# Patient Record
Sex: Male | Born: 1980
Health system: Southern US, Community
[De-identification: ages and names within clinical notes are randomized; demographics above are authoritative.]

## PROBLEM LIST (undated history)

## (undated) DIAGNOSIS — E119 Type 2 diabetes mellitus without complications: Secondary | ICD-10-CM

## (undated) DIAGNOSIS — K219 Gastro-esophageal reflux disease without esophagitis: Secondary | ICD-10-CM

## (undated) DIAGNOSIS — E785 Hyperlipidemia, unspecified: Secondary | ICD-10-CM

## (undated) DIAGNOSIS — I10 Essential (primary) hypertension: Secondary | ICD-10-CM

## (undated) HISTORY — DX: Type 2 diabetes mellitus without complications: E11.9

## (undated) HISTORY — PX: OTHER SURGICAL HISTORY: SHX169

## (undated) HISTORY — DX: Hyperlipidemia, unspecified: E78.5

## (undated) HISTORY — PX: APPENDECTOMY: SHX54

---
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
## 1999-04-10 ENCOUNTER — Emergency Department (HOSPITAL_COMMUNITY): Admission: EM | Admit: 1999-04-10 | Discharge: 1999-04-10 | Payer: Self-pay | Admitting: Emergency Medicine

## 1999-04-10 ENCOUNTER — Encounter: Payer: Self-pay | Admitting: Emergency Medicine

## 2001-11-17 ENCOUNTER — Emergency Department (HOSPITAL_COMMUNITY): Admission: EM | Admit: 2001-11-17 | Discharge: 2001-11-17 | Payer: Self-pay | Admitting: Emergency Medicine

## 2001-11-17 ENCOUNTER — Encounter: Payer: Self-pay | Admitting: Emergency Medicine

## 2010-06-28 ENCOUNTER — Emergency Department (HOSPITAL_COMMUNITY): Payer: PRIVATE HEALTH INSURANCE

## 2010-06-28 ENCOUNTER — Emergency Department (HOSPITAL_COMMUNITY)
Admission: EM | Admit: 2010-06-28 | Discharge: 2010-06-28 | Disposition: A | Discharge status: Home / Self Care | Payer: PRIVATE HEALTH INSURANCE | Attending: Emergency Medicine | Admitting: Emergency Medicine

## 2010-06-28 DIAGNOSIS — X58XXXA Exposure to other specified factors, initial encounter: Secondary | ICD-10-CM | POA: Insufficient documentation

## 2010-06-28 DIAGNOSIS — S838X9A Sprain of other specified parts of unspecified knee, initial encounter: Secondary | ICD-10-CM | POA: Insufficient documentation

## 2010-06-28 DIAGNOSIS — S86819A Strain of other muscle(s) and tendon(s) at lower leg level, unspecified leg, initial encounter: Secondary | ICD-10-CM | POA: Insufficient documentation

## 2010-06-28 DIAGNOSIS — Y9302 Activity, running: Secondary | ICD-10-CM | POA: Insufficient documentation

## 2010-06-28 DIAGNOSIS — M25569 Pain in unspecified knee: Secondary | ICD-10-CM | POA: Insufficient documentation

## 2010-08-16 ENCOUNTER — Emergency Department (HOSPITAL_COMMUNITY)
Admission: EM | Admit: 2010-08-16 | Discharge: 2010-08-17 | Disposition: A | Discharge status: Home / Self Care | Payer: PRIVATE HEALTH INSURANCE | Attending: Emergency Medicine | Admitting: Emergency Medicine

## 2010-08-16 ENCOUNTER — Emergency Department (HOSPITAL_COMMUNITY): Payer: PRIVATE HEALTH INSURANCE

## 2010-08-16 DIAGNOSIS — W269XXA Contact with unspecified sharp object(s), initial encounter: Secondary | ICD-10-CM | POA: Insufficient documentation

## 2010-08-16 DIAGNOSIS — Y93G1 Activity, food preparation and clean up: Secondary | ICD-10-CM | POA: Insufficient documentation

## 2010-08-16 DIAGNOSIS — S61209A Unspecified open wound of unspecified finger without damage to nail, initial encounter: Secondary | ICD-10-CM | POA: Insufficient documentation

## 2012-08-26 LAB — HM DIABETES EYE EXAM

## 2013-05-16 ENCOUNTER — Ambulatory Visit: Payer: PRIVATE HEALTH INSURANCE | Admitting: Physician Assistant

## 2013-05-17 ENCOUNTER — Ambulatory Visit: Payer: PRIVATE HEALTH INSURANCE | Admitting: Physician Assistant

## 2013-05-27 ENCOUNTER — Encounter: Payer: Self-pay | Admitting: Internal Medicine

## 2013-05-27 ENCOUNTER — Ambulatory Visit (INDEPENDENT_AMBULATORY_CARE_PROVIDER_SITE_OTHER): Payer: PRIVATE HEALTH INSURANCE | Admitting: Internal Medicine

## 2013-05-27 ENCOUNTER — Other Ambulatory Visit (INDEPENDENT_AMBULATORY_CARE_PROVIDER_SITE_OTHER): Payer: PRIVATE HEALTH INSURANCE

## 2013-05-27 VITALS — BP 138/80 | HR 108 | Temp 97.0°F | Resp 16 | Ht 72.0 in | Wt 215.0 lb

## 2013-05-27 DIAGNOSIS — E119 Type 2 diabetes mellitus without complications: Secondary | ICD-10-CM

## 2013-05-27 DIAGNOSIS — E139 Other specified diabetes mellitus without complications: Secondary | ICD-10-CM | POA: Insufficient documentation

## 2013-05-27 LAB — CBC WITH DIFFERENTIAL/PLATELET
Basophils Absolute: 0 10*3/uL (ref 0.0–0.1)
Basophils Relative: 0.4 % (ref 0.0–3.0)
EOS ABS: 0 10*3/uL (ref 0.0–0.7)
Eosinophils Relative: 0.3 % (ref 0.0–5.0)
HCT: 45.3 % (ref 39.0–52.0)
Hemoglobin: 15.3 g/dL (ref 13.0–17.0)
Lymphocytes Relative: 31.6 % (ref 12.0–46.0)
Lymphs Abs: 2.7 10*3/uL (ref 0.7–4.0)
MCHC: 33.7 g/dL (ref 30.0–36.0)
MCV: 90.6 fl (ref 78.0–100.0)
Monocytes Absolute: 0.7 10*3/uL (ref 0.1–1.0)
Monocytes Relative: 8.2 % (ref 3.0–12.0)
NEUTROS PCT: 59.5 % (ref 43.0–77.0)
Neutro Abs: 5.1 10*3/uL (ref 1.4–7.7)
PLATELETS: 299 10*3/uL (ref 150.0–400.0)
RBC: 5 Mil/uL (ref 4.22–5.81)
RDW: 12.3 % (ref 11.5–14.6)
WBC: 8.6 10*3/uL (ref 4.5–10.5)

## 2013-05-27 LAB — URINALYSIS, ROUTINE W REFLEX MICROSCOPIC
Bilirubin Urine: NEGATIVE
HGB URINE DIPSTICK: NEGATIVE
Ketones, ur: 40 — AB
Leukocytes, UA: NEGATIVE
NITRITE: NEGATIVE
SPECIFIC GRAVITY, URINE: 1.01 (ref 1.000–1.030)
TOTAL PROTEIN, URINE-UPE24: NEGATIVE
Urine Glucose: >=1000 — AB
Urobilinogen, UA: 0.2 (ref 0.0–1.0)
pH: 6 (ref 5.0–8.0)

## 2013-05-27 LAB — COMPREHENSIVE METABOLIC PANEL
ALBUMIN: 4.1 g/dL (ref 3.5–5.2)
ALT: 33 U/L (ref 0–53)
AST: 18 U/L (ref 0–37)
Alkaline Phosphatase: 112 U/L (ref 39–117)
BUN: 13 mg/dL (ref 6–23)
CO2: 25 mEq/L (ref 19–32)
Calcium: 9.6 mg/dL (ref 8.4–10.5)
Chloride: 98 mEq/L (ref 96–112)
Creatinine, Ser: 0.9 mg/dL (ref 0.4–1.5)
GFR: 127.34 mL/min (ref >60.00)
GLUCOSE: 385 mg/dL — AB (ref 70–99)
POTASSIUM: 3.9 meq/L (ref 3.5–5.1)
SODIUM: 133 meq/L — AB (ref 135–145)
TOTAL PROTEIN: 8 g/dL (ref 6.0–8.3)
Total Bilirubin: 0.8 mg/dL (ref 0.3–1.2)

## 2013-05-27 LAB — LIPID PANEL
Cholesterol: 242 mg/dL — ABNORMAL HIGH (ref 0–200)
HDL: 38.4 mg/dL — ABNORMAL LOW (ref >39.00)
LDL Cholesterol: 170 mg/dL — ABNORMAL HIGH (ref 0–99)
Total CHOL/HDL Ratio: 6
Triglycerides: 166 mg/dL — ABNORMAL HIGH (ref 0.0–149.0)
VLDL: 33.2 mg/dL (ref 0.0–40.0)

## 2013-05-27 LAB — HEMOGLOBIN A1C: Hgb A1c MFr Bld: 17.9 % — ABNORMAL HIGH (ref 4.6–6.5)

## 2013-05-27 LAB — TSH: TSH: 1.14 u[IU]/mL (ref 0.35–5.50)

## 2013-05-27 MED ORDER — BAYER CONTOUR NEXT EZ W/DEVICE KIT: 1.0000 | PACK | Freq: Three times a day (TID) | Status: DC

## 2013-05-27 MED ORDER — GLUCOSE BLOOD VI STRP: ORAL_STRIP | Status: DC

## 2013-05-27 MED ORDER — INSULIN LISPRO PROT & LISPRO (75-25 MIX) 100 UNIT/ML KWIKPEN: 40.0000 [IU] | PEN_INJECTOR | Freq: Two times a day (BID) | SUBCUTANEOUS | Status: DC

## 2013-05-27 NOTE — Progress Notes (Signed)
   Subjective:    Patient ID: Joseph [name], male    DOB: 06-18-1980, 33 y.o.   MRN: [id number]  Diabetes He presents for his initial diabetic visit. He has type 1 diabetes mellitus. The initial diagnosis of diabetes was made 3 months ago. His disease course has been worsening. Pertinent negatives for hypoglycemia include no dizziness, headaches or tremors. Associated symptoms include polydipsia, polyphagia, polyuria and weight loss. Pertinent negatives for diabetes include no blurred vision, no chest pain, no fatigue, no foot paresthesias, no foot ulcerations, no visual change and no weakness. There are no hypoglycemic complications. There are no diabetic complications. When asked about current treatments, none were reported. His weight is decreasing steadily. He is following a generally healthy diet. Meal planning includes avoidance of concentrated sweets. An ACE inhibitor/angiotensin II receptor blocker is not being taken. He does not see a podiatrist.Eye exam is not current.      Review of Systems  Constitutional: Positive for weight loss and unexpected weight change. Negative for fever, chills, diaphoresis, activity change, appetite change and fatigue.  HENT: Negative.   Eyes: Negative.  Negative for blurred vision.  Respiratory: Negative.  Negative for cough, choking, chest tightness, shortness of breath, wheezing and stridor.   Cardiovascular: Negative.  Negative for chest pain, palpitations and leg swelling.  Gastrointestinal: Negative.  Negative for nausea, vomiting, abdominal pain, diarrhea and constipation.  Endocrine: Positive for polydipsia, polyphagia and polyuria.  Genitourinary: Positive for frequency. Negative for dysuria, urgency, hematuria, flank pain, decreased urine volume, enuresis and difficulty urinating.  Musculoskeletal: Negative.   Skin: Negative.   Allergic/Immunologic: Negative.   Neurological: Negative.  Negative for dizziness, tremors, facial asymmetry, weakness,  light-headedness, numbness and headaches.  Hematological: Negative.  Negative for adenopathy. Does not bruise/bleed easily.  Psychiatric/Behavioral: Negative.        Objective:   Physical Exam  Vitals reviewed. Constitutional: He is oriented to person, place, and time. He appears well-developed and well-nourished. No distress.  HENT:  Head: Normocephalic and atraumatic.  Mouth/Throat: Oropharynx is clear and moist. No oropharyngeal exudate.  Eyes: Conjunctivae are normal. Right eye exhibits no discharge. Left eye exhibits no discharge. No scleral icterus.  Neck: Normal range of motion. Neck supple. No JVD present. No tracheal deviation present. No thyromegaly present.  Cardiovascular: Normal rate, regular rhythm, normal heart sounds and intact distal pulses.  Exam reveals no gallop and no friction rub.   No murmur heard. Pulmonary/Chest: Effort normal and breath sounds normal. No stridor. No respiratory distress. He has no wheezes. He has no rales. He exhibits no tenderness.  Abdominal: Soft. Bowel sounds are normal. He exhibits no distension and no mass. There is no tenderness. There is no rebound and no guarding.  Musculoskeletal: Normal range of motion. He exhibits no edema and no tenderness.  Lymphadenopathy:    He has no cervical adenopathy.  Neurological: He is oriented to person, place, and time.  Skin: Skin is warm and dry. No rash noted. He is not diaphoretic. No erythema. No pallor.  Psychiatric: He has a normal mood and affect. His behavior is normal. Judgment and thought content normal.     No results found for this basename: WBC, HGB, HCT, PLT, GLUCOSE, CHOL, TRIG, HDL, LDLDIRECT, LDLCALC, ALT, AST, NA, K, CL, CREATININE, BUN, CO2, TSH, PSA, INR, GLUF, HGBA1C, MICROALBUR       Assessment & Plan:

## 2013-05-27 NOTE — Progress Notes (Signed)
Pre visit review using our clinic review tool, if applicable. No additional management support is needed unless otherwise documented below in the visit note. 

## 2013-05-27 NOTE — Assessment & Plan Note (Signed)
-   his blood sugar is a 406 so I asked him to be admitted for blood sugar control but he refused that - I gave him his first dose of insulin in the office today and I showed him how to give himself insulin - I also gave him 2 machines to check his blood sugar and we showed him how to use the device - I will check his labs to monitor his lytes and renal function and a c-peptide to see if he is more off a type 1 or a type 2 - he was referred for an eye exam and for diabetic education

## 2013-05-27 NOTE — Patient Instructions (Signed)
Type 1 Diabetes Mellitus, Adult Type 1 diabetes mellitus, often simply referred to as diabetes, is a long-term (chronic) disease. It occurs when the islet cells in the pancreas that make insulin (a hormone) are destroyed and can no longer make insulin. Insulin is needed to move sugars from food into the tissue cells. The tissue cells use the sugars for energy. In people with type 1 diabetes, the sugars build up in the blood instead of going into the tissue cells. As a result, high blood sugar (hyperglycemia) develops. Without insulin, the body breaks down fat cells for the needed energy. This breakdown of fat cells produces acid chemicals (ketones), which increases the acid levels in the body. The effect of either high ketone or sugar (glucose) levels can be life-threatening.  Type 1 diabetes was also previously called juvenile diabetes. It most often occurs before the age of 30, but it can occur at any age. RISK FACTORS A person is predisposed to developing type 1 diabetes if someone in his or her family has the disease and is exposed to certain additional environmental triggers.  SYMPTOMS  Symptoms of type 1 diabetes may develop gradually over days to weeks or suddenly. The symptoms occur due to hyperglycemia. The symptoms can include:   Increased thirst (polydipsia).  Increased urination (polyuria).  Increased urination during the night (nocturia).  Weight loss. This weight loss may be rapid.  Frequent, recurring infections.  Tiredness (fatigue).  Weakness.  Vision changes, such as blurred vision.  Fruity smell to your breath.  Abdominal pain.  Nausea or vomiting. DIAGNOSIS  Type 1 diabetes is diagnosed when symptoms of diabetes are present and when blood glucose levels are increased. Your blood glucose level may be checked by one or more of the following blood tests:  A fasting blood glucose test. You will not be allowed to eat for at least 8 hours before a blood sample is  taken.  A random blood glucose test. Your blood glucose is checked at any time of the day regardless of when you ate.  A hemoglobin A1c blood glucose test. A hemoglobin A1c test provides information about blood glucose control over the previous 3 months. TREATMENT  Although type 1 diabetes cannot be prevented, it can be managed with insulin, diet, and exercise.  You will need to take insulin daily to keep blood glucose in the desired range.  You will need to match insulin dosing with exercise and healthy food choices. The treatment goal is to maintain the before-meal blood sugar (preprandial glucose) level at 70 130 mg/dL.  HOME CARE INSTRUCTIONS   Have your hemoglobin A1c level checked twice a year.  Perform daily blood glucose monitoring as directed by your caregiver.  Monitor urine ketones when you are ill and as directed by your caregiver.  Take your insulin as directed by your caregiver to maintain your blood glucose level in the desired range.  Never run out of insulin. It is needed every day.  Adjust insulin based on your intake of carbohydrates. Carbohydrates can raise blood glucose levels but need to be included in your diet. Carbohydrates provide vitamins, minerals, and fiber, which are an essential part of a healthy diet. Carbohydrates are found in fruits, vegetables, whole grains, dairy products, legumes, and foods containing added sugars.    Eat healthy foods. Alternate 3 meals with 3 snacks.  Maintain a healthy weight.  Carry a medical alert card or wear your medical alert jewelry.  Carry a 15 gram carbohydrate snack with you   at all times to treat low blood glucose (hypoglycemia). Some examples of 15 gram carbohydrate snacks include:  Glucose tablets, 3 or 4.   Glucose gel, 15 gram tube.  Raisins, 2 tablespoons (24 grams).  Jelly beans, 6.  Animal crackers, 8.  Fruit juice, regular soda, or low-fat milk, 4 ounces (120 mL).  Gummy treats,  9.    Recognize hypoglycemia. Hypoglycemia occurs with blood glucose levels of 70 mg/dL and below. The risk for hypoglycemia increases when fasting or skipping meals, during or after intense exercise, and during sleep. Hypoglycemia symptoms can include:  Tremors or shakes.  Decreased ability to concentrate.  Sweating.  Increased heart rate.  Headache.  Dry mouth.  Hunger.  Irritability.  Anxiety.  Restless sleep.  Altered speech or coordination.  Confusion.  Treat hypoglycemia promptly. If you are alert and able to safely swallow, follow the 15:15 rule:  Take 15 20 grams of rapid-acting glucose or carbohydrate. Rapid-acting options include glucose gel, glucose tablets, or 4 ounces (120 mL) of fruit juice, regular soda, or low-fat milk.  Check your blood glucose level 15 minutes after taking the glucose.   Take 15 20 grams more of glucose if the repeat blood glucose level is still 70 mg/dL or below.  Eat a meal or snack within 1 hour once blood glucose levels return to normal.  Be alert to polyuria and polydipsia, which are early signs of hyperglycemia. An early awareness of hyperglycemia allows for prompt treatment. Treat hyperglycemia as directed by your caregiver.  Engage in at least 150 minutes of moderate-intensity physical activity a week, spread over at least 3 days of the week or as directed by your caregiver.  Adjust your insulin dosing and food intake as needed if you start a new exercise or sport.  Follow your sick day plan at any time you are unable to eat or drink as usual.   Avoid tobacco use.  Limit alcohol intake to no more than 1 drink per day for nonpregnant women and 2 drinks per day for men. You should drink alcohol only when you are also eating food. Talk with your caregiver about whether alcohol is safe for you. Tell your caregiver if you drink alcohol several times a week.  Follow up with your caregiver regularly.  Schedule an eye exam  within 5 years of diagnosis and then annually.  Perform daily skin and foot care. Examine your skin and feet daily for cuts, bruises, redness, nail problems, bleeding, blisters, or sores. A foot exam by a caregiver should be done annually.  Brush your teeth and gums at least twice a day and floss at least once a day. Follow up with your dentist regularly.  Share your diabetes management plan with your workplace or school.  Stay up-to-date with immunizations.  Learn to manage stress.  Obtain ongoing diabetes education and support as needed.  Participate or seek rehabilitation as needed to maintain or improve independence and quality of life. Request a physical or occupational therapy referral if you are having foot or hand numbness or difficulties with grooming, dressing, eating, or physical activity. SEEK MEDICAL CARE IF:   You are unable to eat food or drink fluids for more than 6 hours.  You have nausea and vomiting for more than 6 hours.  Your blood glucose level is over 240 mg/dL.  There is a change in mental status.  You develop an additional serious illness.  You have diarrhea for more than 6 hours.  You have been   sick or have had a fever for a couple of days and are not getting better.  You have pain during any physical activity. SEEK IMMEDIATE MEDICAL CARE IF:  You have difficulty breathing.  You have moderate to large ketone levels. MAKE SURE YOU:  Understand these instructions.  Will watch your condition.  Will get help right away if you are not doing well or get worse. Document Released: 02/01/2000 Document Revised: 10/29/2011 Document Reviewed: 09/02/2011 [employer] Patient Information 2014 [employer].  

## 2013-05-28 LAB — C-PEPTIDE: C PEPTIDE: 0.95 ng/mL (ref 0.80–3.90)

## 2013-05-30 ENCOUNTER — Telehealth: Payer: Self-pay | Admitting: Internal Medicine

## 2013-05-30 NOTE — Telephone Encounter (Signed)
Relevant patient education assigned to patient using Emmi. ° °

## 2013-06-01 ENCOUNTER — Encounter: Payer: Self-pay | Admitting: Internal Medicine

## 2013-06-01 ENCOUNTER — Ambulatory Visit (INDEPENDENT_AMBULATORY_CARE_PROVIDER_SITE_OTHER): Payer: PRIVATE HEALTH INSURANCE | Admitting: Internal Medicine

## 2013-06-01 VITALS — BP 120/82 | HR 88 | Temp 97.9°F | Resp 16 | Ht 72.0 in | Wt 223.0 lb

## 2013-06-01 DIAGNOSIS — E785 Hyperlipidemia, unspecified: Secondary | ICD-10-CM | POA: Insufficient documentation

## 2013-06-01 DIAGNOSIS — Z23 Encounter for immunization: Secondary | ICD-10-CM

## 2013-06-01 DIAGNOSIS — E139 Other specified diabetes mellitus without complications: Secondary | ICD-10-CM

## 2013-06-01 DIAGNOSIS — E119 Type 2 diabetes mellitus without complications: Secondary | ICD-10-CM

## 2013-06-01 MED ORDER — ROSUVASTATIN CALCIUM 10 MG PO TABS: 10.0000 mg | ORAL_TABLET | Freq: Every day | ORAL | Status: DC

## 2013-06-01 MED ORDER — CANAGLIFLOZIN 300 MG PO TABS: 1.0000 | ORAL_TABLET | Freq: Every day | ORAL | Status: DC

## 2013-06-01 MED ORDER — INSULIN PEN NEEDLE 31G X 5 MM MISC: Status: DC

## 2013-06-01 NOTE — Assessment & Plan Note (Signed)
Will start a statin for this

## 2013-06-01 NOTE — Progress Notes (Signed)
Pre visit review using our clinic review tool, if applicable. No additional management support is needed unless otherwise documented below in the visit note. 

## 2013-06-01 NOTE — Progress Notes (Signed)
   Subjective:    Patient ID: Joseph [name], male    DOB: 12-20-1980, 33 y.o.   MRN: [id number]  HPI Comments: He returns for f/up after a new diagnosis of DM1. He feels better and has been able to put a few pounds back on. His blood sugars have been down to the 176-243 range.     Review of Systems  Constitutional: Negative.  Negative for fever, chills, diaphoresis, appetite change and fatigue.  HENT: Negative.   Eyes: Negative.   Respiratory: Negative.  Negative for cough, choking, chest tightness, shortness of breath, wheezing and stridor.   Cardiovascular: Negative.  Negative for chest pain, palpitations and leg swelling.  Gastrointestinal: Negative.  Negative for nausea, vomiting, abdominal pain, diarrhea, constipation and blood in stool.  Endocrine: Negative.  Negative for polydipsia, polyphagia and polyuria.  Genitourinary: Negative.   Musculoskeletal: Negative.   Skin: Negative.   Allergic/Immunologic: Negative.   Neurological: Negative.  Negative for dizziness, tremors, speech difficulty, weakness, light-headedness, numbness and headaches.  Hematological: Negative.  Negative for adenopathy. Does not bruise/bleed easily.  Psychiatric/Behavioral: Negative.        Objective:   Physical Exam  Vitals reviewed. Constitutional: He is oriented to person, place, and time. He appears well-developed and well-nourished. No distress.  HENT:  Head: Normocephalic and atraumatic.  Mouth/Throat: Oropharynx is clear and moist. No oropharyngeal exudate.  Eyes: Conjunctivae are normal. Right eye exhibits no discharge. Left eye exhibits no discharge. No scleral icterus.  Neck: Normal range of motion. Neck supple. No JVD present. No tracheal deviation present. No thyromegaly present.  Cardiovascular: Normal rate, regular rhythm, normal heart sounds and intact distal pulses.  Exam reveals no gallop and no friction rub.   No murmur heard. Pulmonary/Chest: Effort normal and breath sounds normal.  No stridor. No respiratory distress. He has no wheezes. He has no rales. He exhibits no tenderness.  Abdominal: Soft. Bowel sounds are normal. He exhibits no distension and no mass. There is no tenderness. There is no rebound and no guarding.  Musculoskeletal: Normal range of motion. He exhibits no edema and no tenderness.  Lymphadenopathy:    He has no cervical adenopathy.  Neurological: He is oriented to person, place, and time.  Skin: Skin is warm and dry. No rash noted. He is not diaphoretic. No erythema. No pallor.    Lab Results  Component Value Date   WBC 8.6 05/27/2013   HGB 15.3 05/27/2013   HCT 45.3 05/27/2013   PLT 299.0 05/27/2013   GLUCOSE 385* 05/27/2013   CHOL 242* 05/27/2013   TRIG 166.0* 05/27/2013   HDL 38.40* 05/27/2013   LDLCALC 170* 05/27/2013   ALT 33 05/27/2013   AST 18 05/27/2013   NA 133* 05/27/2013   K 3.9 05/27/2013   CL 98 05/27/2013   CREATININE 0.9 05/27/2013   BUN 13 05/27/2013   CO2 25 05/27/2013   TSH 1.14 05/27/2013   HGBA1C 17.9* 05/27/2013        Assessment & Plan:

## 2013-06-01 NOTE — Patient Instructions (Signed)
Type 2 Diabetes Mellitus, Adult Type 2 diabetes mellitus, often simply referred to as type 2 diabetes, is a long-lasting (chronic) disease. In type 2 diabetes, the pancreas does not make enough insulin (a hormone), the cells are less responsive to the insulin that is made (insulin resistance), or both. Normally, insulin moves sugars from food into the tissue cells. The tissue cells use the sugars for energy. The lack of insulin or the lack of normal response to insulin causes excess sugars to build up in the blood instead of going into the tissue cells. As a result, high blood sugar (hyperglycemia) develops. The effect of high sugar (glucose) levels can cause many complications. Type 2 diabetes was also previously called adult-onset diabetes but it can occur at any age.  RISK FACTORS  A person is predisposed to developing type 2 diabetes if someone in the family has the disease and also has one or more of the following primary risk factors:  Overweight.  An inactive lifestyle.  A history of consistently eating high-calorie foods. Maintaining a normal weight and regular physical activity can reduce the chance of developing type 2 diabetes. SYMPTOMS  A person with type 2 diabetes may not show symptoms initially. The symptoms of type 2 diabetes appear slowly. The symptoms include:  Increased thirst (polydipsia).  Increased urination (polyuria).  Increased urination during the night (nocturia).  Weight loss. This weight loss may be rapid.  Frequent, recurring infections.  Tiredness (fatigue).  Weakness.  Vision changes, such as blurred vision.  Fruity smell to your breath.  Abdominal pain.  Nausea or vomiting.  Cuts or bruises which are slow to heal.  Tingling or numbness in the hands or feet. DIAGNOSIS Type 2 diabetes is frequently not diagnosed until complications of diabetes are present. Type 2 diabetes is diagnosed when symptoms or complications are present and when blood  glucose levels are increased. Your blood glucose level may be checked by one or more of the following blood tests:  A fasting blood glucose test. You will not be allowed to eat for at least 8 hours before a blood sample is taken.  A random blood glucose test. Your blood glucose is checked at any time of the day regardless of when you ate.  A hemoglobin A1c blood glucose test. A hemoglobin A1c test provides information about blood glucose control over the previous 3 months.  An oral glucose tolerance test (OGTT). Your blood glucose is measured after you have not eaten (fasted) for 2 hours and then after you drink a glucose-containing beverage. TREATMENT   You may need to take insulin or diabetes medicine daily to keep blood glucose levels in the desired range.  You will need to match insulin dosing with exercise and healthy food choices. The treatment goal is to maintain the before meal blood sugar (preprandial glucose) level at 70 130 mg/dL. HOME CARE INSTRUCTIONS   Have your hemoglobin A1c level checked twice a year.  Perform daily blood glucose monitoring as directed by your caregiver.  Monitor urine ketones when you are ill and as directed by your caregiver.  Take your diabetes medicine or insulin as directed by your caregiver to maintain your blood glucose levels in the desired range.  Never run out of diabetes medicine or insulin. It is needed every day.  Adjust insulin based on your intake of carbohydrates. Carbohydrates can raise blood glucose levels but need to be included in your diet. Carbohydrates provide vitamins, minerals, and fiber which are an essential part of   a healthy diet. Carbohydrates are found in fruits, vegetables, whole grains, dairy products, legumes, and foods containing added sugars.    Eat healthy foods. Alternate 3 meals with 3 snacks.  Lose weight if overweight.  Carry a medical alert card or wear your medical alert jewelry.  Carry a 15 gram  carbohydrate snack with you at all times to treat low blood glucose (hypoglycemia). Some examples of 15 gram carbohydrate snacks include:  Glucose tablets, 3 or 4   Glucose gel, 15 gram tube  Raisins, 2 tablespoons (24 grams)  Jelly beans, 6  Animal crackers, 8  Regular pop, 4 ounces (120 mL)  Gummy treats, 9  Recognize hypoglycemia. Hypoglycemia occurs with blood glucose levels of 70 mg/dL and below. The risk for hypoglycemia increases when fasting or skipping meals, during or after intense exercise, and during sleep. Hypoglycemia symptoms can include:  Tremors or shakes.  Decreased ability to concentrate.  Sweating.  Increased heart rate.  Headache.  Dry mouth.  Hunger.  Irritability.  Anxiety.  Restless sleep.  Altered speech or coordination.  Confusion.  Treat hypoglycemia promptly. If you are alert and able to safely swallow, follow the 15:15 rule:  Take 15 20 grams of rapid-acting glucose or carbohydrate. Rapid-acting options include glucose gel, glucose tablets, or 4 ounces (120 mL) of fruit juice, regular soda, or low fat milk.  Check your blood glucose level 15 minutes after taking the glucose.  Take 15 20 grams more of glucose if the repeat blood glucose level is still 70 mg/dL or below.  Eat a meal or snack within 1 hour once blood glucose levels return to normal.    Be alert to polyuria and polydipsia which are early signs of hyperglycemia. An early awareness of hyperglycemia allows for prompt treatment. Treat hyperglycemia as directed by your caregiver.  Engage in at least 150 minutes of moderate-intensity physical activity a week, spread over at least 3 days of the week or as directed by your caregiver. In addition, you should engage in resistance exercise at least 2 times a week or as directed by your caregiver.  Adjust your medicine and food intake as needed if you start a new exercise or sport.  Follow your sick day plan at any time you  are unable to eat or drink as usual.  Avoid tobacco use.  Limit alcohol intake to no more than 1 drink per day for nonpregnant women and 2 drinks per day for men. You should drink alcohol only when you are also eating food. Talk with your caregiver whether alcohol is safe for you. Tell your caregiver if you drink alcohol several times a week.  Follow up with your caregiver regularly.  Schedule an eye exam soon after the diagnosis of type 2 diabetes and then annually.  Perform daily skin and foot care. Examine your skin and feet daily for cuts, bruises, redness, nail problems, bleeding, blisters, or sores. A foot exam by a caregiver should be done annually.  Brush your teeth and gums at least twice a day and floss at least once a day. Follow up with your dentist regularly.  Share your diabetes management plan with your workplace or school.  Stay up-to-date with immunizations.  Learn to manage stress.  Obtain ongoing diabetes education and support as needed.  Participate in, or seek rehabilitation as needed to maintain or improve independence and quality of life. Request a physical or occupational therapy referral if you are having foot or hand numbness or difficulties with grooming,   dressing, eating, or physical activity. SEEK MEDICAL CARE IF:   You are unable to eat food or drink fluids for more than 6 hours.  You have nausea and vomiting for more than 6 hours.  Your blood glucose level is over 240 mg/dL.  There is a change in mental status.  You develop an additional serious illness.  You have diarrhea for more than 6 hours.  You have been sick or have had a fever for a couple of days and are not getting better.  You have pain during any physical activity.  SEEK IMMEDIATE MEDICAL CARE IF:  You have difficulty breathing.  You have moderate to large ketone levels. MAKE SURE YOU:  Understand these instructions.  Will watch your condition.  Will get help right away if  you are not doing well or get worse. Document Released: 02/03/2005 Document Revised: 10/29/2011 Document Reviewed: 09/02/2011 [employer] Patient Information 2014 [employer].  

## 2013-06-01 NOTE — Assessment & Plan Note (Signed)
Improvement noted I think he would benefit from taking invokana He will stay on the current dose of humalog mix

## 2013-06-03 ENCOUNTER — Telehealth: Payer: Self-pay | Admitting: Internal Medicine

## 2013-06-03 NOTE — Telephone Encounter (Signed)
Patient states that he went to pick up needles for his insulin pen and they told him there was something else he needed but not sure what it is.  Wanted to know if Dr. Yetta BarreJones might know.  Also one of his pens are jammed and he wanted to know if there was a way to unjam the pen .

## 2013-06-06 MED ORDER — INSULIN PEN NEEDLE 31G X 5 MM MISC: Status: DC

## 2013-06-06 NOTE — Telephone Encounter (Signed)
LMOVM

## 2013-06-14 ENCOUNTER — Telehealth: Payer: Self-pay

## 2013-06-14 NOTE — Telephone Encounter (Signed)
Relevant patient education assigned to patient using Emmi. ° °

## 2013-06-17 ENCOUNTER — Telehealth: Payer: Self-pay | Admitting: Internal Medicine

## 2013-06-17 NOTE — Telephone Encounter (Signed)
Patient Information:  Caller Name: [name]  Phone: [phone]  Patient: Joseph [name], Joseph [name]  Gender: Male  DOB: 08-02-1980  Age: 33 Years  PCP: Sanda LingerJones, Thomas (Adults only)  Office Follow Up:  Does the office need to follow up with this patient?: Yes  Instructions For The Office: If you have information on where he can get glucometer strips, or if they can be called into his pharmacy, he can continue doing blood sugar checks so he can monitor his blood sugars. Plans to be sleeping during today 5/1, can leave message on phone [phone].  RN Note:  Uses Bare Contour Next EX Glucometer and having problems finding glucometer strips for it.  U[facility] [address]. Triaged in Eye pain or vision change off-line Guideline - See Provider within 24 hours due to new medication added just before eye changes started. Just came off 3rd shift and plans call to office later today to schedule appointment for tomorrow.  Symptoms  Reason For Call & Symptoms: Seen in office for first time 4/10 with extreme thirst, weight loss, Dx diabetes amd started Humalog Insulin.  Seen again  4/.18 and had gained 7-8 Lbs.  Now up to 237.5 so has weight gain now of 17 Lbs.  Has vision change and even with contacts vision is blurred.  Notes lateral right foot soreness at little toe  described as dull constant ache. Has had diet change,  blood sugars have gone from 406 to 90-125 the last time he checked blood sugars 2 days ago.  Ran out of glucometer strips so not checking blood sugars now.  Reviewed Health History In EMR: Yes  Reviewed Medications In EMR: Yes  Reviewed Allergies In EMR: Yes  Reviewed Surgeries / Procedures: No  Date of Onset of Symptoms: 05/27/2013  Guideline(s) Used:  No Protocol Available - Sick Adult  Disposition Per Guideline:   See Today or Tomorrow in Office  Reason For Disposition Reached:   Nursing judgment  Advice Given:  N/A  Patient Will Follow Care Advice:  YES

## 2013-06-20 ENCOUNTER — Encounter: Payer: Self-pay | Admitting: Internal Medicine

## 2013-06-20 ENCOUNTER — Other Ambulatory Visit: Payer: Self-pay | Admitting: *Deleted

## 2013-06-20 ENCOUNTER — Ambulatory Visit (INDEPENDENT_AMBULATORY_CARE_PROVIDER_SITE_OTHER): Payer: PRIVATE HEALTH INSURANCE | Admitting: Internal Medicine

## 2013-06-20 ENCOUNTER — Ambulatory Visit (INDEPENDENT_AMBULATORY_CARE_PROVIDER_SITE_OTHER)
Admission: RE | Admit: 2013-06-20 | Discharge: 2013-06-20 | Disposition: A | Discharge status: Home / Self Care | Payer: PRIVATE HEALTH INSURANCE | Source: Ambulatory Visit | Attending: Internal Medicine | Admitting: Internal Medicine

## 2013-06-20 VITALS — BP 138/88 | HR 72 | Temp 98.1°F | Resp 18 | Wt 231.0 lb

## 2013-06-20 DIAGNOSIS — M79671 Pain in right foot: Secondary | ICD-10-CM

## 2013-06-20 DIAGNOSIS — M79609 Pain in unspecified limb: Secondary | ICD-10-CM

## 2013-06-20 DIAGNOSIS — E119 Type 2 diabetes mellitus without complications: Secondary | ICD-10-CM

## 2013-06-20 DIAGNOSIS — E139 Other specified diabetes mellitus without complications: Secondary | ICD-10-CM

## 2013-06-20 LAB — GLUCOSE, POCT (MANUAL RESULT ENTRY): POC GLUCOSE: 80 mg/dL (ref 70–99)

## 2013-06-20 NOTE — Assessment & Plan Note (Signed)
The exam is normal, I will check a plain film to see if there is an occult fracture, djd, spur. etc

## 2013-06-20 NOTE — Assessment & Plan Note (Signed)
He has non-specific s/s, perhaps his blood sugars have been corrected too quickly or he may be having side effects to invokana. Will stop invokana. I have encouraged him to see diabetic education and to have his annual eye exam done.

## 2013-06-20 NOTE — Patient Instructions (Signed)
Type 1 Diabetes Mellitus, Adult Type 1 diabetes mellitus, often simply referred to as diabetes, is a long-term (chronic) disease. It occurs when the islet cells in the pancreas that make insulin (a hormone) are destroyed and can no longer make insulin. Insulin is needed to move sugars from food into the tissue cells. The tissue cells use the sugars for energy. In people with type 1 diabetes, the sugars build up in the blood instead of going into the tissue cells. As a result, high blood sugar (hyperglycemia) develops. Without insulin, the body breaks down fat cells for the needed energy. This breakdown of fat cells produces acid chemicals (ketones), which increases the acid levels in the body. The effect of either high ketone or sugar (glucose) levels can be life-threatening.  Type 1 diabetes was also previously called juvenile diabetes. It most often occurs before the age of 30, but it can occur at any age. RISK FACTORS A person is predisposed to developing type 1 diabetes if someone in his or her family has the disease and is exposed to certain additional environmental triggers.  SYMPTOMS  Symptoms of type 1 diabetes may develop gradually over days to weeks or suddenly. The symptoms occur due to hyperglycemia. The symptoms can include:   Increased thirst (polydipsia).  Increased urination (polyuria).  Increased urination during the night (nocturia).  Weight loss. This weight loss may be rapid.  Frequent, recurring infections.  Tiredness (fatigue).  Weakness.  Vision changes, such as blurred vision.  Fruity smell to your breath.  Abdominal pain.  Nausea or vomiting. DIAGNOSIS  Type 1 diabetes is diagnosed when symptoms of diabetes are present and when blood glucose levels are increased. Your blood glucose level may be checked by one or more of the following blood tests:  A fasting blood glucose test. You will not be allowed to eat for at least 8 hours before a blood sample is  taken.  A random blood glucose test. Your blood glucose is checked at any time of the day regardless of when you ate.  A hemoglobin A1c blood glucose test. A hemoglobin A1c test provides information about blood glucose control over the previous 3 months. TREATMENT  Although type 1 diabetes cannot be prevented, it can be managed with insulin, diet, and exercise.  You will need to take insulin daily to keep blood glucose in the desired range.  You will need to match insulin dosing with exercise and healthy food choices. The treatment goal is to maintain the before-meal blood sugar (preprandial glucose) level at 70 130 mg/dL.  HOME CARE INSTRUCTIONS   Have your hemoglobin A1c level checked twice a year.  Perform daily blood glucose monitoring as directed by your caregiver.  Monitor urine ketones when you are ill and as directed by your caregiver.  Take your insulin as directed by your caregiver to maintain your blood glucose level in the desired range.  Never run out of insulin. It is needed every day.  Adjust insulin based on your intake of carbohydrates. Carbohydrates can raise blood glucose levels but need to be included in your diet. Carbohydrates provide vitamins, minerals, and fiber, which are an essential part of a healthy diet. Carbohydrates are found in fruits, vegetables, whole grains, dairy products, legumes, and foods containing added sugars.    Eat healthy foods. Alternate 3 meals with 3 snacks.  Maintain a healthy weight.  Carry a medical alert card or wear your medical alert jewelry.  Carry a 15 gram carbohydrate snack with you   at all times to treat low blood glucose (hypoglycemia). Some examples of 15 gram carbohydrate snacks include:  Glucose tablets, 3 or 4.   Glucose gel, 15 gram tube.  Raisins, 2 tablespoons (24 grams).  Jelly beans, 6.  Animal crackers, 8.  Fruit juice, regular soda, or low-fat milk, 4 ounces (120 mL).  Gummy treats,  9.    Recognize hypoglycemia. Hypoglycemia occurs with blood glucose levels of 70 mg/dL and below. The risk for hypoglycemia increases when fasting or skipping meals, during or after intense exercise, and during sleep. Hypoglycemia symptoms can include:  Tremors or shakes.  Decreased ability to concentrate.  Sweating.  Increased heart rate.  Headache.  Dry mouth.  Hunger.  Irritability.  Anxiety.  Restless sleep.  Altered speech or coordination.  Confusion.  Treat hypoglycemia promptly. If you are alert and able to safely swallow, follow the 15:15 rule:  Take 15 20 grams of rapid-acting glucose or carbohydrate. Rapid-acting options include glucose gel, glucose tablets, or 4 ounces (120 mL) of fruit juice, regular soda, or low-fat milk.  Check your blood glucose level 15 minutes after taking the glucose.   Take 15 20 grams more of glucose if the repeat blood glucose level is still 70 mg/dL or below.  Eat a meal or snack within 1 hour once blood glucose levels return to normal.  Be alert to polyuria and polydipsia, which are early signs of hyperglycemia. An early awareness of hyperglycemia allows for prompt treatment. Treat hyperglycemia as directed by your caregiver.  Engage in at least 150 minutes of moderate-intensity physical activity a week, spread over at least 3 days of the week or as directed by your caregiver.  Adjust your insulin dosing and food intake as needed if you start a new exercise or sport.  Follow your sick day plan at any time you are unable to eat or drink as usual.   Avoid tobacco use.  Limit alcohol intake to no more than 1 drink per day for nonpregnant women and 2 drinks per day for men. You should drink alcohol only when you are also eating food. Talk with your caregiver about whether alcohol is safe for you. Tell your caregiver if you drink alcohol several times a week.  Follow up with your caregiver regularly.  Schedule an eye exam  within 5 years of diagnosis and then annually.  Perform daily skin and foot care. Examine your skin and feet daily for cuts, bruises, redness, nail problems, bleeding, blisters, or sores. A foot exam by a caregiver should be done annually.  Brush your teeth and gums at least twice a day and floss at least once a day. Follow up with your dentist regularly.  Share your diabetes management plan with your workplace or school.  Stay up-to-date with immunizations.  Learn to manage stress.  Obtain ongoing diabetes education and support as needed.  Participate or seek rehabilitation as needed to maintain or improve independence and quality of life. Request a physical or occupational therapy referral if you are having foot or hand numbness or difficulties with grooming, dressing, eating, or physical activity. SEEK MEDICAL CARE IF:   You are unable to eat food or drink fluids for more than 6 hours.  You have nausea and vomiting for more than 6 hours.  Your blood glucose level is over 240 mg/dL.  There is a change in mental status.  You develop an additional serious illness.  You have diarrhea for more than 6 hours.  You have been   sick or have had a fever for a couple of days and are not getting better.  You have pain during any physical activity. SEEK IMMEDIATE MEDICAL CARE IF:  You have difficulty breathing.  You have moderate to large ketone levels. MAKE SURE YOU:  Understand these instructions.  Will watch your condition.  Will get help right away if you are not doing well or get worse. Document Released: 02/01/2000 Document Revised: 10/29/2011 Document Reviewed: 09/02/2011 [employer] Patient Information 2014 [employer].  

## 2013-06-20 NOTE — Progress Notes (Signed)
Subjective:    Patient ID: Joseph EveryMichael Bowsher, male    DOB: [birth year], 33 y.o.   MRN: [id number]  HPI Comments: He complains that invokana makes him feel dizzy and has affected his visual acuity.     Review of Systems  Constitutional: Negative.  Negative for fever, chills, diaphoresis, appetite change and fatigue.  HENT: Negative.   Eyes: Positive for visual disturbance. Negative for photophobia, pain, discharge, redness and itching.  Respiratory: Negative.  Negative for cough, choking, chest tightness, shortness of breath, wheezing and stridor.   Cardiovascular: Negative.  Negative for chest pain, palpitations and leg swelling.  Gastrointestinal: Negative.  Negative for nausea, vomiting, abdominal pain, diarrhea, constipation and blood in stool.  Endocrine: Negative.  Negative for polydipsia, polyphagia and polyuria.  Genitourinary: Negative.  Negative for dysuria, frequency, flank pain, decreased urine volume, enuresis and difficulty urinating.  Musculoskeletal: Positive for arthralgias. Negative for back pain, gait problem, joint swelling, myalgias, neck pain and neck stiffness.       Right foot pain for several months, the pain is located over the 5th metatarsal. He denies any recent injury or trauma..   Skin: Negative.   Allergic/Immunologic: Negative.   Neurological: Positive for dizziness. Negative for tremors, seizures, syncope, facial asymmetry, speech difficulty, weakness, light-headedness, numbness and headaches.  Hematological: Negative.  Negative for adenopathy. Does not bruise/bleed easily.  Psychiatric/Behavioral: Negative.        Objective:   Physical Exam  Vitals reviewed. Constitutional: He is oriented to person, place, and time. He appears well-developed and well-nourished. No distress.  HENT:  Head: Normocephalic and atraumatic.  Mouth/Throat: Oropharynx is clear and moist. No oropharyngeal exudate.  Eyes: Conjunctivae are normal. Right eye exhibits no discharge.  Left eye exhibits no discharge. No scleral icterus.  Neck: Normal range of motion. Neck supple. No JVD present. No tracheal deviation present. No thyromegaly present.  Cardiovascular: Normal rate, regular rhythm, normal heart sounds and intact distal pulses.  Exam reveals no gallop and no friction rub.   No murmur heard. Pulmonary/Chest: Effort normal and breath sounds normal. No stridor. No respiratory distress. He has no wheezes. He has no rales. He exhibits no tenderness.  Abdominal: Soft. Bowel sounds are normal. He exhibits no distension and no mass. There is no tenderness. There is no rebound and no guarding.  Musculoskeletal: Normal range of motion. He exhibits no edema and no tenderness.       Right foot: Normal. He exhibits normal range of motion, no tenderness, no bony tenderness, no swelling, normal capillary refill, no crepitus, no deformity and no laceration.  Lymphadenopathy:    He has no cervical adenopathy.  Neurological: He is oriented to person, place, and time.  Skin: Skin is warm and dry. No rash noted. He is not diaphoretic. No erythema. No pallor.  Psychiatric: He has a normal mood and affect. His behavior is normal. Judgment and thought content normal.     Lab Results  Component Value Date   WBC 8.6 05/27/2013   HGB 15.3 05/27/2013   HCT 45.3 05/27/2013   PLT 299.0 05/27/2013   GLUCOSE 385* 05/27/2013   CHOL 242* 05/27/2013   TRIG 166.0* 05/27/2013   HDL 38.40* 05/27/2013   LDLCALC 170* 05/27/2013   ALT 33 05/27/2013   AST 18 05/27/2013   NA 133* 05/27/2013   K 3.9 05/27/2013   CL 98 05/27/2013   CREATININE 0.9 05/27/2013   BUN 13 05/27/2013   CO2 25 05/27/2013   TSH 1.14 05/27/2013  HGBA1C 17.9* 05/27/2013       Assessment & Plan:

## 2013-06-20 NOTE — Progress Notes (Signed)
Pre-visit discussion using our clinic review tool, as applicable. No additional management support is needed unless otherwise documented below in the visit note.  

## 2013-06-20 NOTE — Telephone Encounter (Signed)
Message copied by Baldwin JamaicaJOHNSON, Jacelynn Hayton G on Mon Jun 20, 2013  4:28 PM ------      Message from: Etta GrandchildJONES, THOMAS L      Created: Mon Jun 20, 2013  4:15 PM       Please tell him that the xray of the foot showed arthritis and a bone spur over his heel ------

## 2013-07-01 ENCOUNTER — Telehealth: Payer: Self-pay | Admitting: Internal Medicine

## 2013-07-01 DIAGNOSIS — E139 Other specified diabetes mellitus without complications: Secondary | ICD-10-CM

## 2013-07-01 MED ORDER — INSULIN LISPRO PROT & LISPRO (75-25 MIX) 100 UNIT/ML KWIKPEN: 40.0000 [IU] | PEN_INJECTOR | Freq: Two times a day (BID) | SUBCUTANEOUS | Status: DC

## 2013-07-01 NOTE — Telephone Encounter (Signed)
lmovm advising ok to pick up samples.

## 2013-07-01 NOTE — Telephone Encounter (Signed)
Patient states that he is out of his insulin and his insurance does not begin until next week. Wants to know if samples are available for him. Please advise.

## 2013-07-13 ENCOUNTER — Encounter: Payer: Self-pay | Admitting: *Deleted

## 2013-07-13 ENCOUNTER — Encounter: Payer: PRIVATE HEALTH INSURANCE | Attending: Internal Medicine | Admitting: *Deleted

## 2013-07-13 VITALS — Ht 72.0 in | Wt 246.2 lb

## 2013-07-13 DIAGNOSIS — E109 Type 1 diabetes mellitus without complications: Secondary | ICD-10-CM | POA: Insufficient documentation

## 2013-07-13 DIAGNOSIS — E139 Other specified diabetes mellitus without complications: Secondary | ICD-10-CM

## 2013-07-13 DIAGNOSIS — Z833 Family history of diabetes mellitus: Secondary | ICD-10-CM | POA: Insufficient documentation

## 2013-07-13 DIAGNOSIS — Z713 Dietary counseling and surveillance: Secondary | ICD-10-CM | POA: Insufficient documentation

## 2013-07-13 NOTE — Patient Instructions (Signed)
Plan:  Aim for 4 Carb Choices per meal (60 grams) +/- 1 either way  Aim for 0-2 Carbs per snack if hungry  Include protein in moderation with your meals and snacks Consider reading food labels for Total Carbohydrate of foods Continue your activity level daily as tolerated Continue checking BG at alternate times per day as directed by MD  Continue taking insulin medication as directed by MD

## 2013-07-13 NOTE — Progress Notes (Signed)
Appt start time: 0900 end time:  1030.  Assessment:  Patient was seen on  07/13/13 for individual diabetes education. He was just diagnosed with diabetes in April, 2015. States strong family history, including some cousins and uncles with type 1. Weight loss of 40 pounds since end of last year. 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 He lives alone so he makes all food choices. He walks a lot at work and plays basket ball twice a week for 30-45 minutes.  Current HbA1c: 17.9%  Preferred Learning Style:   No preference indicated   Learning Readiness:   Ready  Change in progress  MEDICATIONS: see list, diabetes medication is Humalog 75/25  DIETARY INTAKE:  24-hr recall:  B ( 5 PM): hot meal, meat, occasional starch like rice, vegetables, salad often, water Snk ( AM): peanuts or sunflower seeds  L ( PM): varies, sandwich or more nuts Snk ( PM): fresh fruit occasionally D ( PM):  when gets off work 1-2 bowls of unsweet cereal with 2% milk, water Snk ( PM): no Beverages: water  Usual physical activity: basketball and walks at work  Estimated energy needs: 2100 calories 235 g carbohydrates 158 g protein 58 g fat  Progress Towards Goal(s):  In progress.   Nutritional Diagnosis:  NB-1.1 Food and nutrition-related knowledge deficit As related to new onset of diabetes.  As evidenced by A1c of 17.9%.    Intervention:  Nutrition counseling provided.  Discussed diabetes disease process and treatment options.  Discussed physiology of DM 1 and DM 2 diabetes  Encouraged moderate weight reduction to improve glucose levels.    Provided education on macronutrients on glucose levels.  Provided education on carb counting, importance of regularly scheduled meals/snacks, and meal planning  Discussed effects  of physical activity on glucose levels and long-term glucose control.  Recommended continued physical activity/week.  At next visit, plan to review patient medications including insulin action and to discuss role of medication on blood glucose and possible side effects  Discussed blood glucose monitoring and interpretation.  Discussed recommended target ranges and individual ranges.    Described short-term complications: hyper- and hypo-glycemia.  Discussed causes,symptoms, and treatment options.  Discussed prevention, detection, and treatment of long-term complications.  Discussed the role of prolonged elevated glucose levels on body systems.  Discussed role of stress on blood glucose levels and discussed strategies to manage psychosocial issues.  Discussed recommendations for long-term diabetes self-care.  Provided checklist for medical, dental, and emotional self-care.  Plan:  Aim for 4 Carb Choices per meal (60 grams) +/- 1 either way  Aim for 0-2 Carbs per snack if hungry  Include protein in moderation with your meals and snacks Consider reading food labels for Total Carbohydrate of foods Continue your activity level daily as tolerated Continue checking BG at alternate times per day as directed by MD  Continue taking insulin medication as directed by MD  Teaching Method Utilized: Visual, Auditory and Hands on  Handouts given during visit include: Living Well with Diabetes Carb Counting and Food Label handouts Meal Plan Card  Barriers to learning/adherence to lifestyle change: new onset of chronic disease  Diabetes self-care support plan:   [facility] DM 1 support group  Demonstrated degree of understanding via:  Teach Back   Monitoring/Evaluation:  Dietary intake, exercise, reading food labels, SMBG, and body weight in 1 month(s).

## 2013-08-15 ENCOUNTER — Ambulatory Visit: Payer: PRIVATE HEALTH INSURANCE | Admitting: *Deleted

## 2013-09-20 ENCOUNTER — Other Ambulatory Visit (INDEPENDENT_AMBULATORY_CARE_PROVIDER_SITE_OTHER): Payer: PRIVATE HEALTH INSURANCE

## 2013-09-20 ENCOUNTER — Encounter: Payer: Self-pay | Admitting: Internal Medicine

## 2013-09-20 ENCOUNTER — Ambulatory Visit (INDEPENDENT_AMBULATORY_CARE_PROVIDER_SITE_OTHER): Payer: PRIVATE HEALTH INSURANCE | Admitting: Internal Medicine

## 2013-09-20 VITALS — BP 122/76 | HR 86 | Temp 98.3°F | Resp 16 | Ht 72.0 in | Wt 258.0 lb

## 2013-09-20 DIAGNOSIS — E119 Type 2 diabetes mellitus without complications: Secondary | ICD-10-CM

## 2013-09-20 DIAGNOSIS — L853 Xerosis cutis: Secondary | ICD-10-CM

## 2013-09-20 DIAGNOSIS — E139 Other specified diabetes mellitus without complications: Secondary | ICD-10-CM

## 2013-09-20 DIAGNOSIS — E785 Hyperlipidemia, unspecified: Secondary | ICD-10-CM

## 2013-09-20 DIAGNOSIS — L738 Other specified follicular disorders: Secondary | ICD-10-CM

## 2013-09-20 LAB — COMPREHENSIVE METABOLIC PANEL WITH GFR
ALT: 35 U/L (ref 0–53)
AST: 24 U/L (ref 0–37)
Albumin: 4 g/dL (ref 3.5–5.2)
Alkaline Phosphatase: 70 U/L (ref 39–117)
BUN: 12 mg/dL (ref 6–23)
CO2: 29 meq/L (ref 19–32)
Calcium: 9.1 mg/dL (ref 8.4–10.5)
Chloride: 106 meq/L (ref 96–112)
Creatinine, Ser: 0.8 mg/dL (ref 0.4–1.5)
GFR: 134.11 mL/min
Glucose, Bld: 150 mg/dL — ABNORMAL HIGH (ref 70–99)
Potassium: 3.7 meq/L (ref 3.5–5.1)
Sodium: 139 meq/L (ref 135–145)
Total Bilirubin: 0.5 mg/dL (ref 0.2–1.2)
Total Protein: 7.6 g/dL (ref 6.0–8.3)

## 2013-09-20 LAB — URINALYSIS, ROUTINE W REFLEX MICROSCOPIC
Bilirubin Urine: NEGATIVE
Hgb urine dipstick: NEGATIVE
Ketones, ur: NEGATIVE
Leukocytes, UA: NEGATIVE
Nitrite: NEGATIVE
RBC / HPF: NONE SEEN
Specific Gravity, Urine: 1.025
Total Protein, Urine: NEGATIVE
Urine Glucose: 500 — AB
Urobilinogen, UA: 0.2
pH: 6 (ref 5.0–8.0)

## 2013-09-20 LAB — CBC WITH DIFFERENTIAL/PLATELET
Basophils Absolute: 0 K/uL (ref 0.0–0.1)
Basophils Relative: 0.3 % (ref 0.0–3.0)
Eosinophils Absolute: 0.3 K/uL (ref 0.0–0.7)
Eosinophils Relative: 4.1 % (ref 0.0–5.0)
HCT: 43.8 % (ref 39.0–52.0)
Hemoglobin: 15 g/dL (ref 13.0–17.0)
Lymphocytes Relative: 50.1 % — ABNORMAL HIGH (ref 12.0–46.0)
Lymphs Abs: 3.8 K/uL (ref 0.7–4.0)
MCHC: 34.2 g/dL (ref 30.0–36.0)
MCV: 90.3 fl (ref 78.0–100.0)
Monocytes Absolute: 0.7 K/uL (ref 0.1–1.0)
Monocytes Relative: 9.8 % (ref 3.0–12.0)
Neutro Abs: 2.7 K/uL (ref 1.4–7.7)
Neutrophils Relative %: 35.7 % — ABNORMAL LOW (ref 43.0–77.0)
Platelets: 278 K/uL (ref 150.0–400.0)
RBC: 4.85 Mil/uL (ref 4.22–5.81)
RDW: 12.5 % (ref 11.5–15.5)
WBC: 7.6 K/uL (ref 4.0–10.5)

## 2013-09-20 LAB — MICROALBUMIN / CREATININE URINE RATIO
Creatinine,U: 238.5 mg/dL
Microalb Creat Ratio: 0.4 mg/g (ref 0.0–30.0)
Microalb, Ur: 0.9 mg/dL (ref 0.0–1.9)

## 2013-09-20 LAB — LIPID PANEL
Cholesterol: 186 mg/dL (ref 0–200)
HDL: 44.4 mg/dL
LDL Cholesterol: 117 mg/dL — ABNORMAL HIGH (ref 0–99)
NonHDL: 141.6
Total CHOL/HDL Ratio: 4
Triglycerides: 121 mg/dL (ref 0.0–149.0)
VLDL: 24.2 mg/dL (ref 0.0–40.0)

## 2013-09-20 LAB — HEMOGLOBIN A1C: Hgb A1c MFr Bld: 8.8 % — ABNORMAL HIGH (ref 4.6–6.5)

## 2013-09-20 MED ORDER — INSULIN LISPRO PROT & LISPRO (75-25 MIX) 100 UNIT/ML KWIKPEN: 40.0000 [IU] | PEN_INJECTOR | Freq: Two times a day (BID) | SUBCUTANEOUS | Status: DC

## 2013-09-20 NOTE — Progress Notes (Signed)
   Subjective:    Patient ID: Joseph [name], male    DOB: 11/12/1980, 33 y.o.   MRN: [id number]  Diabetes He presents for his follow-up diabetic visit. He has type 1 diabetes mellitus. His disease course has been improving. There are no hypoglycemic associated symptoms. There are no diabetic associated symptoms. Pertinent negatives for diabetes include no blurred vision, no chest pain, no fatigue, no foot paresthesias, no foot ulcerations, no polydipsia, no polyphagia, no polyuria, no visual change, no weakness and no weight loss. There are no hypoglycemic complications. Symptoms are stable. There are no diabetic complications. His weight is increasing steadily. He is following a generally healthy diet. Meal planning includes avoidance of concentrated sweets. He has had a previous visit with a dietician. He participates in exercise intermittently. There is no change in his home blood glucose trend. An ACE inhibitor/angiotensin II receptor blocker is not being taken. He does not see a podiatrist.Eye exam is current.      Review of Systems  Constitutional: Negative for weight loss and fatigue.  Eyes: Negative for blurred vision.  Cardiovascular: Negative for chest pain.  Endocrine: Negative for polydipsia, polyphagia and polyuria.  Neurological: Negative for weakness.       Objective:   Physical Exam  Vitals reviewed. Constitutional: He is oriented to person, place, and time. He appears well-developed and well-nourished. No distress.  HENT:  Head: Normocephalic and atraumatic.  Mouth/Throat: Oropharynx is clear and moist. No oropharyngeal exudate.  Eyes: Conjunctivae are normal. Right eye exhibits no discharge. Left eye exhibits no discharge. No scleral icterus.  Neck: Normal range of motion. Neck supple. No JVD present. No tracheal deviation present. No thyromegaly present.  Cardiovascular: Normal rate, regular rhythm, normal heart sounds and intact distal pulses.  Exam reveals no gallop  and no friction rub.   No murmur heard. Pulmonary/Chest: Effort normal and breath sounds normal. No stridor. No respiratory distress. He has no wheezes. He has no rales. He exhibits no tenderness.  Abdominal: Soft. Bowel sounds are normal. He exhibits no distension and no mass. There is no tenderness. There is no rebound and no guarding.  Musculoskeletal: Normal range of motion. He exhibits no edema and no tenderness.  Lymphadenopathy:    He has no cervical adenopathy.  Neurological: He is oriented to person, place, and time.  Skin: Skin is warm and dry. No rash noted. He is not diaphoretic. No erythema. No pallor.     Lab Results  Component Value Date   WBC 8.6 05/27/2013   HGB 15.3 05/27/2013   HCT 45.3 05/27/2013   PLT 299.0 05/27/2013   GLUCOSE 385* 05/27/2013   CHOL 242* 05/27/2013   TRIG 166.0* 05/27/2013   HDL 38.40* 05/27/2013   LDLCALC 170* 05/27/2013   ALT 33 05/27/2013   AST 18 05/27/2013   NA 133* 05/27/2013   K 3.9 05/27/2013   CL 98 05/27/2013   CREATININE 0.9 05/27/2013   BUN 13 05/27/2013   CO2 25 05/27/2013   TSH 1.14 05/27/2013   HGBA1C 17.9* 05/27/2013       Assessment & Plan:

## 2013-09-20 NOTE — Assessment & Plan Note (Signed)
His LDL is much improved

## 2013-09-20 NOTE — Assessment & Plan Note (Signed)
His blood sugars are improved but could be better so I have asked him to increase the Humlog Mix to 60 units in the AM and 40 units in the PM

## 2013-09-20 NOTE — Progress Notes (Signed)
Pre visit review using our clinic review tool, if applicable. No additional management support is needed unless otherwise documented below in the visit note. 

## 2013-09-20 NOTE — Patient Instructions (Signed)

## 2013-09-21 DIAGNOSIS — L853 Xerosis cutis: Secondary | ICD-10-CM | POA: Insufficient documentation

## 2013-09-21 MED ORDER — AMMONIUM LACTATE 12 % EX CREA: TOPICAL_CREAM | CUTANEOUS | Status: DC | PRN

## 2013-09-21 NOTE — Addendum Note (Signed)
Addended by: Etta GrandchildJONES, Laureano Hetzer L on: 09/21/2013 12:02 PM   Modules accepted: Orders

## 2014-05-03 ENCOUNTER — Telehealth: Payer: Self-pay | Admitting: Internal Medicine

## 2014-05-03 NOTE — Telephone Encounter (Signed)
Patient will need to provide more information in order to decipher what is needed to ensure that we are able to advise or help him.

## 2014-05-03 NOTE — Telephone Encounter (Signed)
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 Please advise

## 2014-05-03 NOTE — Telephone Encounter (Signed)
Pt called back and stated that he need a PA start for Humalog so he can get his med fill. Pt will fax in his new insurance information to be update. Please call [phone] once we summit the PA

## 2014-05-04 NOTE — Telephone Encounter (Signed)
[employer] information has not been received as of yet in order to start PA.

## 2014-08-14 ENCOUNTER — Telehealth: Payer: Self-pay | Admitting: *Deleted

## 2014-08-14 NOTE — Telephone Encounter (Signed)
Patient will call the office to schedule f/u appointment and a1c recheck.

## 2014-09-30 ENCOUNTER — Emergency Department (HOSPITAL_COMMUNITY)
Admission: EM | Admit: 2014-09-30 | Discharge: 2014-09-30 | Disposition: A | Discharge status: Home / Self Care | Payer: BLUE CROSS/BLUE SHIELD | Attending: Emergency Medicine | Admitting: Emergency Medicine

## 2014-09-30 ENCOUNTER — Encounter (HOSPITAL_COMMUNITY): Payer: Self-pay | Admitting: Emergency Medicine

## 2014-09-30 DIAGNOSIS — J020 Streptococcal pharyngitis: Secondary | ICD-10-CM | POA: Insufficient documentation

## 2014-09-30 DIAGNOSIS — E119 Type 2 diabetes mellitus without complications: Secondary | ICD-10-CM | POA: Insufficient documentation

## 2014-09-30 DIAGNOSIS — E785 Hyperlipidemia, unspecified: Secondary | ICD-10-CM | POA: Insufficient documentation

## 2014-09-30 DIAGNOSIS — Z794 Long term (current) use of insulin: Secondary | ICD-10-CM | POA: Insufficient documentation

## 2014-09-30 DIAGNOSIS — Z87891 Personal history of nicotine dependence: Secondary | ICD-10-CM | POA: Insufficient documentation

## 2014-09-30 LAB — RAPID STREP SCREEN (MED CTR MEBANE ONLY): Streptococcus, Group A Screen (Direct): POSITIVE — AB

## 2014-09-30 LAB — CBG MONITORING, ED: GLUCOSE-CAPILLARY: 199 mg/dL — AB (ref 65–99)

## 2014-09-30 MED ORDER — SODIUM CHLORIDE 0.9 % IV BOLUS (SEPSIS)
1000.0000 mL | Freq: Once | INTRAVENOUS | Status: AC
  Administered 2014-09-30: 1000 mL via INTRAVENOUS

## 2014-09-30 MED ORDER — HYDROCODONE-ACETAMINOPHEN 7.5-325 MG/15ML PO SOLN: 10.0000 mL | Freq: Four times a day (QID) | ORAL | Status: DC | PRN

## 2014-09-30 MED ORDER — KETOROLAC TROMETHAMINE 30 MG/ML IJ SOLN
30.0000 mg | Freq: Once | INTRAMUSCULAR | Status: AC
  Administered 2014-09-30: 30 mg via INTRAVENOUS
  Filled 2014-09-30: qty 1

## 2014-09-30 MED ORDER — AMOXICILLIN 500 MG PO CAPS: 500.0000 mg | ORAL_CAPSULE | Freq: Three times a day (TID) | ORAL | Status: DC

## 2014-09-30 NOTE — Discharge Instructions (Signed)
Read the information below.  Use the prescribed medication as directed.  Please discuss all new medications with your pharmacist.  Do not take additional tylenol while taking the prescribed pain medication to avoid overdose.  You may return to the Emergency Department at any time for worsening condition or any new symptoms that concern you.   If you develop high fevers, difficulty swallowing or breathing, or you are unable to tolerate fluids by mouth, return to the ER immediately for a recheck.    ° ° °Strep Throat °Strep throat is an infection of the throat caused by a bacteria named Streptococcus pyogenes. Your health care provider may call the infection streptococcal "tonsillitis" or "pharyngitis" depending on whether there are signs of inflammation in the tonsils or back of the throat. Strep throat is most common in children aged 5-15 years during the cold months of the year, but it can occur in people of any age during any season. This infection is spread from person to person (contagious) through coughing, sneezing, or other close contact. °SIGNS AND SYMPTOMS  °· Fever or chills. °· Painful, swollen, red tonsils or throat. °· Pain or difficulty when swallowing. °· [place] or yellow spots on the tonsils or throat. °· Swollen, tender lymph nodes or "glands" of the neck or under the jaw. °· Red rash all over the body (rare). °DIAGNOSIS  °Many different infections can cause the same symptoms. A test must be done to confirm the diagnosis so the right treatment can be given. A "rapid strep test" can help your health care provider make the diagnosis in a few minutes. If this test is not available, a light swab of the infected area can be used for a throat culture test. If a throat culture test is done, results are usually available in a day or two. °TREATMENT  °Strep throat is treated with antibiotic medicine. °HOME CARE INSTRUCTIONS  °· Gargle with 1 tsp of salt in 1 cup of warm water, 3-4 times per day or as needed  for comfort. °· Family members who also have a sore throat or fever should be tested for strep throat and treated with antibiotics if they have the strep infection. °· Make sure everyone in your household washes their hands well. °· Do not share food, drinking cups, or personal items that could cause the infection to spread to others. °· You may need to eat a soft food diet until your sore throat gets better. °· Drink enough water and fluids to keep your urine clear or pale yellow. This will help prevent dehydration. °· Get plenty of rest. °· Stay home from school, day care, or work until you have been on antibiotics for 24 hours. °· Take medicines only as directed by your health care provider. °· Take your antibiotic medicine as directed by your health care provider. Finish it even if you start to feel better. °SEEK MEDICAL CARE IF:  °· The glands in your neck continue to enlarge. °· You develop a rash, cough, or earache. °· You cough up green, yellow-brown, or bloody sputum. °· You have pain or discomfort not controlled by medicines. °· Your problems seem to be getting worse rather than better. °· You have a fever. °SEEK IMMEDIATE MEDICAL CARE IF:  °· You develop any new symptoms such as vomiting, severe headache, stiff or painful neck, chest pain, shortness of breath, or trouble swallowing. °· You develop severe throat pain, drooling, or changes in your voice. °· You develop swelling of the neck,   or the skin on the neck becomes red and tender. °· You develop signs of dehydration, such as fatigue, dry mouth, and decreased urination. °· You become increasingly sleepy, or you cannot wake up completely. °MAKE SURE YOU: °· Understand these instructions. °· Will watch your condition. °· Will get help right away if you are not doing well or get worse. °Document Released: 02/01/2000 Document Revised: 06/20/2013 Document Reviewed: 04/04/2010 °[employer]® Patient Information ©2015 [employer]. This information is not  intended to replace advice given to you by your health care provider. Make sure you discuss any questions you have with your health care provider. ° °

## 2014-09-30 NOTE — ED Provider Notes (Signed)
CSN: [id number]     Arrival date & time 09/30/14  0944 History   First MD Initiated Contact with Patient 09/30/14 1004     Chief Complaint  Patient presents with  . Sore Throat     (Consider location/radiation/quality/duration/timing/severity/associated sxs/prior Treatment) The history is provided by the patient.     Pt with hx DM p/w sore throat, chills x 3 days.  Pain is constant, worse with swallowing.  Has been drinking fluids but not eating.  Has taken ibuprofen with minimal relief.  Denies known fevers, CP, cough, SOB, nasal congestion.  No hx strep throat.   Past Medical History  Diagnosis Date  . Diabetes mellitus without complication   . Hyperlipidemia    Past Surgical History  Procedure Laterality Date  . Wisdom teeth pulled     Family History  Problem Relation Age of Onset  . Hypertension Maternal Grandmother   . Diabetes Father   . Cancer Neg Hx   . Early death Neg Hx   . Heart disease Neg Hx   . Hyperlipidemia Neg Hx   . Kidney disease Neg Hx   . Learning disabilities Neg Hx   . Stroke Neg Hx    Social History  Substance Use Topics  . Smoking status: Former Smoker    Quit date: 05/28/2013  . Smokeless tobacco: Current User  . Alcohol Use: No    Review of Systems  All other systems reviewed and are negative.     Allergies  Review of patient's allergies indicates no known allergies.  Home Medications   Prior to Admission medications   Medication Sig Start Date End Date Taking? Authorizing Provider  ibuprofen (ADVIL,MOTRIN) 200 MG tablet Take 400 mg by mouth every 6 (six) hours as needed for moderate pain.   Yes Historical Provider, MD  Insulin Lispro Prot & Lispro (HUMALOG MIX 75/25 KWIKPEN) (75-25) 100 UNIT/ML Kwikpen Inject 40 Units into the skin 2 (two) times daily. Inject 60 units in the AM and 40 units in the PM Patient taking differently: Inject 40 Units into the skin 2 (two) times daily.  09/20/13  Yes Janith Lima, MD  ammonium lactate  (LAC-HYDRIN) 12 % cream Apply topically as needed for dry skin. Patient not taking: Reported on 09/30/2014 09/21/13   Janith Lima, MD  Blood Glucose Monitoring Suppl (CONTOUR NEXT EZ MONITOR) W/DEVICE KIT 1 Act by Does not apply route 3 (three) times daily. 05/27/13   Janith Lima, MD  glucose blood (BAYER CONTOUR NEXT TEST) test strip Use as instructed 05/27/13   Janith Lima, MD  Insulin Pen Needle 31G X 5 MM MISC Daily with Insulin 06/06/13   Janith Lima, MD  rosuvastatin (CRESTOR) 10 MG tablet Take 1 tablet (10 mg total) by mouth daily. Patient not taking: Reported on 09/30/2014 06/01/13   Janith Lima, MD   BP 142/86 mmHg  Pulse 110  Temp(Src) 99.2 F (37.3 C) (Oral)  Resp 20  Ht 6' (1.829 m)  Wt 234 lb (106.142 kg)  BMI 31.73 kg/m2  SpO2 96% Physical Exam  Constitutional: He appears well-developed and well-nourished. No distress.  HENT:  Head: Normocephalic and atraumatic.  Mouth/Throat: Uvula is midline. Mucous membranes are not dry. No trismus in the jaw. No uvula swelling. Posterior oropharyngeal edema and posterior oropharyngeal erythema present. No oropharyngeal exudate or tonsillar abscesses.  Eyes: Conjunctivae and EOM are normal. Right eye exhibits no discharge. Left eye exhibits no discharge.  Neck: Normal range of motion.  Neck supple.  Cardiovascular: Normal rate and regular rhythm.   Pulmonary/Chest: Effort normal and breath sounds normal. No stridor. No respiratory distress. He has no wheezes. He has no rales.  Neurological: He is alert.  Skin: He is not diaphoretic.  Psychiatric: He has a normal mood and affect. His behavior is normal.  Nursing note and vitals reviewed.   ED Course  Procedures (including critical care time) Labs Review Labs Reviewed  RAPID STREP SCREEN (NOT AT [facility]) - Abnormal; Notable for the following:    Streptococcus, Group A Screen (Direct) POSITIVE (*)    All other components within normal limits  CBG MONITORING, ED - Abnormal;  Notable for the following:    Glucose-Capillary 199 (*)    All other components within normal limits    Imaging Review No results found. I, Karanvir Balderston, personally reviewed and evaluated these images and lab results as part of my medical decision-making.   EKG Interpretation None      MDM   Final diagnoses:  Strep pharyngitis    Afebrile, nontoxic patient with sore throat.  Clinically no peritonsillar abscess.  No airway concerns.  IVF, toradol given.  CBG 199 Strep screen positive.    D/C home with amoxicillin, hycet, return precautions.  Discussed result, findings, treatment, and follow up  with patient.  Pt given return precautions.  Pt verbalizes understanding and agrees with plan.         Clayton Bibles, PA-C 09/30/14 [place], MD 10/01/14 [phone]

## 2014-09-30 NOTE — ED Notes (Signed)
Pt reports sore throat x3 days. Pt sts he has trouble swallowing. Pt sts that he started to get chills last pm. Denies N/V, SOB. Pt is A&O and in NAD

## 2014-11-06 ENCOUNTER — Telehealth: Payer: Self-pay | Admitting: Internal Medicine

## 2014-11-06 NOTE — Telephone Encounter (Signed)
Pt is needing Insulin Lispro Prot & Lispro (HUMALOG MIX 75/25 KWIKPEN) (75-25) 100 UNIT/ML Joseph [name] [[id number] He has no insurance till October. I scheduled an appt for him on 10/10 He is hoping you may have some samples to get him through till them. If not send enough in for 3 weeks and hopefully he will be able to afford them out of pocket.  Can you please give him a call and let him know what will need to be done.

## 2014-11-07 ENCOUNTER — Telehealth: Payer: Self-pay | Admitting: Internal Medicine

## 2014-11-07 NOTE — Telephone Encounter (Signed)
Pt called in and said that he can not make an appt until the 10/16 .  He said that he needs just a few to make it to his appt.  He can not afford to come in with not ins and he is scared to go off this med?  What options does he have?     Best number [phone]

## 2014-11-07 NOTE — Telephone Encounter (Signed)
Please advise, There is a prior phone note on this. Pt has not been seen in a while

## 2014-11-07 NOTE — Telephone Encounter (Signed)
I have not seen him recently enough to do this

## 2014-11-07 NOTE — Telephone Encounter (Signed)
I don't think we have any samples of the insulin he is looking for

## 2014-11-07 NOTE — Telephone Encounter (Signed)
I did not see samples in fridge? Please advise.

## 2014-11-09 ENCOUNTER — Other Ambulatory Visit: Payer: Self-pay

## 2014-11-09 DIAGNOSIS — E139 Other specified diabetes mellitus without complications: Secondary | ICD-10-CM

## 2014-11-09 MED ORDER — INSULIN LISPRO PROT & LISPRO (75-25 MIX) 100 UNIT/ML KWIKPEN: 40.0000 [IU] | PEN_INJECTOR | Freq: Two times a day (BID) | SUBCUTANEOUS | Status: DC

## 2014-11-09 NOTE — Telephone Encounter (Signed)
1mo supply sent in.  

## 2014-11-10 ENCOUNTER — Telehealth: Payer: Self-pay | Admitting: Internal Medicine

## 2014-11-10 NOTE — Telephone Encounter (Signed)
[facility] on [facility]. The instructions are conflicting with the sig.

## 2014-11-10 NOTE — Telephone Encounter (Signed)
Pharmacy called. [facility] [employer]

## 2014-11-13 NOTE — Telephone Encounter (Signed)
Pt states humalog is too expensive and if there is anything that may be cheaper. His insurance doesn't kick in till next month

## 2014-11-17 NOTE — Telephone Encounter (Signed)
Suggestion to PT when arrived in office for advise. Suggested he make an appt with provider. Pt refused per Steffanie. Pt states he was going to come back in 10 minutes to futher advise,, pt did not return

## 2014-11-27 ENCOUNTER — Ambulatory Visit: Payer: BLUE CROSS/BLUE SHIELD | Admitting: Internal Medicine

## 2014-11-27 DIAGNOSIS — Z0289 Encounter for other administrative examinations: Secondary | ICD-10-CM

## 2014-11-30 ENCOUNTER — Ambulatory Visit: Payer: BLUE CROSS/BLUE SHIELD | Admitting: Internal Medicine

## 2014-11-30 DIAGNOSIS — Z0289 Encounter for other administrative examinations: Secondary | ICD-10-CM

## 2015-03-06 ENCOUNTER — Encounter: Payer: Self-pay | Admitting: Internal Medicine

## 2015-03-06 ENCOUNTER — Telehealth: Payer: Self-pay | Admitting: Internal Medicine

## 2015-03-06 ENCOUNTER — Ambulatory Visit (INDEPENDENT_AMBULATORY_CARE_PROVIDER_SITE_OTHER): Payer: BLUE CROSS/BLUE SHIELD | Admitting: Internal Medicine

## 2015-03-06 ENCOUNTER — Other Ambulatory Visit (INDEPENDENT_AMBULATORY_CARE_PROVIDER_SITE_OTHER): Payer: BLUE CROSS/BLUE SHIELD

## 2015-03-06 VITALS — BP 120/80 | HR 89 | Temp 98.3°F | Resp 16 | Ht 72.0 in | Wt 245.0 lb

## 2015-03-06 DIAGNOSIS — E785 Hyperlipidemia, unspecified: Secondary | ICD-10-CM | POA: Diagnosis not present

## 2015-03-06 DIAGNOSIS — Z23 Encounter for immunization: Secondary | ICD-10-CM

## 2015-03-06 DIAGNOSIS — E139 Other specified diabetes mellitus without complications: Secondary | ICD-10-CM

## 2015-03-06 DIAGNOSIS — M25562 Pain in left knee: Secondary | ICD-10-CM | POA: Diagnosis not present

## 2015-03-06 LAB — HEMOGLOBIN A1C: HEMOGLOBIN A1C: 11.2 % — AB (ref 4.6–6.5)

## 2015-03-06 LAB — LIPID PANEL
Cholesterol: 177 mg/dL (ref 0–200)
HDL: 37.3 mg/dL — AB (ref >39.00)
LDL Cholesterol: 127 mg/dL — ABNORMAL HIGH (ref 0–99)
NONHDL: 139.39
Total CHOL/HDL Ratio: 5
Triglycerides: 64 mg/dL (ref 0.0–149.0)
VLDL: 12.8 mg/dL (ref 0.0–40.0)

## 2015-03-06 LAB — CBC WITH DIFFERENTIAL/PLATELET
BASOS PCT: 0.3 % (ref 0.0–3.0)
Basophils Absolute: 0 10*3/uL (ref 0.0–0.1)
EOS ABS: 0.1 10*3/uL (ref 0.0–0.7)
Eosinophils Relative: 0.7 % (ref 0.0–5.0)
HCT: 47.4 % (ref 39.0–52.0)
HEMOGLOBIN: 15.7 g/dL (ref 13.0–17.0)
LYMPHS ABS: 2.8 10*3/uL (ref 0.7–4.0)
Lymphocytes Relative: 38 % (ref 12.0–46.0)
MCHC: 33.1 g/dL (ref 30.0–36.0)
MCV: 89.7 fl (ref 78.0–100.0)
MONO ABS: 0.6 10*3/uL (ref 0.1–1.0)
Monocytes Relative: 8.2 % (ref 3.0–12.0)
NEUTROS PCT: 52.8 % (ref 43.0–77.0)
Neutro Abs: 3.8 10*3/uL (ref 1.4–7.7)
PLATELETS: 303 10*3/uL (ref 150.0–400.0)
RBC: 5.28 Mil/uL (ref 4.22–5.81)
RDW: 12.5 % (ref 11.5–15.5)
WBC: 7.3 10*3/uL (ref 4.0–10.5)

## 2015-03-06 LAB — URINALYSIS, ROUTINE W REFLEX MICROSCOPIC
BILIRUBIN URINE: NEGATIVE
HGB URINE DIPSTICK: NEGATIVE
KETONES UR: NEGATIVE
LEUKOCYTES UA: NEGATIVE
Nitrite: NEGATIVE
RBC / HPF: NONE SEEN (ref 0–?)
Specific Gravity, Urine: 1.025 (ref 1.000–1.030)
TOTAL PROTEIN, URINE-UPE24: NEGATIVE
URINE GLUCOSE: 100 — AB
UROBILINOGEN UA: 0.2 (ref 0.0–1.0)
WBC, UA: NONE SEEN (ref 0–?)
pH: 5.5 (ref 5.0–8.0)

## 2015-03-06 LAB — COMPREHENSIVE METABOLIC PANEL
ALBUMIN: 4.3 g/dL (ref 3.5–5.2)
ALK PHOS: 72 U/L (ref 39–117)
ALT: 26 U/L (ref 0–53)
AST: 16 U/L (ref 0–37)
BILIRUBIN TOTAL: 0.5 mg/dL (ref 0.2–1.2)
BUN: 14 mg/dL (ref 6–23)
CALCIUM: 9.5 mg/dL (ref 8.4–10.5)
CO2: 29 mEq/L (ref 19–32)
Chloride: 103 mEq/L (ref 96–112)
Creatinine, Ser: 0.85 mg/dL (ref 0.40–1.50)
GFR: 131.19 mL/min (ref >60.00)
Glucose, Bld: 165 mg/dL — ABNORMAL HIGH (ref 70–99)
POTASSIUM: 4.1 meq/L (ref 3.5–5.1)
Sodium: 139 mEq/L (ref 135–145)
TOTAL PROTEIN: 7.8 g/dL (ref 6.0–8.3)

## 2015-03-06 LAB — MICROALBUMIN / CREATININE URINE RATIO
Creatinine,U: 147.3 mg/dL
MICROALB/CREAT RATIO: 1.2 mg/g (ref 0.0–30.0)
Microalb, Ur: 1.7 mg/dL (ref 0.0–1.9)

## 2015-03-06 LAB — TSH: TSH: 1.37 u[IU]/mL (ref 0.35–4.50)

## 2015-03-06 LAB — GLUCOSE, POCT (MANUAL RESULT ENTRY): POC GLUCOSE: 190 mg/dL — AB (ref 70–99)

## 2015-03-06 MED ORDER — INSULIN PEN NEEDLE 31G X 5 MM MISC: Status: DC

## 2015-03-06 MED ORDER — METFORMIN HCL 1000 MG PO TABS: 1000.0000 mg | ORAL_TABLET | Freq: Two times a day (BID) | ORAL | Status: DC

## 2015-03-06 MED ORDER — ONETOUCH VERIO FLEX SYSTEM W/DEVICE KIT: 1.0000 | PACK | Freq: Three times a day (TID) | Status: DC

## 2015-03-06 MED ORDER — GLUCOSE BLOOD VI STRP: ORAL_STRIP | Status: DC

## 2015-03-06 MED ORDER — INSULIN LISPRO PROT & LISPRO (75-25 MIX) 100 UNIT/ML KWIKPEN: 40.0000 [IU] | PEN_INJECTOR | Freq: Two times a day (BID) | SUBCUTANEOUS | Status: DC

## 2015-03-06 NOTE — Telephone Encounter (Signed)
Would like a call back in regards to lab orders.

## 2015-03-06 NOTE — Progress Notes (Signed)
Subjective:  Patient ID: Joseph [name], male    DOB: [birth year]  Age: 35 y.o. MRN: [id number]  CC: Diabetes   HPI Joseph [name] presents for follow-up on diabetes. He tells me that he injured his left knee about 10 days ago when he was playing with his daughter. He complains of mild persistent discomfort. He can still bear weight on the left lower extremity and has good range of motion.  Outpatient Prescriptions Prior to Visit  Medication Sig Dispense Refill  . ammonium lactate (LAC-HYDRIN) 12 % cream Apply topically as needed for dry skin. 385 g 3  . ibuprofen (ADVIL,MOTRIN) 200 MG tablet Take 400 mg by mouth every 6 (six) hours as needed for moderate pain.    [name] Kitchen amoxicillin (AMOXIL) 500 MG capsule Take 1 capsule (500 mg total) by mouth 3 (three) times daily. 30 capsule 0  . Blood Glucose Monitoring Suppl (CONTOUR NEXT EZ MONITOR) W/DEVICE KIT 1 Act by Does not apply route 3 (three) times daily. 2 kit 0  . glucose blood (BAYER CONTOUR NEXT TEST) test strip Use as instructed 100 each 12  . HYDROcodone-acetaminophen (HYCET) 7.5-325 mg/15 ml solution Take 10 mLs by mouth 4 (four) times daily as needed for moderate pain or severe pain. 150 mL 0  . Insulin Lispro Prot & Lispro (HUMALOG MIX 75/25 KWIKPEN) (75-25) 100 UNIT/ML Kwikpen Inject 40 Units into the skin 2 (two) times daily. Inject 60 units in the AM and 40 units in the PM 4 pen 0  . Insulin Pen Needle 31G X 5 MM MISC Daily with Insulin 100 each 11  . rosuvastatin (CRESTOR) 10 MG tablet Take 1 tablet (10 mg total) by mouth daily. 90 tablet 3   No facility-administered medications prior to visit.    ROS Review of Systems  Constitutional: Negative.  Negative for fever, chills, diaphoresis, appetite change and fatigue.  HENT: Negative.   Eyes: Negative.   Respiratory: Negative.  Negative for cough, choking, chest tightness, shortness of breath and stridor.   Cardiovascular: Negative.  Negative for chest pain, palpitations and leg  swelling.  Gastrointestinal: Negative.  Negative for nausea, vomiting, abdominal pain, diarrhea and constipation.  Endocrine: Negative.  Negative for polydipsia, polyphagia and polyuria.  Musculoskeletal: Positive for arthralgias ( left knee). Negative for myalgias, back pain and joint swelling.  Skin: Negative.  Negative for color change and rash.  Hematological: Negative.  Negative for adenopathy. Does not bruise/bleed easily.  Psychiatric/Behavioral: Negative.     Objective:  BP 120/80 mmHg  Pulse 89  Temp(Src) 98.3 F (36.8 C) (Oral)  Resp 16  Ht 6' (1.829 m)  Wt 245 lb (111.131 kg)  BMI 33.22 kg/m2  SpO2 98%  BP Readings from Last 3 Encounters:  03/06/15 120/80  09/30/14 142/86  09/20/13 122/76    Wt Readings from Last 3 Encounters:  03/06/15 245 lb (111.131 kg)  09/30/14 234 lb (106.142 kg)  09/20/13 258 lb (117.028 kg)    Physical Exam  Constitutional: He is oriented to person, place, and time. No distress.  HENT:  Head: Normocephalic and atraumatic.  Mouth/Throat: Oropharynx is clear and moist. No oropharyngeal exudate.  Eyes: Conjunctivae are normal. Right eye exhibits no discharge. Left eye exhibits no discharge. No scleral icterus.  Neck: Normal range of motion. Neck supple. No JVD present. No tracheal deviation present. No thyromegaly present.  Cardiovascular: Normal rate, regular rhythm, normal heart sounds and intact distal pulses.  Exam reveals no gallop and no friction rub.   No murmur  heard. Pulmonary/Chest: Effort normal and breath sounds normal. No stridor. No respiratory distress. He has no wheezes. He has no rales. He exhibits no tenderness.  Abdominal: Soft. Bowel sounds are normal. He exhibits no distension and no mass. There is no tenderness. There is no rebound and no guarding.  Musculoskeletal: Normal range of motion. He exhibits no edema or tenderness.       Left knee: He exhibits normal range of motion, no swelling, no effusion, no ecchymosis,  no deformity, no laceration, no erythema, normal alignment, no LCL laxity, normal patellar mobility and no bony tenderness. No lateral joint line tenderness noted.  Lymphadenopathy:    He has no cervical adenopathy.  Neurological: He is oriented to person, place, and time.  Skin: Skin is warm and dry. No rash noted. He is not diaphoretic. No erythema. No pallor.  Psychiatric: He has a normal mood and affect. His behavior is normal. Judgment and thought content normal.    Lab Results  Component Value Date   WBC 7.3 03/06/2015   HGB 15.7 03/06/2015   HCT 47.4 03/06/2015   PLT 303.0 03/06/2015   GLUCOSE 165* 03/06/2015   CHOL 177 03/06/2015   TRIG 64.0 03/06/2015   HDL 37.30* 03/06/2015   LDLCALC 127* 03/06/2015   ALT 26 03/06/2015   AST 16 03/06/2015   NA 139 03/06/2015   K 4.1 03/06/2015   CL 103 03/06/2015   CREATININE 0.85 03/06/2015   BUN 14 03/06/2015   CO2 29 03/06/2015   TSH 1.37 03/06/2015   HGBA1C 11.2* 03/06/2015   MICROALBUR 1.7 03/06/2015    No results found.  Assessment & Plan:   Joseph [name] was seen today for diabetes.  Diagnoses and all orders for this visit:  Diabetes 1.5, managed as type 1 ([place])- his A1c is up to 11.2%, will restart meds for diabetes with insulin and Glucophage, I've also asked him to see endocrinology. He is due for his annual eye exam. He has been referred to diabetic education as well. -     Blood Glucose Monitoring Suppl (ONETOUCH VERIO FLEX SYSTEM) w/Device KIT; 1 Act by Does not apply route 3 (three) times daily. -     glucose blood test strip; Use TID -     Urinalysis, Routine w reflex microscopic (not at [facility]); Future -     Microalbumin / creatinine urine ratio; Future -     Comprehensive metabolic panel; Future -     CBC with Differential/Platelet; Future -     Hemoglobin A1c; Future -     C-peptide; Future -     POCT Glucose (CBG) -     Amb Referral to Nutrition and Diabetic E -     Ambulatory referral to Ophthalmology -      metFORMIN (GLUCOPHAGE) 1000 MG tablet; Take 1 tablet (1,000 mg total) by mouth 2 (two) times daily with a meal. -     Discontinue: Insulin Lispro Prot & Lispro (HUMALOG MIX 75/25 KWIKPEN) (75-25) 100 UNIT/ML Kwikpen; Inject 40 Units into the skin 2 (two) times daily. Inject 60 units in the AM and 40 units in the PM -     Ambulatory referral to Endocrinology -     rosuvastatin (CRESTOR) 10 MG tablet; Take 1 tablet (10 mg total) by mouth daily.  Hyperlipidemia with target LDL less than 100- he has not achieved his LDL goal, will restart Crestor -     Lipid panel; Future -     TSH; Future -  Comprehensive metabolic panel; Future -     CBC with Differential/Platelet; Future -     rosuvastatin (CRESTOR) 10 MG tablet; Take 1 tablet (10 mg total) by mouth daily.  Left knee pain- I've asked him to see sports medicine about this. -     Ambulatory referral to Sports Medicine  Need for Tdap vaccination -     Tdap vaccine greater than or equal to 7yo IM  Other orders -     Insulin Pen Needle 31G X 5 MM MISC; Daily with Insulin   I have discontinued Mr. Montanari's CONTOUR NEXT EZ MONITOR, glucose blood, amoxicillin, HYDROcodone-acetaminophen, and Insulin Lispro Prot & Lispro. I am also having him start on [place], glucose blood, and metFORMIN. Additionally, I am having him maintain his ammonium lactate, ibuprofen, Insulin Pen Needle, and rosuvastatin.  Meds ordered this encounter  Medications  . Blood Glucose Monitoring Suppl (ONETOUCH VERIO FLEX SYSTEM) w/Device KIT    Sig: 1 Act by Does not apply route 3 (three) times daily.    Dispense:  2 kit    Refill:  0  . glucose blood test strip    Sig: Use TID    Dispense:  100 each    Refill:  12  . metFORMIN (GLUCOPHAGE) 1000 MG tablet    Sig: Take 1 tablet (1,000 mg total) by mouth 2 (two) times daily with a meal.    Dispense:  180 tablet    Refill:  1  . DISCONTD: Insulin Lispro Prot & Lispro (HUMALOG MIX 75/25 KWIKPEN)  (75-25) 100 UNIT/ML Kwikpen    Sig: Inject 40 Units into the skin 2 (two) times daily. Inject 60 units in the AM and 40 units in the PM    Dispense:  4 pen    Refill:  3  . Insulin Pen Needle 31G X 5 MM MISC    Sig: Daily with Insulin    Dispense:  100 each    Refill:  3  . rosuvastatin (CRESTOR) 10 MG tablet    Sig: Take 1 tablet (10 mg total) by mouth daily.    Dispense:  90 tablet    Refill:  3     Follow-up: Return in about 4 months (around 07/04/2015).  Scarlette Calico, MD

## 2015-03-06 NOTE — Progress Notes (Signed)
Pre visit review using our clinic review tool, if applicable. No additional management support is needed unless otherwise documented below in the visit note. 

## 2015-03-06 NOTE — Patient Instructions (Signed)

## 2015-03-07 LAB — C-PEPTIDE: C PEPTIDE: 1.19 ng/mL (ref 0.80–3.90)

## 2015-03-07 NOTE — Telephone Encounter (Signed)
Pt informed. States they may be a problem with Humalog being covered by ins will be getting intouch with pharmacy. I have not received a paper stating a PA is needed for this as of today

## 2015-03-09 ENCOUNTER — Other Ambulatory Visit: Payer: Self-pay | Admitting: *Deleted

## 2015-03-09 MED ORDER — ROSUVASTATIN CALCIUM 10 MG PO TABS: 10.0000 mg | ORAL_TABLET | Freq: Every day | ORAL | Status: DC

## 2015-03-09 MED ORDER — INSULIN ASPART PROT & ASPART (70-30 MIX) 100 UNIT/ML PEN: PEN_INJECTOR | SUBCUTANEOUS | Status: DC

## 2015-03-09 NOTE — Telephone Encounter (Signed)
Its the Novolog 70/30...Raechel Chute

## 2015-03-09 NOTE — Telephone Encounter (Signed)
Received call pt states md rx Humalog, but his insurance will not cover the alternative would be Novolog. Needing rx sent to pharmacy. MD out pls advise...Raechel Chute

## 2015-03-09 NOTE — Telephone Encounter (Signed)
Please call and confirm patient sig as last humalog script has 2 different scripts on it: 40 BID and 60 units qam 40 units qpm. Rx for novolog 70-/30 flexpen, the dosing is equivalent to the humalog. Can send in the correct sig for it I have pended.

## 2015-03-09 NOTE — Telephone Encounter (Signed)
It would appear that the humalog is a 75/25 mix. Is the alternative novolog or some kind of 70/30 mixture? They are very different.

## 2015-03-09 NOTE — Telephone Encounter (Signed)
Called pt to verify units. After md receive labs he increase to 60 am & 40 pm. Inform will [facility] will be the same units...Raechel Chute

## 2015-03-12 ENCOUNTER — Other Ambulatory Visit: Payer: Self-pay

## 2015-03-12 DIAGNOSIS — E785 Hyperlipidemia, unspecified: Secondary | ICD-10-CM

## 2015-03-12 DIAGNOSIS — E139 Other specified diabetes mellitus without complications: Secondary | ICD-10-CM

## 2015-03-12 MED ORDER — ROSUVASTATIN CALCIUM 10 MG PO TABS: 10.0000 mg | ORAL_TABLET | Freq: Every day | ORAL | Status: DC

## 2015-03-13 ENCOUNTER — Encounter: Payer: Self-pay | Admitting: Family Medicine

## 2015-03-13 ENCOUNTER — Other Ambulatory Visit (INDEPENDENT_AMBULATORY_CARE_PROVIDER_SITE_OTHER): Payer: BLUE CROSS/BLUE SHIELD

## 2015-03-13 ENCOUNTER — Ambulatory Visit (INDEPENDENT_AMBULATORY_CARE_PROVIDER_SITE_OTHER): Payer: BLUE CROSS/BLUE SHIELD | Admitting: Family Medicine

## 2015-03-13 ENCOUNTER — Ambulatory Visit (INDEPENDENT_AMBULATORY_CARE_PROVIDER_SITE_OTHER)
Admission: RE | Admit: 2015-03-13 | Discharge: 2015-03-13 | Disposition: A | Discharge status: Home / Self Care | Payer: BLUE CROSS/BLUE SHIELD | Source: Ambulatory Visit | Attending: Family Medicine | Admitting: Family Medicine

## 2015-03-13 VITALS — BP 132/80 | HR 87 | Wt 250.0 lb

## 2015-03-13 DIAGNOSIS — M25562 Pain in left knee: Secondary | ICD-10-CM

## 2015-03-13 DIAGNOSIS — S83282A Other tear of lateral meniscus, current injury, left knee, initial encounter: Secondary | ICD-10-CM | POA: Diagnosis not present

## 2015-03-13 DIAGNOSIS — S83289A Other tear of lateral meniscus, current injury, unspecified knee, initial encounter: Secondary | ICD-10-CM | POA: Insufficient documentation

## 2015-03-13 MED ORDER — MELOXICAM 15 MG PO TABS: 15.0000 mg | ORAL_TABLET | Freq: Every day | ORAL | Status: DC

## 2015-03-13 NOTE — Assessment & Plan Note (Signed)
Patient is a lateral meniscal tear. I do believe the patient is having some mild limitation in range of motion. We discussed that if any locking occurs but then further imaging may be warranted. X-rays ordered today for further evaluation and make sure there is no other bony normality that be contributing. Discussed with patient that if any swelling occurs, any pain in the calf occurs that he would need to seek medical attention immediately with patient being a diabetic as well as hyperlipidemia he could be at increased risk of a clot but patient declined a Doppler ultrasound today. Patient does not feel that wasn't and feels better after the injection which likely is the cause. Patient will try home exercises and given a prescription for an anti-inflammatory. We discussed icing protocol. If any worsening symptoms he will come back sooner otherwise. I can follow-up again in 3 weeks.

## 2015-03-13 NOTE — Progress Notes (Signed)
[facility] [address] [place], [place] [address] Phone: [phone] Subjective:    I'm seeing this patient by the request  of:  Sanda Linger, MD   CC: Left knee pain  [id number] Joseph [name] is a 35 y.o. male coming in with complaint of left knee pain. Patient states that this is been going on several weeks. Does not remember any true injury. Patient states that the pain is on the medial as well as lateral aspect of the knee. Hurts with any type of walking and does hurt when he does some flexing of the knee itself. Denies any swelling. Some mild radiation into the calf. No swelling. No shortness of breath. No erythema. No recent fevers chills or any abnormal weight loss. Patient states though that getting in and out of the car can be very difficult. Patient states that the rotational component seems to be worse. Flexing the knee can give her significant pain but denies any locking or giving out on him. Has not tried any home modalities at this point except for ibuprofen     Past Medical History  Diagnosis Date  . Diabetes mellitus without complication (HCC)   . Hyperlipidemia    Past Surgical History  Procedure Laterality Date  . Wisdom teeth pulled     Social History   Social History  . Marital Status: Single    Spouse Name: N/A  . Number of Children: N/A  . Years of Education: N/A   Social History Main Topics  . Smoking status: Former Smoker    Quit date: 05/28/2013  . Smokeless tobacco: Current User  . Alcohol Use: No  . Drug Use: No  . Sexual Activity: Not Currently   Other Topics Concern  . Not on file   Social History Narrative   No Known Allergies Family History  Problem Relation Age of Onset  . Hypertension Maternal Grandmother   . Diabetes Father   . Cancer Neg Hx   . Early death Neg Hx   . Heart disease Neg Hx   . Hyperlipidemia Neg Hx   . Kidney disease Neg Hx   . Learning disabilities Neg Hx   . Stroke Neg Hx       Past medical history, social, surgical and family history all reviewed in electronic medical record.  No pertanent information unless stated regarding to the chief complaint.   Review of Systems: No headache, visual changes, nausea, vomiting, diarrhea, constipation, dizziness, abdominal pain, skin rash, fevers, chills, night sweats, weight loss, swollen lymph nodes, body aches, joint swelling, muscle aches, chest pain, shortness of breath, mood changes.   Objective Blood pressure 132/80, pulse 87, weight 250 lb (113.399 kg), SpO2 97 %.  General: No apparent distress alert and oriented x3 mood and affect normal, dressed appropriately.  HEENT: Pupils equal, extraocular movements intact  Respiratory: Patient's speak in full sentences and does not appear short of breath  Cardiovascular: No lower extremity edema, non tender, no erythema  Skin: Warm dry intact with no signs of infection or rash on extremities or on axial skeleton.  Abdomen: Soft nontender  Neuro: Cranial nerves II through XII are intact, neurovascularly intact in all extremities with 2+ DTRs and 2+ pulses.  Lymph: No lymphadenopathy of posterior or anterior cervical chain or axillae bilaterally.  Gait normal with good balance and coordination.  MSK:  Non tender with full range of motion and good stability and symmetric strength and tone of shoulders, elbows, wrist, hip, and  ankles bilaterally.  Knee: Left Normal to inspection with no erythema or effusion or obvious bony abnormalities. Tender over the medial and lateral joint line ROM full in flexion and extension and lower leg rotation. Ligaments with solid consistent endpoints including ACL, PCL, LCL, MCL. Positive Mcmurray's, Apley's, and Thessalonian tests. Non painful patellar compression. Patellar glide with mild crepitus. Patellar and quadriceps tendons unremarkable. Hamstring and quadriceps strength is normal.    MSK US performed of: Left knee This study was  ordered, performed, and interpreted by Terrilee Files D.O.  Knee: All structures visualized. Trace effusion of the suprapatellar pouch  Mild degenerative changes of the medial meniscus. Patient though does have a large acute tear of the anterior lateral meniscus. No significant displacement. Patellar Tendon unremarkable on long and transverse views without effusion. No abnormality of prepatellar bursa. LCL and MCL unremarkable on long and transverse views. No abnormality of origin of medial or lateral head of the gastrocnemius.  IMPRESSION: Lateral meniscal tear  After informed written and verbal consent, patient was seated on exam table. Left knee was prepped with alcohol swab and utilizing anterolateral approach, patient's left knee space was injected with 4:1  marcaine 0.5%: Kenalog /dL. Patient tolerated the procedure well without immediate complications.  Procedure note 97110; 15 minutes spent for Therapeutic exercises as stated in above notes.  This included exercises focusing on stretching, strengthening, with significant focus on eccentric aspects.  [place] extension exercises working on vastus medialis oblique strengthening as well as hip abductor strengthening and stabilization. Proper technique shown and discussed handout in great detail with ATC.  All questions were discussed and answered.      Impression and Recommendations:     This case required medical decision making of moderate complexity.      Note: This dictation was prepared with Dragon dictation along with smaller phrase technology. Any transcriptional errors that result from this process are unintentional.

## 2015-03-13 NOTE — Patient Instructions (Signed)
Good to see you Ice is your friend Ice 20 minutes 2 times daily. Usually after activity and before bed. Exercises 3 times a week.  Gave injection in your knee today  Meloxicam daily for 10 days then as needed See me agai nin 3 weeks and we will make sure you are doing better.

## 2015-03-23 ENCOUNTER — Ambulatory Visit: Payer: BLUE CROSS/BLUE SHIELD | Admitting: Endocrinology

## 2015-03-28 ENCOUNTER — Ambulatory Visit: Payer: BLUE CROSS/BLUE SHIELD | Admitting: *Deleted

## 2015-04-03 ENCOUNTER — Ambulatory Visit: Payer: BLUE CROSS/BLUE SHIELD | Admitting: Family Medicine

## 2015-04-05 ENCOUNTER — Ambulatory Visit (INDEPENDENT_AMBULATORY_CARE_PROVIDER_SITE_OTHER): Payer: BLUE CROSS/BLUE SHIELD | Admitting: Family Medicine

## 2015-04-05 ENCOUNTER — Encounter: Payer: Self-pay | Admitting: Family Medicine

## 2015-04-05 VITALS — BP 132/94 | HR 96 | Ht 72.0 in | Wt 260.0 lb

## 2015-04-05 DIAGNOSIS — S83282A Other tear of lateral meniscus, current injury, left knee, initial encounter: Secondary | ICD-10-CM

## 2015-04-05 NOTE — Assessment & Plan Note (Signed)
Patient does have a lateral meniscal tear but did respond well to the injection. At this point patient will continue to do conservative therapy. Patient will be sent to formal physical therapy and this was done today. We discussed icing regimen. We discussed him any worsening symptoms we may need to consider advanced imaging. Patient will try to do the icing and topical anti-inflammatories unremarkable regular basis. Patient will come back and see me again in 3-4 weeks.  Spent  25 minutes with patient face-to-face and had greater than 50% of counseling including as described above in assessment and plan.

## 2015-04-05 NOTE — Patient Instructions (Signed)
Good to see yo u You are doing great  Physical therapy will be calling you Ice still can help OK to be running on a treadmill now but not hard surfaces Avoid jumping and twisting motions See me again in 4 weeks.

## 2015-04-05 NOTE — Progress Notes (Signed)
Pre visit review using our clinic review tool, if applicable. No additional management support is needed unless otherwise documented below in the visit note. 

## 2015-04-05 NOTE — Progress Notes (Signed)
[facility] [address] [place], [place] [address] Phone: [phone] Subjective:    I'm seeing this patient by the request  of:  Sanda Linger, MD   CC: Left knee pain follow-up  [id number] Joseph [name] is a 35 y.o. male coming in with complaint of left knee pain. Patient was seen previously and was diagnosed with more of a lateral meniscal tear. Patient was given an injection has been doing the exercises occasionally. Patient states that he is approximate 65-70% better. Still has some mild discomfort. Patient has forgotten about the vitamins as well as not doing the icing regularly. No nighttime pain.     Past Medical History  Diagnosis Date  . Diabetes mellitus without complication (HCC)   . Hyperlipidemia    Past Surgical History  Procedure Laterality Date  . Wisdom teeth pulled     Social History   Social History  . Marital Status: Single    Spouse Name: N/A  . Number of Children: N/A  . Years of Education: N/A   Social History Main Topics  . Smoking status: Former Smoker    Quit date: 05/28/2013  . Smokeless tobacco: Current User  . Alcohol Use: No  . Drug Use: No  . Sexual Activity: Not Currently   Other Topics Concern  . None   Social History Narrative   No Known Allergies Family History  Problem Relation Age of Onset  . Hypertension Maternal Grandmother   . Diabetes Father   . Cancer Neg Hx   . Early death Neg Hx   . Heart disease Neg Hx   . Hyperlipidemia Neg Hx   . Kidney disease Neg Hx   . Learning disabilities Neg Hx   . Stroke Neg Hx     Past medical history, social, surgical and family history all reviewed in electronic medical record.  No pertanent information unless stated regarding to the chief complaint.   Review of Systems: No headache, visual changes, nausea, vomiting, diarrhea, constipation, dizziness, abdominal pain, skin rash, fevers, chills, night sweats, weight loss, swollen lymph nodes, body  aches, joint swelling, muscle aches, chest pain, shortness of breath, mood changes.   Objective Blood pressure 132/94, pulse 96, height 6' (1.829 m), weight 260 lb (117.935 kg), SpO2 97 %.  General: No apparent distress alert and oriented x3 mood and affect normal, dressed appropriately.  HEENT: Pupils equal, extraocular movements intact  Respiratory: Patient's speak in full sentences and does not appear short of breath  Cardiovascular: No lower extremity edema, non tender, no erythema  Skin: Warm dry intact with no signs of infection or rash on extremities or on axial skeleton.  Abdomen: Soft nontender  Neuro: Cranial nerves II through XII are intact, neurovascularly intact in all extremities with 2+ DTRs and 2+ pulses.  Lymph: No lymphadenopathy of posterior or anterior cervical chain or axillae bilaterally.  Gait normal with good balance and coordination.  MSK:  Non tender with full range of motion and good stability and symmetric strength and tone of shoulders, elbows, wrist, hip, and ankles bilaterally.  Knee: Left Normal to inspection with no erythema or effusion or obvious bony abnormalities. Tender over the medial and lateral joint line ROM full in flexion and extension and lower leg rotation. Ligaments with solid consistent endpoints including ACL, PCL, LCL, MCL. Positive Mcmurray's, Apley's, and Thessalonian tests. This is still present illness Dennie Bible as well as a previous exam Non painful patellar compression. Patellar glide with mild crepitus. Patellar and  quadriceps tendons unremarkable. Hamstring and quadriceps strength is normal.  Contralateral knee unremarkable  MSK US performed of: Left knee This study was ordered, performed, and interpreted by Terrilee Files D.O.  Knee: All structures visualized. No effusion noted Patient does have a very small lateral meniscal tear still remaining. But no significant hypoechoic changes. Patellar Tendon unremarkable on long and transverse  views without effusion. No abnormality of prepatellar bursa. LCL and MCL unremarkable on long and transverse views. No abnormality of origin of medial or lateral head of the gastrocnemius.  IMPRESSION: Lateral meniscal tear       Impression and Recommendations:     This case required medical decision making of moderate complexity.      Note: This dictation was prepared with Dragon dictation along with smaller phrase technology. Any transcriptional errors that result from this process are unintentional.

## 2015-04-11 ENCOUNTER — Ambulatory Visit (INDEPENDENT_AMBULATORY_CARE_PROVIDER_SITE_OTHER): Payer: BLUE CROSS/BLUE SHIELD | Admitting: Endocrinology

## 2015-04-11 VITALS — BP 130/96 | HR 104 | Temp 98.2°F | Ht 72.0 in | Wt 256.0 lb

## 2015-04-11 DIAGNOSIS — E139 Other specified diabetes mellitus without complications: Secondary | ICD-10-CM

## 2015-04-11 MED ORDER — INSULIN ASPART PROT & ASPART (70-30 MIX) 100 UNIT/ML PEN: 50.0000 [IU] | PEN_INJECTOR | Freq: Two times a day (BID) | SUBCUTANEOUS | Status: DC

## 2015-04-11 NOTE — Patient Instructions (Addendum)
good diet and exercise significantly improve the control of your diabetes.  please let me know if you wish to be referred to a dietician.  high blood sugar is very risky to your health.  you should see an eye doctor and dentist every year.  It is very important to get all recommended vaccinations.  controlling your blood pressure and cholesterol drastically reduces the damage diabetes does to your body.  Those who smoke should quit.  please discuss these with your doctor.  check your blood sugar twice a day.  vary the time of day when you check, between before the 3 meals, and at bedtime.  also check if you have symptoms of your blood sugar being too high or too low.  please keep a record of the readings and bring it to your next appointment here (or you can bring the meter itself).  You can write it on any piece of paper.  please call us sooner if your blood sugar goes below 70, or if you have a lot of readings over 200. For now, please change the insulin to 50 units twice a day (with meals). Please come back for a follow-up appointment in 6 weeks.

## 2015-04-11 NOTE — Progress Notes (Signed)
Subjective:    Patient ID: Joseph [name], male    DOB: 1981/02/17, 35 y.o.   MRN: [id number]  HPI pt states DM was dx'ed in 2014; he has mild if any neuropathy of the lower extremities; he is unaware of any associated chronic complications; he has been on insulin since soon after dx; pt says his diet and exercise are good; he has never had pancreatitis, severe hypoglycemia or DKA.  He works 3rd shift.  He says his recent a1c was affected by his being off insulin, due to insurance issues.  He now takes as rx'ed, and cbg's are now well-controlled.  He says cbg's are seldom low (60's).  This happens after a smaller than anticipated meal.   Past Medical History  Diagnosis Date  . Diabetes mellitus without complication ([place])   . Hyperlipidemia     Past Surgical History  Procedure Laterality Date  . Wisdom teeth pulled      Social History   Social History  . Marital Status: Single    Spouse Name: N/A  . Number of Children: N/A  . Years of Education: N/A   Occupational History  . Not on file.   Social History Main Topics  . Smoking status: Former Smoker    Quit date: 05/28/2013  . Smokeless tobacco: Current User  . Alcohol Use: No  . Drug Use: No  . Sexual Activity: Not Currently   Other Topics Concern  . Not on file   Social History Narrative    Current Outpatient Prescriptions on File Prior to Visit  Medication Sig Dispense Refill  . Blood Glucose Monitoring Suppl (ONETOUCH VERIO FLEX SYSTEM) w/Device KIT 1 Act by Does not apply route 3 (three) times daily. 2 kit 0  . glucose blood test strip Use TID 100 each 12  . ibuprofen (ADVIL,MOTRIN) 200 MG tablet Take 400 mg by mouth every 6 (six) hours as needed for moderate pain.    . Insulin Pen Needle 31G X 5 MM MISC Daily with Insulin 100 each 3  . meloxicam (MOBIC) 15 MG tablet Take 1 tablet (15 mg total) by mouth daily. 30 tablet 0  . metFORMIN (GLUCOPHAGE) 1000 MG tablet Take 1 tablet (1,000 mg total) by mouth 2 (two)  times daily with a meal. 180 tablet 1  . rosuvastatin (CRESTOR) 10 MG tablet Take 1 tablet (10 mg total) by mouth daily. 90 tablet 3  . ammonium lactate (LAC-HYDRIN) 12 % cream Apply topically as needed for dry skin. (Patient not taking: Reported on 04/11/2015) 385 g 3   No current facility-administered medications on file prior to visit.   No Known Allergies  Family History  Problem Relation Age of Onset  . Hypertension Maternal Grandmother   . Diabetes Father   . Cancer Neg Hx   . Early death Neg Hx   . Heart disease Neg Hx   . Hyperlipidemia Neg Hx   . Kidney disease Neg Hx   . Learning disabilities Neg Hx   . Stroke Neg Hx    BP 130/96 mmHg  Pulse 104  Temp(Src) 98.2 F (36.8 C) (Oral)  Ht 6' (1.829 m)  Wt 256 lb (116.121 kg)  BMI 34.71 kg/m2  SpO2 96%  Review of Systems denies blurry vision, headache, chest pain, sob, n/v, urinary frequency, muscle cramps, excessive diaphoresis, depression, cold intolerance, rhinorrhea, and easy bruising.  He has gained weight.      Objective:   Physical Exam VS: see vs page GEN: no distress HEAD:  head: no deformity eyes: no periorbital swelling, no proptosis external nose and ears are normal mouth: no lesion seen NECK: supple, thyroid is not enlarged CHEST WALL: no deformity LUNGS: clear to auscultation BREASTS:  No gynecomastia.   CV: reg rate and rhythm, no murmur.   ABD: abdomen is soft, nontender.  no hepatosplenomegaly.  not distended.  no hernia. MUSCULOSKELETAL: muscle bulk and strength are grossly normal.  no obvious joint swelling.  gait is normal and steady EXTEMITIES: no deformity.  no ulcer on the feet.  feet are of normal color and temp.  no edema. PULSES: dorsalis pedis intact bilat.  no carotid bruit.   NEURO:  cn 2-12 grossly intact.   readily moves all 4's.  sensation is intact to touch on the feet SKIN:  Normal texture and temperature.  No rash or suspicious lesion is visible.   NODES:  None palpable at the  neck PSYCH: alert, well-oriented.  Does not appear anxious nor depressed.    Lab Results  Component Value Date   HGBA1C 11.2* 03/06/2015   I have reviewed outside records, and summarized: Pt was noted to have severely elevated a1c, and referred here.  Lab Results  Component Value Date   CHOL 177 03/06/2015   HDL 37.30* 03/06/2015   LDLCALC 127* 03/06/2015   TRIG 64.0 03/06/2015   CHOLHDL 5 03/06/2015      Assessment & Plan:  DM: severe exacerbation: He declines fructosamine, as he says glycemic control is better.   HTN: on no rx.  We'll recheck next time.   Dyslipidemia: we'll recheck lipids with next labs.    Patient is advised the following: Patient Instructions  good diet and exercise significantly improve the control of your diabetes.  please let me know if you wish to be referred to a dietician.  high blood sugar is very risky to your health.  you should see an eye doctor and dentist every year.  It is very important to get all recommended vaccinations.  controlling your blood pressure and cholesterol drastically reduces the damage diabetes does to your body.  Those who smoke should quit.  please discuss these with your doctor.  check your blood sugar twice a day.  vary the time of day when you check, between before the 3 meals, and at bedtime.  also check if you have symptoms of your blood sugar being too high or too low.  please keep a record of the readings and bring it to your next appointment here (or you can bring the meter itself).  You can write it on any piece of paper.  please call us sooner if your blood sugar goes below 70, or if you have a lot of readings over 200. For now, please change the insulin to 50 units twice a day (with meals). Please come back for a follow-up appointment in 6 weeks.

## 2015-04-13 ENCOUNTER — Encounter: Payer: Self-pay | Admitting: Family Medicine

## 2015-04-13 ENCOUNTER — Ambulatory Visit (INDEPENDENT_AMBULATORY_CARE_PROVIDER_SITE_OTHER): Payer: BLUE CROSS/BLUE SHIELD | Admitting: Family Medicine

## 2015-04-13 VITALS — BP 124/80 | HR 90 | Temp 98.5°F | Ht 72.0 in | Wt 257.4 lb

## 2015-04-13 DIAGNOSIS — J069 Acute upper respiratory infection, unspecified: Secondary | ICD-10-CM

## 2015-04-13 MED ORDER — BENZONATATE 100 MG PO CAPS
100.0000 mg | ORAL_CAPSULE | Freq: Three times a day (TID) | ORAL | Status: DC | PRN
Start: 2015-04-13 — End: 2015-12-20

## 2015-04-13 NOTE — Patient Instructions (Signed)

## 2015-04-13 NOTE — Progress Notes (Signed)
HPI:  Joseph [name] is 35 yo here for an acute visit for:  "A Cold" -started: yesterday -symptoms:nasal congestion, sore throat, PND, cough -denies:fever, SOB, NVD, tooth pain -has tried: dayquil which was helpful -sick contacts/travel/risks: none  ROS: See pertinent positives and negatives per HPI.  Past Medical History  Diagnosis Date  . Diabetes mellitus without complication ([facility])   . Hyperlipidemia     Past Surgical History  Procedure Laterality Date  . Wisdom teeth pulled      Family History  Problem Relation Age of Onset  . Hypertension Maternal Grandmother   . Diabetes Father   . Cancer Neg Hx   . Early death Neg Hx   . Heart disease Neg Hx   . Hyperlipidemia Neg Hx   . Kidney disease Neg Hx   . Learning disabilities Neg Hx   . Stroke Neg Hx     Social History   Social History  . Marital Status: Single    Spouse Name: N/A  . Number of Children: N/A  . Years of Education: N/A   Social History Main Topics  . Smoking status: Former Smoker    Quit date: 05/28/2013  . Smokeless tobacco: Current User  . Alcohol Use: No  . Drug Use: No  . Sexual Activity: Not Currently   Other Topics Concern  . None   Social History Narrative     Current outpatient prescriptions:  .  ammonium lactate (LAC-HYDRIN) 12 % cream, Apply topically as needed for dry skin., Disp: 385 g, Rfl: 3 .  Blood Glucose Monitoring Suppl (ONETOUCH VERIO FLEX SYSTEM) w/Device KIT, 1 Act by Does not apply route 3 (three) times daily., Disp: 2 kit, Rfl: 0 .  glucose blood test strip, Use TID, Disp: 100 each, Rfl: 12 .  ibuprofen (ADVIL,MOTRIN) 200 MG tablet, Take 400 mg by mouth every 6 (six) hours as needed for moderate pain., Disp: , Rfl:  .  insulin aspart protamine - aspart (NOVOLOG 70/30 MIX) (70-30) 100 UNIT/ML FlexPen, Inject 0.5 mLs (50 Units total) into the skin 2 (two) times daily with a meal. And pen needles 2/day, Disp: 45 mL, Rfl: 11 .  Insulin Pen Needle 31G X 5 MM MISC,  Daily with Insulin, Disp: 100 each, Rfl: 3 .  meloxicam (MOBIC) 15 MG tablet, Take 1 tablet (15 mg total) by mouth daily., Disp: 30 tablet, Rfl: 0 .  metFORMIN (GLUCOPHAGE) 1000 MG tablet, Take 1 tablet (1,000 mg total) by mouth 2 (two) times daily with a meal., Disp: 180 tablet, Rfl: 1 .  rosuvastatin (CRESTOR) 10 MG tablet, Take 1 tablet (10 mg total) by mouth daily., Disp: 90 tablet, Rfl: 3 .  benzonatate (TESSALON PERLES) 100 MG capsule, Take 1 capsule (100 mg total) by mouth 3 (three) times daily as needed for cough., Disp: 20 capsule, Rfl: 0  EXAM:  Filed Vitals:   04/13/15 1128  BP: 124/80  Pulse: 90  Temp: 98.5 F (36.9 C)    Body mass index is 34.9 kg/(m^2).  GENERAL: vitals reviewed and listed above, alert, oriented, appears well hydrated and in no acute distress  HEENT: atraumatic, conjunttiva clear, no obvious abnormalities on inspection of external nose and ears, normal appearance of ear canals and TMs, clear nasal congestion, mild post oropharyngeal erythema with PND, no tonsillar edema or exudate, no sinus TTP  NECK: no obvious masses on inspection  LUNGS: clear to auscultation bilaterally, no wheezes, rales or rhonchi, good air movement  CV: HRRR, no peripheral edema  MS: moves all extremities without noticeable abnormality  PSYCH: pleasant and cooperative, no obvious depression or anxiety  ASSESSMENT AND PLAN:  Discussed the following assessment and plan:  Acute upper respiratory infection  -given HPI and exam findings today, a serious infection or illness is unlikely. We discussed potential etiologies, with VURI being most likely, and advised supportive care and monitoring. We discussed treatment side effects, likely course, antibiotic misuse, transmission, and signs of developing a serious illness. -of course, we advised to return or notify a doctor immediately if symptoms worsen or persist or new concerns arise.    Patient Instructions  INSTRUCTIONS FOR  UPPER RESPIRATORY INFECTION:  -plenty of rest and fluids  -nasal saline wash 2-3 times daily (use prepackaged nasal saline or bottled/distilled water if making your own)   -can use AFRIN nasal spray for drainage and nasal congestion - but do NOT use longer then 3-4 days  -can use tylenol (in no history of liver disease) or ibuprofen (if no history of kidney disease, bowel bleeding or significant heart disease) as directed for aches and sorethroat  -in the winter time, using a humidifier at night is helpful (please follow cleaning instructions)  -if you are taking a cough medication - use only as directed, may also try a teaspoon of honey to coat the throat and throat lozenges. If given a cough medication with codeine or hydrocodone or other narcotic please be advised that this contains a strong and  potentially addicting medication. Please follow instructions carefully, take as little as possible and only use AS NEEDED for severe cough. Discuss potential side effects with your pharmacy. Please do not drive or operate machinery while taking these types of medications. Please do not take other sedating medications, drugs or alcohol while taking this medication without discussing with your doctor.  -for sore throat, salt water gargles can help  -follow up if you have fevers, facial pain, tooth pain, difficulty breathing or are worsening or symptoms persist longer then expected  Upper Respiratory Infection, Adult An upper respiratory infection (URI) is also known as the common cold. It is often caused by a type of germ (virus). Colds are easily spread (contagious). You can pass it to others by kissing, coughing, sneezing, or drinking out of the same glass. Usually, you get better in 1 to 3  weeks.  However, the cough can last for even longer. HOME CARE   Only take medicine as told by your doctor. Follow instructions provided above.  Drink enough water and fluids to keep your pee (urine) clear or  pale yellow.  Get plenty of rest.  Return to work when your temperature is < 100 for 24 hours or as told by your doctor. You may use a face mask and wash your hands to stop your cold from spreading. GET HELP RIGHT AWAY IF:   After the first few days, you feel you are getting worse.  You have questions about your medicine.  You have chills, shortness of breath, or red spit (mucus).  You have pain in the face for more then 1-2 days, especially when you bend forward.  You have a fever, puffy (swollen) neck, pain when you swallow, or Ptacek spots in the back of your throat.  You have a bad headache, ear pain, sinus pain, or chest pain.  You have a high-pitched whistling sound when you breathe in and out (wheezing).  You cough up blood.  You have sore muscles or a stiff neck. MAKE SURE YOU:  Understand these instructions.  Will watch your condition.  Will get help right away if you are not doing well or get worse. Document Released: 07/23/2007 Document Revised: 04/28/2011 Document Reviewed: 05/11/2013 [facility] Patient Information 2015 [place], [place]. This information is not intended to replace advice given to you by your health care provider. Make sure you discuss any questions you have with your health care provider.      Colin Benton R.

## 2015-04-13 NOTE — Progress Notes (Signed)
Pre visit review using our clinic review tool, if applicable. No additional management support is needed unless otherwise documented below in the visit note. 

## 2015-04-20 ENCOUNTER — Ambulatory Visit: Payer: BLUE CROSS/BLUE SHIELD | Admitting: Rehabilitative and Restorative Service Providers"

## 2015-04-25 ENCOUNTER — Ambulatory Visit (INDEPENDENT_AMBULATORY_CARE_PROVIDER_SITE_OTHER): Payer: BLUE CROSS/BLUE SHIELD | Admitting: Rehabilitative and Restorative Service Providers"

## 2015-04-25 ENCOUNTER — Ambulatory Visit: Payer: BLUE CROSS/BLUE SHIELD | Admitting: *Deleted

## 2015-04-25 ENCOUNTER — Encounter: Payer: Self-pay | Admitting: Rehabilitative and Restorative Service Providers"

## 2015-04-25 DIAGNOSIS — Z7409 Other reduced mobility: Secondary | ICD-10-CM

## 2015-04-25 DIAGNOSIS — R29898 Other symptoms and signs involving the musculoskeletal system: Secondary | ICD-10-CM

## 2015-04-25 DIAGNOSIS — M25562 Pain in left knee: Secondary | ICD-10-CM

## 2015-04-25 NOTE — Patient Instructions (Signed)
Quads / HF, Prone    Lie face down, knees together. Grasp one ankle with same-side hand. Use towel if needed to reach. Gently pull foot toward buttock. Hold __30_ seconds. Repeat _3__ times per session. Do _2-3__ sessions per day.  HIP: Hamstrings - Supine    Place strap around foot. Raise leg up, keep knee straight. Hold _30__ seconds. __3_ reps per set, _2-3__ sets per day    Outer Hip Stretch: Reclined IT Band Stretch (Strap)    Strap around opposite foot, pull across only as far as possible with shoulders on mat. Hold for __30 sec . Repeat __3__ times each leg. 2-3  Times a day   Gastroc Stretch    Stand with right foot back, leg straight, forward leg bent. Keeping heel on floor, turned slightly out, lean into wall until stretch is felt in calf. Hold __30__ seconds. Repeat __3__ times per set. Do __2-3__ times per day.   Calf Stretch ([place]us)    Gently bend knees slightly and move one foot back. Lean into wall so that stretch is felt in back lower calf. Hold _30___ seconds. Repeat with other leg back. Repeat __3__ times. Do __2-3__ sessions per day.   Quad Sets    Slowly tighten thigh muscles of straight, left leg while counting out loud to _10 sec ___. Relax. Repeat __10__ times. Do __2__ sessions per day.   Straight Leg: with Bent Knee (Supine)    With left leg straight and toe turned out slightly, right leg bent, raise straight leg _8-10___ inches. Repeat __10__ times per set. Do __1-3__ sets per session. Do _2___ sessions per day.    Strengthening: Hip Adduction - Isometric    With ball or folded pillow between knees, squeeze knees together. Hold _10___ seconds. Repeat __10__ times per set. Do _1-3___ sets per session. Do __2__ sessions per day.

## 2015-04-25 NOTE — Therapy (Addendum)
[place], [place], [address] Phone: [phone]   Fax:  [phone]  Physical Therapy Evaluation  Patient Details  Name: Bernice Mullin MRN: [id number] Date of Birth: Dec 23, 1980 Referring Provider: Dr. Golden Pop  Encounter Date: 04/25/2015      PT End of Session - 04/25/15 1153    Visit Number 1   Number of Visits 12   Date for PT Re-Evaluation 06/06/15   PT Start Time 1059   PT Stop Time 1154   PT Time Calculation (min) 55 min   Activity Tolerance Patient tolerated treatment well      Past Medical History  Diagnosis Date  . Diabetes mellitus without complication ([place])   . Hyperlipidemia     Past Surgical History  Procedure Laterality Date  . Wisdom teeth pulled      There were no vitals filed for this visit.  Visit Diagnosis:  Left knee pain - Plan: PT plan of care cert/re-cert  Weakness of left lower extremity - Plan: PT plan of care cert/re-cert  Impaired functional mobility and endurance - Plan: PT plan of care cert/re-cert      Subjective Assessment - 04/25/15 1059    Subjective Kenly reports gradual onset of Lt knee pain which progressively worsened over the past 2 months. Received injection mid january with good improvement in pain. Recently notices the return of knee pain.    Pertinent History AODM; Torn MCL Rt knee 1997 healed with rehab no surgery    Limitations Walking   How long can you walk comfortably? 35-40 min - will have discomfort after the activity    Diagnostic tests xray   Patient Stated Goals test ROM and get knee stronger    Currently in Pain? Yes   Pain Score 3    Pain Location Knee   Pain Orientation Left   Pain Descriptors / Indicators Discomfort   Pain Type Acute pain   Pain Radiating Towards occasionally when pain is "bad" will have shooting pain into the Lt calf    Pain Onset More than a month ago   Pain Frequency Intermittent   Aggravating  Factors  impact through LE; prolonged standng/walking   Pain Relieving Factors getting off feet; ice            [facility] PT Assessment - 04/25/15 0001    Assessment   Medical Diagnosis Lt meniscus tear   Referring Provider Dr. Golden Pop   Onset Date/Surgical Date 02/11/15   Hand Dominance Right   Next MD Visit 05/24/15   Prior Therapy none   Precautions   Precaution Comments no running or jogging; no basketball   Balance Screen   Has the patient fallen in the past 6 months Yes   How many times? 2   Has the patient had a decrease in activity level because of a fear of falling?  No   Is the patient reluctant to leave their home because of a fear of falling?  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    Leisure basketball 1-2 x/ wk ~30 min; gym 2x/wk cardio and weights -machines and free weights    Observation/Other Assessments   Focus on Therapeutic Outcomes (FOTO)  49% limitation    Sensation   Additional Comments WFL's  per pt report    Posture/Postural Control   Posture Comments stands with Lt knee hyperextended; hip slightly ER    AROM   Overall AROM Comments hip; knee; ankle WFL's pain with knee flexion but 122 deg bilat    Strength   Overall Strength Comments 5/5 except Rt hip ext 5-/5; Lt hip ext 4+/5 knee flex/ext 5-/5    Flexibility   Hamstrings Rt 80 deg; Lt 70 deg    Quadriceps heel to buttock Rt 7 inches; Lt 8 inches   ITB tight Lt    Piriformis WFL's    Palpation   Palpation comment tender medial joint line Lt knee    Ambulation/Gait   Gait Comments no noticable limp                    [facility] Adult PT Treatment/Exercise - 04/25/15 0001    Therapeutic Activites    Therapeutic Activities --  instructed in ice massage for Lt knee    Knee/Hip Exercises:  Stretches   Passive Hamstring Stretch 3 reps;30 seconds   Quad Stretch 3 reps;30 seconds   ITB Stretch 3 reps;30 seconds   Gastroc Stretch 3 reps;30 seconds   Soleus Stretch 3 reps;30 seconds   Knee/Hip Exercises: Supine   Quad Sets Left;1 set;10 reps  10 sec hold    Bridges with [employer] Both;1 set;10 reps  5 sec hold    Straight Leg Raise with External Rotation Left;1 set;10 reps  5 sec    Cryotherapy   Number Minutes Cryotherapy 12 Minutes   Cryotherapy Location Knee  Lt   Type of Cryotherapy Ice pack  instructed in ice massage for home                PT Education - 04/25/15 1135    Education provided Yes   Education Details HEP    Person(s) Educated Patient   Methods Explanation;Demonstration;Tactile cues;Verbal cues;Handout   Comprehension Verbalized understanding;Returned demonstration;Verbal cues required;Tactile cues required             PT Long Term Goals - 04/25/15 1200    PT LONG TERM GOAL #1   Title Increased hamstring and quad extensibility Lt LE to equal or greater than Rt LE 06/06/15   Time 6   Period Weeks   Status New   PT LONG TERM GOAL #2   Title 5/5 LE strength 06/06/15   Time 6   Period Weeks   Status New   PT LONG TERM GOAL #3   Title Pt report decreased pain allowing him to return to normal functional activities 06/06/15   Time 6   Period Weeks   Status New   PT LONG TERM GOAL #4   Title I In HEP 06/06/15   Time 6   Period Weeks   Status New   PT LONG TERM GOAL #5   Title Improve FOTO to </= 28% limitation 06/06/15   Time 6   Period Weeks   Status New               Plan - 04/25/15 1154    Clinical Impression Statement Norris presents with Lt knee pain of ~ 2-3 months duration with no known injury. He has pain on an intermittent basis; muscular tightness through Lt LE; weakness Lt LE; limited functional activities due to pain. Pt will benefit from PT to address deficits identified.    Pt will benefit from skilled  therapeutic intervention in order to improve on  the following deficits Pain;Decreased strength;Decreased mobility;Increased fascial restricitons;Decreased activity tolerance   Rehab Potential Good   PT Frequency 2x / week   PT Duration 6 weeks   PT Treatment/Interventions Patient/family education;ADLs/Self Care Home Management;Therapeutic exercise;Therapeutic activities;Neuromuscular re-education;Manual techniques;Dry needling;Cryotherapy;Electrical Stimulation;Iontophoresis 71m/ml Dexamethasone;Moist Heat;Ultrasound   PT Next Visit Plan progress with Lt LE strengthening   PT Home Exercise Plan HEP; instructed in ice massage for medial knee    Consulted and Agree with Plan of Care Patient         Problem List Patient Active Problem List   Diagnosis Date Noted  . Lateral meniscal tear 03/13/2015  . Left knee pain 03/06/2015  . Xerosis of skin 09/21/2013  . Hyperlipidemia with target LDL less than 100 06/01/2013  . Diabetes 1.5, [place] 1 [place] 05/27/2013    Celyn PNilda SimmerPT, MPH  04/25/2015, 12:09 PM  [facility] [address]Phone: [phone]  Fax:  [phone] Name: MThadeus GandolfiMRN: [id number] of Birth: [birth year]  PHYSICAL THERAPY DISCHARGE SUMMARY  Visits from Start of Care: Evaluation only  Current functional level related to goals / functional outcomes: unchanged   Remaining deficits: unchanged   Education / Equipment: Initial HEP  Plan: Patient agrees to discharge.  Patient goals were not met. Patient is being discharged due to not returning since the last visit.  ?????     Celyn P. HHelene KelpPT, MPH 07/25/2015 12:31 PM

## 2015-04-26 ENCOUNTER — Encounter: Payer: Self-pay | Admitting: *Deleted

## 2015-04-26 ENCOUNTER — Encounter: Payer: BLUE CROSS/BLUE SHIELD | Attending: Internal Medicine | Admitting: *Deleted

## 2015-04-26 VITALS — Ht 72.0 in | Wt 262.1 lb

## 2015-04-26 DIAGNOSIS — E139 Other specified diabetes mellitus without complications: Secondary | ICD-10-CM | POA: Diagnosis present

## 2015-04-26 DIAGNOSIS — E109 Type 1 diabetes mellitus without complications: Secondary | ICD-10-CM

## 2015-04-26 NOTE — Patient Instructions (Signed)
Plan:  Aim for 3-4 Carb Choices per meal (45-60 grams) +/- 1 either way  Aim for 0-15 Carbs per snack if hungry  Include protein in moderation with your meals and snacks Consider reading food labels for Total Carbohydrate and Fat Grams of foods Consider  increasing your activity level by doing some activity for 30 minutes daily as tolerated - Even if it is only upper body and core Continue checking BG at alternate times per day as directed by MD  Continue taking medication as directed by MD  ALWAYS HAVE PROTEIN WITH EVERY MEAL AND SNACK Find something that you can grab on the way to work : [employer] Protein Bar, peanut butter sandwich, fruit & nuts

## 2015-04-26 NOTE — Progress Notes (Signed)
Diabetes Self-Management Education  Visit Type: First/Initial  Appt. Start Time: 1030 Appt. End Time: 1130  04/26/2015  Mr. Joseph [name], identified by name and date of birth, is a 35 y.o. male with a diagnosis of Diabetes: Type 2. Joseph [name] present for further Diabetes Self Management Education. His initial visit was 07/13/2013 with Bev paddock, RD, CDE. At that time his A1c was 17%, he his now down to 11%. He recently saw Dr. Everardo AllEllison and had some insulin adjustment. 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 He lives with her Girl Friend. He has a significant family history of both T1DM and T2DM.He knew that it was not a matter if IF he developed diabetes, it was a matter of when. Joseph [name] has incurred a recent knee injury compromising his ability to exercise.    ASSESSMENT      Diabetes Self-Management Education - 04/26/15 1135    Visit Information   Visit Type First/Initial   Initial Visit   Diabetes Type Type 2   Are you currently following a meal plan? No   Are you taking your medications as prescribed? Yes   Date Diagnosed 05/2013   Health Coping   How would you rate your overall health? Good   Psychosocial Assessment   Patient Belief/Attitude about Diabetes Motivated to manage diabetes   Self-care barriers None   Self-management support Doctor's office;Family;CDE visits   Other persons present Patient   Patient Concerns Nutrition/Meal planning;Medication;Monitoring;Healthy Lifestyle;Glycemic Control;Weight Control   Special Needs None   Preferred Learning Style No preference indicated   Learning Readiness Change in progress   How often do you need to have someone help you when you read instructions, pamphlets, or other written materials from your doctor or pharmacy? 2 - Rarely   Complications   Last HgB A1C per patient/outside source 11.2 %   How often do you check your blood sugar? 3-4 times/day   Fasting Blood glucose range (mg/dL) 19-14770-129    Postprandial Blood glucose range (mg/dL) [phone]   Number of hypoglycemic episodes per month 2   Can you tell when your blood sugar is low? Yes   What do you do if your blood sugar is low? shaky, jello legs   Have you had a dilated eye exam in the past 12 months? No   Have you had a dental exam in the past 12 months? No   Are you checking your feet? Yes   How many days per week are you checking your feet? 6   Dietary Intake   Breakfast chilli dogs X3, water or unsweet tea lemon, splenda   Snack (morning) pretzels, tostitos   Lunch chicken & brocolli   Dinner cantelope, honey dew melon, watermelon / omlet vegetable & cheese   Snack (evening) graze on chips while working nights,   Beverage(s) water, unsweet tea, splenda, Coke Zero   Exercise   Exercise Type ADL's  At present due to knee injury, going to PT   Patient Education   Previous Diabetes Education Yes (please comment)  [facility] few years ago   Disease state  --  Type 1.5 treated as type 1   Nutrition management  Role of diet in the treatment of diabetes and the relationship between the three main macronutrients and blood glucose level;Food label reading, portion sizes and measuring food.;Carbohydrate counting;Meal options for control of blood glucose level and chronic complications.   Physical activity and exercise  Role of exercise on  diabetes management, blood pressure control and cardiac health.   Medications Reviewed patients medication for diabetes, action, purpose, timing of dose and side effects.   Acute complications Taught treatment of hypoglycemia - the 15 rule.   Chronic complications Relationship between chronic complications and blood glucose control;Retinopathy and reason for yearly dilated eye exams   Individualized Goals (developed by patient)   Nutrition General guidelines for healthy choices and portions discussed   Medications take my medication as prescribed   Problem Solving consider alternative exercise  regimen due to knee injury   Reducing Risk increase portions of nuts and seeds   Outcomes   Expected Outcomes Demonstrated interest in learning. Expect positive outcomes   Future DMSE PRN   Program Status Completed      Individualized Plan for Diabetes Self-Management Training:   Learning Objective:  Patient will have a greater understanding of diabetes self-management. Patient education plan is to attend individual and/or group sessions per assessed needs and concerns.   Plan:   Patient Instructions  Plan:  Aim for 3-4 Carb Choices per meal (45-60 grams) +/- 1 either way  Aim for 0-15 Carbs per snack if hungry  Include protein in moderation with your meals and snacks Consider reading food labels for Total Carbohydrate and Fat Grams of foods Consider  increasing your activity level by doing some activity for 30 minutes daily as tolerated - Even if it is only upper body and core Continue checking BG at alternate times per day as directed by MD  Continue taking medication as directed by MD  ALWAYS HAVE PROTEIN WITH EVERY MEAL AND SNACK Find something that you can grab on the way to work : [employer], peanut butter sandwich, fruit & nuts  Expected Outcomes:  Demonstrated interest in learning. Expect positive outcomes  Education material provided: Meal plan card  If problems or questions, patient to contact team via:  Phone  Future DSME appointment: PRN

## 2015-05-23 ENCOUNTER — Ambulatory Visit: Payer: BLUE CROSS/BLUE SHIELD | Admitting: Endocrinology

## 2015-06-06 ENCOUNTER — Ambulatory Visit: Payer: BLUE CROSS/BLUE SHIELD | Admitting: Endocrinology

## 2015-06-08 ENCOUNTER — Encounter: Payer: Self-pay | Admitting: Endocrinology

## 2015-06-08 ENCOUNTER — Ambulatory Visit (INDEPENDENT_AMBULATORY_CARE_PROVIDER_SITE_OTHER): Payer: BLUE CROSS/BLUE SHIELD | Admitting: Endocrinology

## 2015-06-08 VITALS — BP 132/94 | HR 105 | Temp 97.7°F | Ht 72.0 in | Wt 263.0 lb

## 2015-06-08 DIAGNOSIS — E139 Other specified diabetes mellitus without complications: Secondary | ICD-10-CM | POA: Diagnosis not present

## 2015-06-08 LAB — POCT GLYCOSYLATED HEMOGLOBIN (HGB A1C): Hemoglobin A1C: 8.1

## 2015-06-08 MED ORDER — INSULIN ASPART PROT & ASPART (70-30 MIX) 100 UNIT/ML PEN: 50.0000 [IU] | PEN_INJECTOR | Freq: Two times a day (BID) | SUBCUTANEOUS | Status: DC

## 2015-06-08 NOTE — Progress Notes (Signed)
Subjective:    Patient ID: Joseph [name], male    DOB: 07-22-1980, 35 y.o.   MRN: [id number]  HPI Pt returns for f/u of diabetes mellitus: DM type: Insulin-requiring type 2 Dx'ed: 0814 Complications:  Therapy: insulin since soon after dx.   DKA: never Severe hypoglycemia: never Pancreatitis: never Other: he works 3rd shift Interval history: he has been out of insulin recently, due to a change in his insurance.  pt states he feels well in general.  He seldom checks cbg's.  Past Medical History  Diagnosis Date  . Diabetes mellitus without complication ([place])   . Hyperlipidemia     Past Surgical History  Procedure Laterality Date  . Wisdom teeth pulled      Social History   Social History  . Marital Status: Single    Spouse Name: N/A  . Number of Children: N/A  . Years of Education: N/A   Occupational History  . Not on file.   Social History Main Topics  . Smoking status: Former Smoker    Quit date: 05/28/2013  . Smokeless tobacco: Current User  . Alcohol Use: No  . Drug Use: No  . Sexual Activity: Not Currently   Other Topics Concern  . Not on file   Social History Narrative    Current Outpatient Prescriptions on File Prior to Visit  Medication Sig Dispense Refill  . ammonium lactate (LAC-HYDRIN) 12 % cream Apply topically as needed for dry skin. 385 g 3  . benzonatate (TESSALON PERLES) 100 MG capsule Take 1 capsule (100 mg total) by mouth 3 (three) times daily as needed for cough. 20 capsule 0  . Blood Glucose Monitoring Suppl (ONETOUCH VERIO FLEX SYSTEM) w/Device KIT 1 Act by Does not apply route 3 (three) times daily. 2 kit 0  . glucose blood test strip Use TID 100 each 12  . ibuprofen (ADVIL,MOTRIN) 200 MG tablet Take 400 mg by mouth every 6 (six) hours as needed for moderate pain.    . Insulin Pen Needle 31G X 5 MM MISC Daily with Insulin 100 each 3  . meloxicam (MOBIC) 15 MG tablet Take 1 tablet (15 mg total) by mouth daily. 30 tablet 0  . metFORMIN  (GLUCOPHAGE) 1000 MG tablet Take 1 tablet (1,000 mg total) by mouth 2 (two) times daily with a meal. 180 tablet 1  . rosuvastatin (CRESTOR) 10 MG tablet Take 1 tablet (10 mg total) by mouth daily. (Patient not taking: Reported on 06/08/2015) 90 tablet 3   No current facility-administered medications on file prior to visit.    No Known Allergies  Family History  Problem Relation Age of Onset  . Hypertension Maternal Grandmother   . Diabetes Father   . Cancer Neg Hx   . Early death Neg Hx   . Heart disease Neg Hx   . Hyperlipidemia Neg Hx   . Kidney disease Neg Hx   . Learning disabilities Neg Hx   . Stroke Neg Hx     BP 132/94 mmHg  Pulse 105  Temp(Src) 97.7 F (36.5 C) (Oral)  Ht 6' (1.829 m)  Wt 263 lb (119.296 kg)  BMI 35.66 kg/m2  SpO2 96%  Review of Systems He denies hypoglycemia    Objective:   Physical Exam VITAL SIGNS:  See vs page GENERAL: no distress Pulses: dorsalis pedis intact bilat.   MSK: no deformity of the feet CV: no leg edema Skin:  no ulcer on the feet, but the skin is dry.  normal temp  on the feet.  There is patchy hypopigmentation on the feet. Neuro: sensation is intact to touch on the feet  Lab Results  Component Value Date   HGBA1C 8.1 06/08/2015      Assessment & Plan:  DM: he needs increased rx Noncompliance with cbg recording and insulin, worse.    Patient is advised the following: Patient Instructions  If ever you have another gap in your insurance, you can buy several types of insulin to [facility] or [facility].  Call us if you need to , so we can talk about how much and what type.  i have sent a prescription to your pharmacy, to resume the insulin. check your blood sugar twice a day.  vary the time of day when you check, between before the 3 meals, and at bedtime.  also check if you have symptoms of your blood sugar being too high or too low.  please keep a record of the readings and bring it to your next appointment here (or you can bring  the meter itself).  You can write it on any piece of paper.  please call us sooner if your blood sugar goes below 70, or if you have a lot of readings over 200. Please come back for a follow-up appointment in 3 months.

## 2015-06-08 NOTE — Patient Instructions (Addendum)
If ever you have another gap in your insurance, you can buy several types of insulin to [facility] or [facility].  Call us if you need to , so we can talk about how much and what type.  i have sent a prescription to your pharmacy, to resume the insulin. check your blood sugar twice a day.  vary the time of day when you check, between before the 3 meals, and at bedtime.  also check if you have symptoms of your blood sugar being too high or too low.  please keep a record of the readings and bring it to your next appointment here (or you can bring the meter itself).  You can write it on any piece of paper.  please call us sooner if your blood sugar goes below 70, or if you have a lot of readings over 200. Please come back for a follow-up appointment in 3 months.

## 2015-08-13 ENCOUNTER — Telehealth: Payer: Self-pay | Admitting: Family Medicine

## 2015-08-13 NOTE — Telephone Encounter (Signed)
Pt called stating his left knee still in a lot of pain since he got the injection. He was hopping Dr. Katrinka BlazingSmith can maybe work him in sometime soon. Please call him back

## 2015-08-14 NOTE — Telephone Encounter (Signed)
Have not seen him since February.  HE will need to wait for next open slot. I think we opened up a couple more on the 7th of July.

## 2015-08-15 NOTE — Telephone Encounter (Signed)
Spoke with pt, scheduled him with 6.30.17 @ 1115am

## 2015-08-17 ENCOUNTER — Ambulatory Visit (INDEPENDENT_AMBULATORY_CARE_PROVIDER_SITE_OTHER): Payer: BLUE CROSS/BLUE SHIELD | Admitting: Family Medicine

## 2015-08-17 DIAGNOSIS — S83282D Other tear of lateral meniscus, current injury, left knee, subsequent encounter: Secondary | ICD-10-CM | POA: Diagnosis not present

## 2015-08-17 MED ORDER — MELOXICAM 15 MG PO TABS: 15.0000 mg | ORAL_TABLET | Freq: Every day | ORAL | Status: DC

## 2015-08-17 NOTE — Patient Instructions (Signed)
Good to see you  Ice 20 minutes 2 times daily. Usually after activity and before bed. Exercises 3 times a week.  Continue to wear good shoes.  If pain is not better or worse in 2 weeks call me and we will do MRI.

## 2015-08-17 NOTE — Assessment & Plan Note (Signed)
Patient given an injection. Last one was significant time ago. Over this will be beneficial. We discussed icing regimen. Patient inserted into formal physical therapy. X-ray showmild osteophytic changes. Patient continues to have pain in 2 weeks I do feel that advance imaging would be warranted. Patient is describing some internal derangement with some locking and crepitus. Refill of patient's anti-inflammatory given today.

## 2015-08-17 NOTE — Progress Notes (Signed)
Joseph [name] D.O. [facility] [address] [place], [place] [address] Phone: [phone] Subjective:    I'm seeing this patient by the request  of:  Sanda Lingerhomas Jones, MD   CC: Left knee pain follow-up  BJY:[id number]:Subjective Joseph [name] is a 35 y.o. male coming in with complaint of left knee pain. Patient was seen previously and was diagnosed with more of a lateral meniscal tear. Patient's last exam was 5 months ago. Patient did finish with formal physical therapy and was doing relatively well. Over the course last weeks it has increased in pain. So severe that is affecting his walking. Sometimes has a lacking full range of motion. Sometimes even feels unstable. Waking him up at night. Rates the severity pain is 8 out of 10. Still very similar to his presentation.     Past Medical History  Diagnosis Date  . Diabetes mellitus without complication ([facility])   . Hyperlipidemia    Past Surgical History  Procedure Laterality Date  . Wisdom teeth pulled     Social History   Social History  . Marital Status: Single    Spouse Name: N/A  . Number of Children: N/A  . Years of Education: N/A   Social History Main Topics  . Smoking status: Former Smoker    Quit date: 05/28/2013  . Smokeless tobacco: Current User  . Alcohol Use: No  . Drug Use: No  . Sexual Activity: Not Currently   Other Topics Concern  . Not on file   Social History Narrative   No Known Allergies Family History  Problem Relation Age of Onset  . Hypertension Maternal Grandmother   . Diabetes Father   . Cancer Neg Hx   . Early death Neg Hx   . Heart disease Neg Hx   . Hyperlipidemia Neg Hx   . Kidney disease Neg Hx   . Learning disabilities Neg Hx   . Stroke Neg Hx     Past medical history, social, surgical and family history all reviewed in electronic medical record.  No pertanent information unless stated regarding to the chief complaint.   Review of Systems: No headache, visual changes, nausea,  vomiting, diarrhea, constipation, dizziness, abdominal pain, skin rash, fevers, chills, night sweats, weight loss, swollen lymph nodes, body aches, joint swelling, muscle aches, chest pain, shortness of breath, mood changes.   Objective There were no vitals taken for this visit.  General: No apparent distress alert and oriented x3 mood and affect normal, dressed appropriately.  HEENT: Pupils equal, extraocular movements intact  Respiratory: Patient's speak in full sentences and does not appear short of breath  Cardiovascular: No lower extremity edema, non tender, no erythema  Skin: Warm dry intact with no signs of infection or rash on extremities or on axial skeleton.  Abdomen: Soft nontender  Neuro: Cranial nerves II through XII are intact, neurovascularly intact in all extremities with 2+ DTRs and 2+ pulses.  Lymph: No lymphadenopathy of posterior or anterior cervical chain or axillae bilaterally.  Gait normal with good balance and coordination.  MSK:  Non tender with full range of motion and good stability and symmetric strength and tone of shoulders, elbows, wrist, hip, and ankles bilaterally.  Knee: Left Normal to inspection with no erythema or effusion or obvious bony abnormalities. Tender over the medial and lateral joint line ROM full in flexion and extension and lower leg rotation. Ligaments with solid consistent endpoints including ACL, PCL, LCL, MCL. Positive Mcmurray's, Apley's, and Thessalonian tests. This is still  present illness Dennie Bibleat as well as a previous exam Non painful patellar compression. Patellar glide with mild crepitus. Patellar and quadriceps tendons unremarkable. Hamstring and quadriceps strength is normal.  Contralateral knee unremarkable         Impression and Recommendations:     This case required medical decision making of moderate complexity.      Note: This dictation was prepared with Dragon dictation along with smaller phrase technology. Any  transcriptional errors that result from this process are unintentional.

## 2015-09-05 ENCOUNTER — Other Ambulatory Visit: Payer: Self-pay | Admitting: *Deleted

## 2015-09-05 MED ORDER — INSULIN PEN NEEDLE 31G X 5 MM MISC: Status: DC

## 2015-09-07 ENCOUNTER — Ambulatory Visit (INDEPENDENT_AMBULATORY_CARE_PROVIDER_SITE_OTHER): Payer: BLUE CROSS/BLUE SHIELD | Admitting: Endocrinology

## 2015-09-07 ENCOUNTER — Encounter: Payer: Self-pay | Admitting: Endocrinology

## 2015-09-07 VITALS — BP 136/88 | HR 93 | Ht 72.0 in | Wt 271.0 lb

## 2015-09-07 DIAGNOSIS — E139 Other specified diabetes mellitus without complications: Secondary | ICD-10-CM

## 2015-09-07 LAB — POCT GLYCOSYLATED HEMOGLOBIN (HGB A1C): HEMOGLOBIN A1C: 8.3

## 2015-09-07 MED ORDER — INSULIN GLARGINE 100 UNIT/ML SOLOSTAR PEN: 100.0000 [IU] | PEN_INJECTOR | Freq: Every day | SUBCUTANEOUS | Status: DC

## 2015-09-07 NOTE — Patient Instructions (Addendum)
check your blood sugar twice a day.  vary the time of day when you check, between before the 3 meals, and at bedtime.  also check if you have symptoms of your blood sugar being too high or too low.  please keep a record of the readings and bring it to your next appointment here (or you can bring the meter itself).  You can write it on any piece of paper.  please call us sooner if your blood sugar goes below 70, or if you have a lot of readings over 200. I have sent a prescription to your pharmacy, to change your current insulin to lantus.  Please come back for a follow-up appointment in 3 months.

## 2015-09-07 NOTE — Progress Notes (Signed)
Subjective:    Patient ID: Joseph [name], male    DOB: [birth year], 35 y.o.   MRN: [id number]  HPI Pt returns for f/u of diabetes mellitus: DM type: Insulin-requiring type 2 Dx'ed: 3419 Complications:  Therapy: insulin since soon after dx.   DKA: never Severe hypoglycemia: never Pancreatitis: never Other: he works 3rd shift; he takes BID premixed insulin. Interval history: he has mild hypoglycemia approx twice per month.  no cbg record, but states cbg's vary from 50-200's.  This happens after a smaller than expected meal.  Pt says he misses the insulin 1-2 times per week.   Past Medical History  Diagnosis Date  . Diabetes mellitus without complication ([place])   . Hyperlipidemia     Past Surgical History  Procedure Laterality Date  . Wisdom teeth pulled      Social History   Social History  . Marital Status: Single    Spouse Name: N/A  . Number of Children: N/A  . Years of Education: N/A   Occupational History  . Not on file.   Social History Main Topics  . Smoking status: Former Smoker    Quit date: 05/28/2013  . Smokeless tobacco: Current User  . Alcohol Use: No  . Drug Use: No  . Sexual Activity: Not Currently   Other Topics Concern  . Not on file   Social History Narrative    Current Outpatient Prescriptions on File Prior to Visit  Medication Sig Dispense Refill  . ammonium lactate (LAC-HYDRIN) 12 % cream Apply topically as needed for dry skin. 385 g 3  . benzonatate (TESSALON PERLES) 100 MG capsule Take 1 capsule (100 mg total) by mouth 3 (three) times daily as needed for cough. 20 capsule 0  . Blood Glucose Monitoring Suppl (ONETOUCH VERIO FLEX SYSTEM) w/Device KIT 1 Act by Does not apply route 3 (three) times daily. 2 kit 0  . glucose blood test strip Use TID 100 each 12  . ibuprofen (ADVIL,MOTRIN) 200 MG tablet Take 400 mg by mouth every 6 (six) hours as needed for moderate pain.    . Insulin Pen Needle 31G X 5 MM MISC Use 1 needle daily with  insulin Dx E11.9 100 each 3  . meloxicam (MOBIC) 15 MG tablet Take 1 tablet (15 mg total) by mouth daily. 30 tablet 0  . metFORMIN (GLUCOPHAGE) 1000 MG tablet Take 1 tablet (1,000 mg total) by mouth 2 (two) times daily with a meal. 180 tablet 1  . rosuvastatin (CRESTOR) 10 MG tablet Take 1 tablet (10 mg total) by mouth daily. 90 tablet 3   No current facility-administered medications on file prior to visit.    No Known Allergies  Family History  Problem Relation Age of Onset  . Hypertension Maternal Grandmother   . Diabetes Father   . Cancer Neg Hx   . Early death Neg Hx   . Heart disease Neg Hx   . Hyperlipidemia Neg Hx   . Kidney disease Neg Hx   . Learning disabilities Neg Hx   . Stroke Neg Hx     BP 136/88 mmHg  Pulse 93  Ht 6' (1.829 m)  Wt 271 lb (122.925 kg)  BMI 36.75 kg/m2  SpO2 97%  Review of Systems Denies LOC    Objective:   Physical Exam VITAL SIGNS:  See vs page GENERAL: no distress Pulses: dorsalis pedis intact bilat.   MSK: no deformity of the feet CV: no leg edema Skin:  no ulcer on the feet,  but the skin is dry.  normal temp on the feet.  There is patchy hypopigmentation on the feet. Neuro: sensation is intact to touch on the feet Ext: There is bilateral onychomycosis of the toenails.     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   Patient is advised the following: Patient Instructions  check your blood sugar twice a day.  vary the time of day when you check, between before the 3 meals, and at bedtime.  also check if you have symptoms of your blood sugar being too high or too low.  please keep a record of the readings and bring it to your next appointment here (or you can bring the meter itself).   You can write it on any piece of paper.  please call us sooner if your blood sugar goes below 70, or if you have a lot of readings over 200. I have sent a prescription to your pharmacy, to change your current insulin to lantus.  Please come back for a follow-up appointment in 3 months.     Renato Shin, MD

## 2015-09-21 ENCOUNTER — Other Ambulatory Visit: Payer: Self-pay

## 2015-09-21 MED ORDER — BASAGLAR KWIKPEN 100 UNIT/ML ~~LOC~~ SOPN: 100.0000 [IU] | PEN_INJECTOR | Freq: Every day | SUBCUTANEOUS | 2 refills | Status: DC

## 2015-12-07 ENCOUNTER — Ambulatory Visit: Payer: BLUE CROSS/BLUE SHIELD | Admitting: Endocrinology

## 2015-12-12 ENCOUNTER — Ambulatory Visit: Payer: BLUE CROSS/BLUE SHIELD | Admitting: Endocrinology

## 2015-12-20 ENCOUNTER — Ambulatory Visit (INDEPENDENT_AMBULATORY_CARE_PROVIDER_SITE_OTHER): Payer: Self-pay | Admitting: Endocrinology

## 2015-12-20 VITALS — BP 140/84 | HR 100 | Wt 260.0 lb

## 2015-12-20 DIAGNOSIS — E139 Other specified diabetes mellitus without complications: Secondary | ICD-10-CM

## 2015-12-20 DIAGNOSIS — E109 Type 1 diabetes mellitus without complications: Secondary | ICD-10-CM

## 2015-12-20 LAB — POCT GLYCOSYLATED HEMOGLOBIN (HGB A1C): HEMOGLOBIN A1C: 8.4

## 2015-12-20 MED ORDER — BASAGLAR KWIKPEN 100 UNIT/ML ~~LOC~~ SOPN: 110.0000 [IU] | PEN_INJECTOR | Freq: Every day | SUBCUTANEOUS | 11 refills | Status: DC

## 2015-12-20 NOTE — Patient Instructions (Addendum)
check your blood sugar twice a day.  vary the time of day when you check, between before the 3 meals, and at bedtime.  also check if you have symptoms of your blood sugar being too high or too low.  please keep a record of the readings and bring it to your next appointment here (or you can bring the meter itself).  You can write it on any piece of paper.  please call us sooner if your blood sugar goes below 70, or if you have a lot of readings over 200. I have sent a prescription to your pharmacy, to increase the basaglar to 110 units each morning To help you remember it, pt it next to something you use each day, such as your toothbrush.   Please come back for a follow-up appointment in 3 months.

## 2015-12-20 NOTE — Progress Notes (Signed)
Subjective:    Patient ID: Joseph [name], male    DOB: Nov 19, 1980, 35 y.o.   MRN: [id number]  HPI Pt returns for f/u of diabetes mellitus: DM type: Insulin-requiring type 2 Dx'ed: 1856 Complications: none Therapy: insulin since soon after dx.   DKA: never Severe hypoglycemia: never Pancreatitis: never Other: he works 3rd shift; he takes QD insulin, after noncompliance with BID premixed. Interval history: no cbg record, but states cbg's are well-controlled.  pt states he feels well in general.  He misses the insulin approx once per week.  Past Medical History:  Diagnosis Date  . Diabetes mellitus without complication ([place])   . Hyperlipidemia     Past Surgical History:  Procedure Laterality Date  . wisdom teeth pulled      Social History   Social History  . Marital status: Single    Spouse name: N/A  . Number of children: N/A  . Years of education: N/A   Occupational History  . Not on file.   Social History Main Topics  . Smoking status: Former Smoker    Quit date: 05/28/2013  . Smokeless tobacco: Current User  . Alcohol use No  . Drug use: No  . Sexual activity: Not Currently   Other Topics Concern  . Not on file   Social History Narrative  . No narrative on file    Current Outpatient Prescriptions on File Prior to Visit  Medication Sig Dispense Refill  . ammonium lactate (LAC-HYDRIN) 12 % cream Apply topically as needed for dry skin. 385 g 3  . Blood Glucose Monitoring Suppl (ONETOUCH VERIO FLEX SYSTEM) w/Device KIT 1 Act by Does not apply route 3 (three) times daily. 2 kit 0  . glucose blood test strip Use TID 100 each 12  . ibuprofen (ADVIL,MOTRIN) 200 MG tablet Take 400 mg by mouth every 6 (six) hours as needed for moderate pain.    . Insulin Pen Needle 31G X 5 MM MISC Use 1 needle daily with insulin Dx E11.9 100 each 3  . meloxicam (MOBIC) 15 MG tablet Take 1 tablet (15 mg total) by mouth daily. 30 tablet 0  . metFORMIN (GLUCOPHAGE) 1000 MG tablet  Take 1 tablet (1,000 mg total) by mouth 2 (two) times daily with a meal. 180 tablet 1  . rosuvastatin (CRESTOR) 10 MG tablet Take 1 tablet (10 mg total) by mouth daily. 90 tablet 3   No current facility-administered medications on file prior to visit.     No Known Allergies  Family History  Problem Relation Age of Onset  . Hypertension Maternal Grandmother   . Diabetes Father   . Cancer Neg Hx   . Early death Neg Hx   . Heart disease Neg Hx   . Hyperlipidemia Neg Hx   . Kidney disease Neg Hx   . Learning disabilities Neg Hx   . Stroke Neg Hx    BP 140/84   Pulse 100   Wt 260 lb (117.9 kg)   SpO2 98%   BMI 35.26 kg/m   Review of Systems He denies hypoglycemia.      Objective:   Physical Exam VITAL SIGNS:  See vs page GENERAL: no distress Pulses: dorsalis pedis intact bilat.   MSK: no deformity of the feet.  CV: no leg edema.   Skin:  no ulcer on the feet, but the skin is dry.  normal temp on the feet.  There is patchy hypopigmentation on the feet.  Neuro: sensation is intact to touch  on the feet.    Ext: There is bilateral onychomycosis of the toenails.   Lab Results  Component Value Date   CREATININE 0.85 03/06/2015   BUN 14 03/06/2015   NA 139 03/06/2015   K 4.1 03/06/2015   CL 103 03/06/2015   CO2 29 03/06/2015   Lab Results  Component Value Date   MICROALBUR 1.7 03/06/2015  a1c=8.4%    Assessment & Plan:  Insulin-requiring type 2 DM: worse.  Noncompliance with cbg recording and insulin, persistent.   Patient is advised the following: Patient Instructions  check your blood sugar twice a day.  vary the time of day when you check, between before the 3 meals, and at bedtime.  also check if you have symptoms of your blood sugar being too high or too low.  please keep a record of the readings and bring it to your next appointment here (or you can bring the meter itself).  You can write it on any piece of paper.  please call us sooner if your blood sugar goes  below 70, or if you have a lot of readings over 200. I have sent a prescription to your pharmacy, to increase the basaglar to 110 units each morning To help you remember it, pt it next to something you use each day, such as your toothbrush.   Please come back for a follow-up appointment in 3 months.

## 2015-12-29 ENCOUNTER — Ambulatory Visit (INDEPENDENT_AMBULATORY_CARE_PROVIDER_SITE_OTHER): Payer: Self-pay

## 2015-12-29 ENCOUNTER — Ambulatory Visit (INDEPENDENT_AMBULATORY_CARE_PROVIDER_SITE_OTHER): Payer: Self-pay | Admitting: Physician Assistant

## 2015-12-29 ENCOUNTER — Encounter: Payer: Self-pay | Admitting: Physician Assistant

## 2015-12-29 VITALS — BP 132/86 | HR 88 | Temp 98.2°F | Resp 20 | Ht 72.0 in | Wt 256.0 lb

## 2015-12-29 DIAGNOSIS — R1084 Generalized abdominal pain: Secondary | ICD-10-CM

## 2015-12-29 DIAGNOSIS — R110 Nausea: Secondary | ICD-10-CM

## 2015-12-29 DIAGNOSIS — K5900 Constipation, unspecified: Secondary | ICD-10-CM

## 2015-12-29 LAB — POCT CBC
Granulocyte percent: 73.9 % (ref 37–80)
HCT, POC: 41.5 % — AB (ref 43.5–53.7)
Hemoglobin: 14.8 g/dL (ref 14.1–18.1)
Lymph, poc: 1.7 (ref 0.6–3.4)
MCH, POC: 31.3 pg — AB (ref 27–31.2)
MCHC: 35.7 g/dL — AB (ref 31.8–35.4)
MCV: 87.5 fL (ref 80–97)
MID (cbc): 0.4 (ref 0–0.9)
MPV: 7.2 fL (ref 0–99.8)
POC Granulocyte: 5.9 (ref 2–6.9)
POC LYMPH PERCENT: 21.7 % (ref 10–50)
POC MID %: 4.4 % (ref 0–12)
Platelet Count, POC: 272 K/uL (ref 142–424)
RBC: 4.74 M/uL (ref 4.69–6.13)
RDW, POC: 12.4 %
WBC: 8 K/uL (ref 4.6–10.2)

## 2015-12-29 LAB — POCT URINALYSIS DIP (MANUAL ENTRY)
Bilirubin, UA: NEGATIVE
Blood, UA: NEGATIVE
LEUKOCYTES UA: NEGATIVE
Nitrite, UA: NEGATIVE
Spec Grav, UA: 1.02
Urobilinogen, UA: 0.2
pH, UA: 7

## 2015-12-29 LAB — LIPASE: LIPASE: 59 U/L (ref 7–60)

## 2015-12-29 LAB — COMPLETE METABOLIC PANEL WITH GFR
ALT: 30 U/L (ref 9–46)
AST: 19 U/L (ref 10–40)
Albumin: 4.3 g/dL (ref 3.6–5.1)
Alkaline Phosphatase: 69 U/L (ref 40–115)
BUN: 10 mg/dL (ref 7–25)
CHLORIDE: 105 mmol/L (ref 98–110)
CO2: 20 mmol/L (ref 20–31)
CREATININE: 0.82 mg/dL (ref 0.60–1.35)
Calcium: 9 mg/dL (ref 8.6–10.3)
GFR, Est African American: >89 mL/min (ref >=60)
GFR, Est Non African American: >89 mL/min (ref >=60)
Glucose, Bld: 186 mg/dL — ABNORMAL HIGH (ref 65–99)
POTASSIUM: 4.1 mmol/L (ref 3.5–5.3)
SODIUM: 139 mmol/L (ref 135–146)
Total Bilirubin: 0.5 mg/dL (ref 0.2–1.2)
Total Protein: 7.7 g/dL (ref 6.1–8.1)

## 2015-12-29 LAB — GLUCOSE, POCT (MANUAL RESULT ENTRY): POC GLUCOSE: 190 mg/dL — AB (ref 70–99)

## 2015-12-29 MED ORDER — KETOROLAC TROMETHAMINE 60 MG/2ML IM SOLN
60.0000 mg | Freq: Once | INTRAMUSCULAR | Status: AC
  Administered 2015-12-29: 60 mg via INTRAMUSCULAR

## 2015-12-29 MED ORDER — ONDANSETRON 4 MG PO TBDP
8.0000 mg | ORAL_TABLET | Freq: Once | ORAL | Status: AC
  Administered 2015-12-29: 8 mg via ORAL

## 2015-12-29 MED ORDER — ONDANSETRON HCL 4 MG PO TABS: 4.0000 mg | ORAL_TABLET | Freq: Three times a day (TID) | ORAL | 0 refills | Status: DC | PRN

## 2015-12-29 NOTE — Patient Instructions (Signed)
For constipation   Make sure you are drinking enough water daily. Make sure you are getting enough fiber in your diet - this will make you regular - you can eat high fiber foods or use metamucil as a supplement - it is really important to drink enough water when using fiber supplements.  If your stools are hard or are formed balls or you have to strain a stool softener will help - use colace 2-3 capsule a day  For gentle treatment of constipation Use Miralax 1-2 capfuls a day until your stools are soft and regular and then decrease the usage - you can use this daily  For more aggressive treatment of constipation Use 4 capfuls of Colace and 6 doses of Miralax and drink it in 2 hours - this should result in several watery stools - if it does not repeat the next day and then go to daily miralax for a week to make sure your bowels are clean and retrained to work properly  For the most aggressive treatment of constipation Use 14 capfuls of Miralax in 1 gallon of fluid (gatoraid or water work well or a combination of the two) and drink over 12h - it is ok to eat during this time and then use Miralax 1 capful daily for about 2 weeks to prevent the constipation from returning 

## 2015-12-29 NOTE — Progress Notes (Signed)
12/31/2015 1:44 PM   DOB: 07-02-1980 / MRN: [id number]  SUBJECTIVE:  Joseph [name] is a 35 y.o. male presenting for "gas like" abdminal pain that started the morning.  Denies fever, chills.  Associates nausea.  He is getting worse.  He has tried Mag citrate and gas x without relief.    He pooped this morning and reoprts that his stool were normal  States it took him about 10 mintues to move his bowels.  Denies urinary symptoms.     He has No Known Allergies.   He  has a past medical history of Diabetes mellitus without complication ([facility]) and Hyperlipidemia.    He  reports that he has been smoking.  He uses smokeless tobacco. He reports that he does not drink alcohol or use drugs. He  reports that he does not currently engage in sexual activity. The patient  has a past surgical history that includes wisdom teeth pulled.  His family history includes Diabetes in his father; Hypertension in his maternal grandmother.  Review of Systems  Constitutional: Negative for chills and fever.  HENT: Negative for hearing loss.   Cardiovascular: Negative for chest pain and palpitations.  Musculoskeletal: Negative for myalgias.  Skin: Negative for itching and rash.  Neurological: Negative for dizziness.  Psychiatric/Behavioral: Negative for depression.    The problem list and medications were reviewed and updated by myself where necessary and exist elsewhere in the encounter.   OBJECTIVE:  BP 132/86 (BP Location: Right Arm, Patient Position: Sitting, Cuff Size: Normal)   Pulse 88   Temp 98.2 F (36.8 C) (Oral)   Resp 20   Ht 6' (1.829 m)   Wt 256 lb (116.1 kg)   SpO2 98%   BMI 34.72 kg/m   Physical Exam  Constitutional: He is oriented to person, place, and time.  Cardiovascular: Normal rate and regular rhythm.   Pulmonary/Chest: Effort normal and breath sounds normal.  Abdominal: Soft. Bowel sounds are normal. He exhibits no distension and no mass. There is no tenderness. There is no  rebound and no guarding.  Musculoskeletal: Normal range of motion.  Neurological: He is alert and oriented to person, place, and time. He displays normal reflexes. No cranial nerve deficit. He exhibits normal muscle tone. Coordination normal.  Skin: Skin is warm and dry.  Psychiatric: He has a normal mood and affect.    Lab Results  Component Value Date   CREATININE 0.82 12/29/2015   Lab Results  Component Value Date   ALT 30 12/29/2015   AST 19 12/29/2015   ALKPHOS 69 12/29/2015   BILITOT 0.5 12/29/2015   Lab Results  Component Value Date   NA 139 12/29/2015   K 4.1 12/29/2015   CL 105 12/29/2015   CO2 20 12/29/2015      Results for orders placed or performed in visit on 12/29/15 (from the past 72 hour(s))  POCT CBC     Status: Abnormal   Collection Time: 12/29/15  3:34 PM  Result Value Ref Range   WBC 8.0 4.6 - 10.2 K/uL   Lymph, poc 1.7 0.6 - 3.4   POC LYMPH PERCENT 21.7 10 - 50 %L   MID (cbc) 0.4 0 - 0.9   POC MID % 4.4 0 - 12 %M   POC Granulocyte 5.9 2 - 6.9   Granulocyte percent 73.9 37 - 80 %G   RBC 4.74 4.69 - 6.13 M/uL   Hemoglobin 14.8 14.1 - 18.1 g/dL   HCT, POC 40.941.5 (A)  43.5 - 53.7 %   MCV 87.5 80 - 97 fL   MCH, POC 31.3 (A) 27 - 31.2 pg   MCHC 35.7 (A) 31.8 - 35.4 g/dL   RDW, POC 16.1 %   Platelet Count, POC 272 142 - 424 K/uL   MPV 7.2 0 - 99.8 fL  POCT urinalysis dipstick     Status: Abnormal   Collection Time: 12/29/15  3:35 PM  Result Value Ref Range   Color, UA yellow yellow   Clarity, UA clear clear   Glucose, UA =500 (A) negative   Bilirubin, UA negative negative   Ketones, POC UA trace (5) (A) negative   Spec Grav, UA 1.020    Blood, UA negative negative   pH, UA 7.0    Protein Ur, POC =30 (A) negative   Urobilinogen, UA 0.2    Nitrite, UA Negative Negative   Leukocytes, UA Negative Negative  POCT glucose (manual entry)     Status: Abnormal   Collection Time: 12/29/15  3:46 PM  Result Value Ref Range   POC Glucose 190 (A) 70 - 99  mg/dl  COMPLETE METABOLIC PANEL WITH GFR     Status: Abnormal   Collection Time: 12/29/15  3:47 PM  Result Value Ref Range   Sodium 139 135 - 146 mmol/L   Potassium 4.1 3.5 - 5.3 mmol/L   Chloride 105 98 - 110 mmol/L   CO2 20 20 - 31 mmol/L   Glucose, Bld 186 (H) 65 - 99 mg/dL   BUN 10 7 - 25 mg/dL   Creat 0.96 0.45 - 4.09 mg/dL   Total Bilirubin 0.5 0.2 - 1.2 mg/dL   Alkaline Phosphatase 69 40 - 115 U/L   AST 19 10 - 40 U/L   ALT 30 9 - 46 U/L   Total Protein 7.7 6.1 - 8.1 g/dL   Albumin 4.3 3.6 - 5.1 g/dL   Calcium 9.0 8.6 - 81.1 mg/dL   GFR, Est African American >89 >=60 mL/min   GFR, Est Non African American >89 >=60 mL/min  Lipase     Status: None   Collection Time: 12/29/15  3:47 PM  Result Value Ref Range   Lipase 59 7 - 60 U/L   No results found.  ASSESSMENT AND PLAN  Joseph [name] was seen today for abdominal pain.  Diagnoses and all orders for this visit:  Generalized abdominal pain -     POCT CBC -     POCT urinalysis dipstick -     DG Abd 2 Views; Future -     POCT glucose (manual entry) -     COMPLETE METABOLIC PANEL WITH GFR -     Lipase  Nausea without vomiting -     ondansetron (ZOFRAN-ODT) disintegrating tablet 8 mg; Take 2 tablets (8 mg total) by mouth once.  Constipation, unspecified constipation type Comments: Advised bowel prep via AVS. Exam and [place] count reassuring.     Orders: -     ketorolac (TORADOL) injection 60 mg; Inject 2 mLs (60 mg total) into the muscle once.  Other orders -     ondansetron (ZOFRAN) 4 MG tablet; Take 1 tablet (4 mg total) by mouth every 8 (eight) hours as needed for nausea or vomiting.    The patient is advised to call or return to clinic if he does not see an improvement in symptoms, or to seek the care of the closest emergency department if he worsens with the above plan.   Joseph [name],  MHS, PA-C [facility] 12/31/2015 1:44 PM

## 2016-03-21 ENCOUNTER — Ambulatory Visit: Payer: BLUE CROSS/BLUE SHIELD | Admitting: Endocrinology

## 2016-04-26 ENCOUNTER — Emergency Department (HOSPITAL_COMMUNITY): Payer: BLUE CROSS/BLUE SHIELD

## 2016-04-26 ENCOUNTER — Encounter (HOSPITAL_COMMUNITY): Payer: Self-pay | Admitting: Emergency Medicine

## 2016-04-26 ENCOUNTER — Inpatient Hospital Stay (HOSPITAL_COMMUNITY)
Admission: EM | Admit: 2016-04-26 | Discharge: 2016-05-05 | DRG: 373 | Disposition: A | Discharge status: Home Health | Payer: BLUE CROSS/BLUE SHIELD | Attending: General Surgery | Admitting: General Surgery

## 2016-04-26 DIAGNOSIS — K3580 Unspecified acute appendicitis: Secondary | ICD-10-CM

## 2016-04-26 DIAGNOSIS — F17200 Nicotine dependence, unspecified, uncomplicated: Secondary | ICD-10-CM | POA: Diagnosis present

## 2016-04-26 DIAGNOSIS — Z8249 Family history of ischemic heart disease and other diseases of the circulatory system: Secondary | ICD-10-CM

## 2016-04-26 DIAGNOSIS — K353 Acute appendicitis with localized peritonitis: Secondary | ICD-10-CM | POA: Diagnosis not present

## 2016-04-26 DIAGNOSIS — K3533 Acute appendicitis with perforation and localized peritonitis, with abscess: Secondary | ICD-10-CM | POA: Diagnosis present

## 2016-04-26 DIAGNOSIS — Z9889 Other specified postprocedural states: Secondary | ICD-10-CM

## 2016-04-26 DIAGNOSIS — K651 Peritoneal abscess: Secondary | ICD-10-CM

## 2016-04-26 DIAGNOSIS — E785 Hyperlipidemia, unspecified: Secondary | ICD-10-CM | POA: Diagnosis present

## 2016-04-26 DIAGNOSIS — E139 Other specified diabetes mellitus without complications: Secondary | ICD-10-CM | POA: Diagnosis present

## 2016-04-26 DIAGNOSIS — Z91030 Bee allergy status: Secondary | ICD-10-CM

## 2016-04-26 DIAGNOSIS — Z794 Long term (current) use of insulin: Secondary | ICD-10-CM

## 2016-04-26 DIAGNOSIS — Z9119 Patient's noncompliance with other medical treatment and regimen: Secondary | ICD-10-CM

## 2016-04-26 DIAGNOSIS — Z833 Family history of diabetes mellitus: Secondary | ICD-10-CM

## 2016-04-26 DIAGNOSIS — K5900 Constipation, unspecified: Secondary | ICD-10-CM | POA: Diagnosis not present

## 2016-04-26 DIAGNOSIS — E109 Type 1 diabetes mellitus without complications: Secondary | ICD-10-CM | POA: Diagnosis present

## 2016-04-26 DIAGNOSIS — I10 Essential (primary) hypertension: Secondary | ICD-10-CM | POA: Diagnosis present

## 2016-04-26 DIAGNOSIS — R1030 Lower abdominal pain, unspecified: Secondary | ICD-10-CM | POA: Diagnosis not present

## 2016-04-26 LAB — URINALYSIS, ROUTINE W REFLEX MICROSCOPIC
Bacteria, UA: NONE SEEN
Bilirubin Urine: NEGATIVE
Glucose, UA: >=500 mg/dL — AB
Hgb urine dipstick: NEGATIVE
Ketones, ur: 5 mg/dL — AB
Leukocytes, UA: NEGATIVE
Nitrite: NEGATIVE
Protein, ur: NEGATIVE mg/dL
Specific Gravity, Urine: 1.035 — ABNORMAL HIGH (ref 1.005–1.030)
Squamous Epithelial / LPF: NONE SEEN
pH: 5 (ref 5.0–8.0)

## 2016-04-26 LAB — COMPREHENSIVE METABOLIC PANEL
ALT: 40 U/L (ref 17–63)
AST: 22 U/L (ref 15–41)
Albumin: 4 g/dL (ref 3.5–5.0)
Alkaline Phosphatase: 58 U/L (ref 38–126)
Anion gap: 5 (ref 5–15)
BUN: 16 mg/dL (ref 6–20)
CO2: 25 mmol/L (ref 22–32)
Calcium: 8.9 mg/dL (ref 8.9–10.3)
Chloride: 107 mmol/L (ref 101–111)
Creatinine, Ser: 0.72 mg/dL (ref 0.61–1.24)
GFR calc Af Amer: >60 mL/min (ref >60)
GFR calc non Af Amer: >60 mL/min (ref >60)
Glucose, Bld: 219 mg/dL — ABNORMAL HIGH (ref 65–99)
Potassium: 3.6 mmol/L (ref 3.5–5.1)
Sodium: 137 mmol/L (ref 135–145)
Total Bilirubin: 1 mg/dL (ref 0.3–1.2)
Total Protein: 7.4 g/dL (ref 6.5–8.1)

## 2016-04-26 LAB — CBG MONITORING, ED: GLUCOSE-CAPILLARY: 221 mg/dL — AB (ref 65–99)

## 2016-04-26 LAB — CBC
HCT: 42.3 % (ref 39.0–52.0)
Hemoglobin: 14.5 g/dL (ref 13.0–17.0)
MCH: 29.3 pg (ref 26.0–34.0)
MCHC: 34.3 g/dL (ref 30.0–36.0)
MCV: 85.5 fL (ref 78.0–100.0)
Platelets: 270 10*3/uL (ref 150–400)
RBC: 4.95 MIL/uL (ref 4.22–5.81)
RDW: 12.7 % (ref 11.5–15.5)
WBC: 8.5 10*3/uL (ref 4.0–10.5)

## 2016-04-26 LAB — LIPASE, BLOOD: Lipase: 27 U/L (ref 11–51)

## 2016-04-26 MED ORDER — SODIUM CHLORIDE 0.9 % IV BOLUS (SEPSIS)
1000.0000 mL | Freq: Once | INTRAVENOUS | Status: AC
  Administered 2016-04-26: 1000 mL via INTRAVENOUS

## 2016-04-26 MED ORDER — IOPAMIDOL (ISOVUE-300) INJECTION 61%
100.0000 mL | Freq: Once | INTRAVENOUS | Status: AC | PRN
  Administered 2016-04-26: 100 mL via INTRAVENOUS

## 2016-04-26 MED ORDER — IOPAMIDOL (ISOVUE-300) INJECTION 61%
INTRAVENOUS | Status: AC
  Filled 2016-04-26: qty 100

## 2016-04-26 MED ORDER — HYDROMORPHONE HCL 1 MG/ML IJ SOLN
1.0000 mg | Freq: Once | INTRAMUSCULAR | Status: AC
  Administered 2016-04-26: 1 mg via INTRAVENOUS
  Filled 2016-04-26: qty 1

## 2016-04-26 NOTE — ED Notes (Addendum)
Pt c/o lower abdominal pain no radiation. Pt states 12/17 had hx of appendicitis.

## 2016-04-26 NOTE — ED Notes (Signed)
Patient transported to CT 

## 2016-04-26 NOTE — ED Provider Notes (Signed)
[facility] DEPT Provider Note   CSN: [id number] Arrival date & time: 04/26/16  2034  By signing my name below, I, [name], attest that this documentation has been prepared under the direction and in the presence of physician practitioner, Virgel Manifold, MD. Electronically Signed: [name], Scribe. 04/26/2016. 9:35 PM.  History   Chief Complaint Chief Complaint  Patient presents with  . Abdominal Pain    The history is provided by the patient. No language interpreter was used.     HPI Comments: Joseph [name] is a 36 y.o. male with PMHx including DM and HLD who presents to the Emergency Department complaining of sudden onset, constant, severe, suprapubic pain beginning around 645 PM this evening. He states he had a bowel movement without straining and suddenly experienced significant cramping suprapubic pain that has been constant. He endorses pain radiation into his lower back. Pt states his pain is worse with position changes, standing upright, ambulating, and applied pressure to his suprapubic area. He reports improvement of his pain with laying flat. He had an appendectomy several months ago and never followed-up after surgery. Pt denies hematochezia, diarrhea, testicular pain, dysuria, difficulty urinating, nausea, or any other associated symptoms.  Past Medical History:  Diagnosis Date  . Diabetes mellitus without complication ([place])   . Hyperlipidemia     Patient Active Problem List   Diagnosis Date Noted  . Lateral meniscal tear 03/13/2015  . Left knee pain 03/06/2015  . Xerosis of skin 09/21/2013  . Hyperlipidemia with target LDL less than 100 06/01/2013  . Diabetes 1.5, managed as type 1 ([place]) 05/27/2013    Past Surgical History:  Procedure Laterality Date  . APPENDECTOMY    . wisdom teeth pulled         Home Medications    Prior to Admission medications   Medication Sig Start Date End Date Taking? Authorizing Provider  Blood Glucose Monitoring  Suppl ([place]) w/Device KIT 1 Act by Does not apply route 3 (three) times daily. 03/06/15  Yes Janith Lima, MD  glucose blood test strip Use TID 03/06/15  Yes Janith Lima, MD  ibuprofen (ADVIL,MOTRIN) 200 MG tablet Take 800 mg by mouth every 6 (six) hours as needed for moderate pain.    Yes Historical Provider, MD  Insulin Glargine (BASAGLAR KWIKPEN) 100 UNIT/ML SOPN Inject 1.1 mLs (110 Units total) into the skin at bedtime. And pen needles 3/day 12/20/15  Yes Renato Shin, MD  Insulin Pen Needle 31G X 5 MM MISC Use 1 needle daily with insulin Dx E11.9 09/05/15  Yes Janith Lima, MD  ondansetron (ZOFRAN) 4 MG tablet Take 1 tablet (4 mg total) by mouth every 8 (eight) hours as needed for nausea or vomiting. Patient not taking: Reported on 04/26/2016 12/29/15   Tereasa Coop, PA-C    Family History Family History  Problem Relation Age of Onset  . Diabetes Father   . Hypertension Maternal Grandmother   . Cancer Neg Hx   . Early death Neg Hx   . Heart disease Neg Hx   . Hyperlipidemia Neg Hx   . Kidney disease Neg Hx   . Learning disabilities Neg Hx   . Stroke Neg Hx     Social History Social History  Substance Use Topics  . Smoking status: Current Every Day Smoker    Last attempt to quit: 05/28/2013  . Smokeless tobacco: Current User  . Alcohol use No     Allergies   Bee venom  Review of Systems Review of Systems  Gastrointestinal: Positive for abdominal pain. Negative for blood in stool, diarrhea and nausea.  Genitourinary: Negative for difficulty urinating, dysuria and testicular pain.  Musculoskeletal: Positive for back pain.  All other systems reviewed and are negative.   Physical Exam Updated Vital Signs BP (!) 156/111 (BP Location: Left Arm)   Pulse 108   Temp 98.7 F (37.1 C) (Oral)   Resp 20   Ht 6' (1.829 m)   Wt 251 lb (113.9 kg)   SpO2 98%   BMI 34.04 kg/m   Physical Exam  Constitutional: He appears well-developed and  well-nourished.  HENT:  Head: Normocephalic.  Right Ear: External ear normal.  Left Ear: External ear normal.  Nose: Nose normal.  Eyes: Conjunctivae are normal. Right eye exhibits no discharge. Left eye exhibits no discharge.  Neck: Normal range of motion.  Cardiovascular: Normal rate, regular rhythm and normal heart sounds.   No murmur heard. Pulmonary/Chest: Effort normal and breath sounds normal. No respiratory distress. He has no wheezes. He has no rales.  Abdominal: Soft. There is tenderness. There is no rebound and no guarding. No hernia.  Diffuse abdominal tenderness, significantly worse in suprapubic area and RLQ. No hernia appreciated.   Musculoskeletal: Normal range of motion. He exhibits no edema or tenderness.  Neurological: He is alert. No cranial nerve deficit. Coordination normal.  Skin: Skin is warm and dry. No rash noted. No erythema. No pallor.  Psychiatric: He has a normal mood and affect. His behavior is normal.  Nursing note and vitals reviewed.   ED Treatments / Results  Labs (all labs ordered are listed, but only abnormal results are displayed) Labs Reviewed  COMPREHENSIVE METABOLIC PANEL - Abnormal; Notable for the following:       Result Value   Glucose, Bld 219 (*)    All other components within normal limits  URINALYSIS, ROUTINE W REFLEX MICROSCOPIC - Abnormal; Notable for the following:    Specific Gravity, Urine 1.035 (*)    Glucose, UA >=500 (*)    Ketones, ur 5 (*)    All other components within normal limits  CBG MONITORING, ED - Abnormal; Notable for the following:    Glucose-Capillary 221 (*)    All other components within normal limits  LIPASE, BLOOD  CBC    EKG  EKG Interpretation None       Radiology Ct Abdomen Pelvis W Contrast  Result Date: 04/27/2016 CLINICAL DATA:  Cramping in the lower abdomen. Patient reports history of appendectomy and upon 02/04/2016 with removal of infection. Patient states the pain started suddenly this  evening with feelings of constipation. EXAM: CT ABDOMEN AND PELVIS WITH CONTRAST TECHNIQUE: Multidetector CT imaging of the abdomen and pelvis was performed using the standard protocol following bolus administration of intravenous contrast. CONTRAST:  154m ISOVUE-300 IOPAMIDOL (ISOVUE-300) INJECTION 61% COMPARISON:  None. FINDINGS: Lower chest: Small hiatal hernia. There is debris in the distal esophagus possibly from reflux. Atelectasis noted at each lung base bilaterally. Top normal-sized cardiac chambers. No pericardial effusion. Hepatobiliary: The liver is homogeneous in appearance. There is no biliary dilatation. The gallbladder is unremarkable. There is a small amount of subhepatic fluid adjacent to the right hepatic lobe extending along the right pericolic gutter. Pancreas: Unremarkable. No pancreatic ductal dilatation or surrounding inflammatory changes. Spleen: Normal in size without focal abnormality. Adrenals/Urinary Tract: Adrenal glands are unremarkable. Kidneys are normal, without renal calculi, focal lesion, or hydronephrosis. Bladder is unremarkable. Stomach/Bowel: There is a tubular inflamed  structure in the right lower quadrant measuring up to 14 mm in diameter with adjacent mesenteric fatty inflammation. Additionally there are 2 surgical clips just medial to this tubular structure which may represent sequela of prior reported appendectomy. This current finding may represent inflammation of an appendiceal remnant. The visualized stomach, small and large intestine are unremarkable apart from a large volume of stool in the region of the cecum and scattered colonic diverticulosis along the sigmoid. Vascular/Lymphatic: No significant vascular findings are present. No enlarged abdominal or pelvic lymph nodes. A small reactive lymph node is seen in the right lower quadrant mesenteric measuring 6 mm short axis. Reproductive: Normal size prostate. Other: Small amount of free fluid in the pelvis and along  the right pericolic gutter. Musculoskeletal: Nonacute IMPRESSION: 1. Inflamed, tubular blind-ending appearing structure in the right lower quadrant measuring up to 14 mm in diameter suspicious for appendicitis possibly of an appendiceal remnant given patient's history of appendectomy. This may actually represent the appendix itself as two surgical clips are seen medial to this structure that are also separate from the base of the cecum. Correlation with operative report and pathology be helpful. 2. Small amount of free fluid in the right quadrant, pelvis and right paracolic gutter. Electronically Signed   By: Ashley Royalty M.D.   On: 04/27/2016 00:49    Procedures Procedures (including critical care time)  DIAGNOSTIC STUDIES: Oxygen Saturation is 98% on RA, normal by my interpretation.    COORDINATION OF CARE: 9:41 PM Discussed treatment plan with pt at bedside and pt agreed to plan.  Medications Ordered in ED Medications - No data to display   Initial Impression / Assessment and Plan / ED Course  I have reviewed the triage vital signs and the nursing notes.  Pertinent labs & imaging results that were available during my care of the patient were reviewed by me and considered in my medical decision making (see chart for details).     36yM with RLQ pain. Imaging as above. His prior surgery was in [place], [place]. He reports he had "an abscess around my appendix that they had to clean out." Abx. Surgery consultation.   Final Clinical Impressions(s) / ED Diagnoses   Final diagnoses:  Acute appendicitis, unspecified acute appendicitis type    New Prescriptions New Prescriptions   No medications on file   I personally preformed the services scribed in my presence. The recorded information has been reviewed is accurate. Virgel Manifold, MD.    Virgel Manifold, MD 04/27/16 [phone]

## 2016-04-26 NOTE — ED Triage Notes (Signed)
Pt reports having cramping pain to lower abd that started around 1845 tonight. Pt states that he had appendectomy on 02/04/16 and removal of infection from abd there after. Pt states that pain started suddenly tonight. And has feeling of constipation.

## 2016-04-27 DIAGNOSIS — Z8249 Family history of ischemic heart disease and other diseases of the circulatory system: Secondary | ICD-10-CM | POA: Diagnosis not present

## 2016-04-27 DIAGNOSIS — E785 Hyperlipidemia, unspecified: Secondary | ICD-10-CM | POA: Diagnosis present

## 2016-04-27 DIAGNOSIS — E109 Type 1 diabetes mellitus without complications: Secondary | ICD-10-CM | POA: Diagnosis present

## 2016-04-27 DIAGNOSIS — Z833 Family history of diabetes mellitus: Secondary | ICD-10-CM | POA: Diagnosis not present

## 2016-04-27 DIAGNOSIS — Z794 Long term (current) use of insulin: Secondary | ICD-10-CM | POA: Diagnosis not present

## 2016-04-27 DIAGNOSIS — R1030 Lower abdominal pain, unspecified: Secondary | ICD-10-CM | POA: Diagnosis present

## 2016-04-27 DIAGNOSIS — Z9119 Patient's noncompliance with other medical treatment and regimen: Secondary | ICD-10-CM | POA: Diagnosis not present

## 2016-04-27 DIAGNOSIS — K353 Acute appendicitis with localized peritonitis: Secondary | ICD-10-CM | POA: Diagnosis present

## 2016-04-27 DIAGNOSIS — I10 Essential (primary) hypertension: Secondary | ICD-10-CM | POA: Diagnosis present

## 2016-04-27 DIAGNOSIS — K3533 Acute appendicitis with perforation and localized peritonitis, with abscess: Secondary | ICD-10-CM | POA: Diagnosis present

## 2016-04-27 DIAGNOSIS — F17200 Nicotine dependence, unspecified, uncomplicated: Secondary | ICD-10-CM | POA: Diagnosis present

## 2016-04-27 DIAGNOSIS — K5900 Constipation, unspecified: Secondary | ICD-10-CM | POA: Diagnosis not present

## 2016-04-27 DIAGNOSIS — Z91030 Bee allergy status: Secondary | ICD-10-CM | POA: Diagnosis not present

## 2016-04-27 LAB — GLUCOSE, CAPILLARY
GLUCOSE-CAPILLARY: 164 mg/dL — AB (ref 65–99)
GLUCOSE-CAPILLARY: 154 mg/dL — AB (ref 65–99)
Glucose-Capillary: 195 mg/dL — ABNORMAL HIGH (ref 65–99)
Glucose-Capillary: 179 mg/dL — ABNORMAL HIGH (ref 65–99)

## 2016-04-27 MED ORDER — MORPHINE SULFATE (PF) 4 MG/ML IV SOLN
1.0000 mg | INTRAVENOUS | Status: DC | PRN
  Administered 2016-04-27 – 2016-04-28 (×4): 2 mg via INTRAVENOUS
  Filled 2016-04-27 (×4): qty 1

## 2016-04-27 MED ORDER — HEPARIN SODIUM (PORCINE) 5000 UNIT/ML IJ SOLN
5000.0000 [IU] | Freq: Three times a day (TID) | INTRAMUSCULAR | Status: DC
  Administered 2016-04-27 – 2016-05-01 (×13): 5000 [IU] via SUBCUTANEOUS
  Filled 2016-04-27 (×13): qty 1

## 2016-04-27 MED ORDER — PIPERACILLIN-TAZOBACTAM 3.375 G IVPB
3.3750 g | Freq: Three times a day (TID) | INTRAVENOUS | Status: DC
  Administered 2016-04-27 – 2016-05-05 (×24): 3.375 g via INTRAVENOUS
  Filled 2016-04-27 (×26): qty 50

## 2016-04-27 MED ORDER — ONDANSETRON 4 MG PO TBDP: 4.0000 mg | ORAL_TABLET | Freq: Four times a day (QID) | ORAL | Status: DC | PRN

## 2016-04-27 MED ORDER — INSULIN ASPART 100 UNIT/ML ~~LOC~~ SOLN
0.0000 [IU] | Freq: Three times a day (TID) | SUBCUTANEOUS | Status: DC
  Administered 2016-04-27 (×3): 2 [IU] via SUBCUTANEOUS
  Administered 2016-04-28: 1 [IU] via SUBCUTANEOUS
  Administered 2016-04-28 (×2): 2 [IU] via SUBCUTANEOUS
  Administered 2016-04-29: 1 [IU] via SUBCUTANEOUS
  Administered 2016-04-29 (×2): 2 [IU] via SUBCUTANEOUS
  Administered 2016-04-30 (×2): 1 [IU] via SUBCUTANEOUS
  Administered 2016-04-30: 2 [IU] via SUBCUTANEOUS
  Administered 2016-05-01: 1 [IU] via SUBCUTANEOUS
  Administered 2016-05-01: 100 [IU] via SUBCUTANEOUS
  Administered 2016-05-01 – 2016-05-02 (×2): 1 [IU] via SUBCUTANEOUS
  Administered 2016-05-02 – 2016-05-03 (×2): 2 [IU] via SUBCUTANEOUS
  Administered 2016-05-03: 1 [IU] via SUBCUTANEOUS
  Administered 2016-05-04 (×2): 2 [IU] via SUBCUTANEOUS
  Administered 2016-05-05 (×2): 1 [IU] via SUBCUTANEOUS

## 2016-04-27 MED ORDER — PANTOPRAZOLE SODIUM 40 MG IV SOLR
40.0000 mg | Freq: Every day | INTRAVENOUS | Status: DC
  Administered 2016-04-27 – 2016-05-03 (×7): 40 mg via INTRAVENOUS
  Filled 2016-04-27 (×7): qty 40

## 2016-04-27 MED ORDER — POTASSIUM CHLORIDE IN NACL 20-0.9 MEQ/L-% IV SOLN
INTRAVENOUS | Status: DC
  Administered 2016-04-27 – 2016-04-30 (×6): via INTRAVENOUS
  Filled 2016-04-27 (×9): qty 1000

## 2016-04-27 MED ORDER — DEXTROSE 5 % IV SOLN
2.0000 g | Freq: Once | INTRAVENOUS | Status: AC
  Administered 2016-04-27: 2 g via INTRAVENOUS
  Filled 2016-04-27: qty 2

## 2016-04-27 MED ORDER — METRONIDAZOLE IN NACL 5-0.79 MG/ML-% IV SOLN
500.0000 mg | Freq: Once | INTRAVENOUS | Status: AC
  Administered 2016-04-27: 500 mg via INTRAVENOUS
  Filled 2016-04-27: qty 100

## 2016-04-27 MED ORDER — ONDANSETRON HCL 4 MG/2ML IJ SOLN
4.0000 mg | Freq: Four times a day (QID) | INTRAMUSCULAR | Status: DC | PRN
  Administered 2016-04-28 – 2016-04-29 (×3): 4 mg via INTRAVENOUS
  Filled 2016-04-27 (×3): qty 2

## 2016-04-27 MED ORDER — HYDROCODONE-ACETAMINOPHEN 5-325 MG PO TABS
1.0000 | ORAL_TABLET | ORAL | Status: DC | PRN
  Administered 2016-04-27 – 2016-05-05 (×14): 2 via ORAL
  Filled 2016-04-27 (×14): qty 2

## 2016-04-27 MED ORDER — HYDROMORPHONE HCL 1 MG/ML IJ SOLN
1.0000 mg | Freq: Once | INTRAMUSCULAR | Status: AC
  Administered 2016-04-27: 1 mg via INTRAVENOUS
  Filled 2016-04-27: qty 1

## 2016-04-27 NOTE — H&P (Signed)
Joseph [name] is an 36 y.o. male.   Chief Complaint: abdominal pain HPI:  The patient is a 36 year old black male who presents with a 1 day history of lower abdominal pain. He states that the pain starts with the feeling of constipation. The pain worsened throughout the day so he came to the emergency department. A CT scan was performed that shows what appears to be some appendicitis but no evidence of abscess or perforation. He states that he had a drainage of an abscess and at least a partial appendectomy in [place] in December. He was placed on antibiotics and got better. He was supposed to follow-up for a definitive appendectomy but never did. He denies any fevers or chills. He denies any nausea or vomiting.  Past Medical History:  Diagnosis Date  . Diabetes mellitus without complication ([place])   . Hyperlipidemia     Past Surgical History:  Procedure Laterality Date  . APPENDECTOMY    . wisdom teeth pulled      Family History  Problem Relation Age of Onset  . Diabetes Father   . Hypertension Maternal Grandmother   . Cancer Neg Hx   . Early death Neg Hx   . Heart disease Neg Hx   . Hyperlipidemia Neg Hx   . Kidney disease Neg Hx   . Learning disabilities Neg Hx   . Stroke Neg Hx    Social History:  reports that he has been smoking.  He uses smokeless tobacco. He reports that he does not drink alcohol or use drugs.  Allergies:  Allergies  Allergen Reactions  . Bee Venom Anaphylaxis     (Not in a hospital admission)  Results for orders placed or performed during the hospital encounter of 04/26/16 (from the past 48 hour(s))  CBG monitoring, ED     Status: Abnormal   Collection Time: 04/26/16  8:47 PM  Result Value Ref Range   Glucose-Capillary 221 (H) 65 - 99 mg/dL  Urinalysis, Routine w reflex microscopic     Status: Abnormal   Collection Time: 04/26/16  9:56 PM  Result Value Ref Range   Color, Urine YELLOW YELLOW   APPearance CLEAR CLEAR   Specific Gravity, Urine  1.035 (H) 1.005 - 1.030   pH 5.0 5.0 - 8.0   Glucose, UA >=500 (A) NEGATIVE mg/dL   Hgb urine dipstick NEGATIVE NEGATIVE   Bilirubin Urine NEGATIVE NEGATIVE   Ketones, ur 5 (A) NEGATIVE mg/dL   Protein, ur NEGATIVE NEGATIVE mg/dL   Nitrite NEGATIVE NEGATIVE   Leukocytes, UA NEGATIVE NEGATIVE   RBC / HPF 0-5 0 - 5 RBC/hpf   WBC, UA 0-5 0 - 5 WBC/hpf   Bacteria, UA NONE SEEN NONE SEEN   Squamous Epithelial / LPF NONE SEEN NONE SEEN   Mucous PRESENT   Lipase, blood     Status: None   Collection Time: 04/26/16 10:14 PM  Result Value Ref Range   Lipase 27 11 - 51 U/L  Comprehensive metabolic panel     Status: Abnormal   Collection Time: 04/26/16 10:14 PM  Result Value Ref Range   Sodium 137 135 - 145 mmol/L   Potassium 3.6 3.5 - 5.1 mmol/L   Chloride 107 101 - 111 mmol/L   CO2 25 22 - 32 mmol/L   Glucose, Bld 219 (H) 65 - 99 mg/dL   BUN 16 6 - 20 mg/dL   Creatinine, Ser 0.72 0.61 - 1.24 mg/dL   Calcium 8.9 8.9 - 10.3 mg/dL  Total Protein 7.4 6.5 - 8.1 g/dL   Albumin 4.0 3.5 - 5.0 g/dL   AST 22 15 - 41 U/L   ALT 40 17 - 63 U/L   Alkaline Phosphatase 58 38 - 126 U/L   Total Bilirubin 1.0 0.3 - 1.2 mg/dL   GFR calc non Af Amer >60 >60 mL/min   GFR calc Af Amer >60 >60 mL/min    Comment: (NOTE) The eGFR has been calculated using the CKD EPI equation. This calculation has not been validated in all clinical situations. eGFR's persistently <60 mL/min signify possible Chronic Kidney Disease.    Anion gap 5 5 - 15  CBC     Status: None   Collection Time: 04/26/16 10:14 PM  Result Value Ref Range   WBC 8.5 4.0 - 10.5 K/uL   RBC 4.95 4.22 - 5.81 MIL/uL   Hemoglobin 14.5 13.0 - 17.0 g/dL   HCT 42.3 39.0 - 52.0 %   MCV 85.5 78.0 - 100.0 fL   MCH 29.3 26.0 - 34.0 pg   MCHC 34.3 30.0 - 36.0 g/dL   RDW 12.7 11.5 - 15.5 %   Platelets 270 150 - 400 K/uL   Ct Abdomen Pelvis W Contrast  Result Date: 04/27/2016 CLINICAL DATA:  Cramping in the lower abdomen. Patient reports history  of appendectomy and upon 02/04/2016 with removal of infection. Patient states the pain started suddenly this evening with feelings of constipation. EXAM: CT ABDOMEN AND PELVIS WITH CONTRAST TECHNIQUE: Multidetector CT imaging of the abdomen and pelvis was performed using the standard protocol following bolus administration of intravenous contrast. CONTRAST:  130m ISOVUE-300 IOPAMIDOL (ISOVUE-300) INJECTION 61% COMPARISON:  None. FINDINGS: Lower chest: Small hiatal hernia. There is debris in the distal esophagus possibly from reflux. Atelectasis noted at each lung base bilaterally. Top normal-sized cardiac chambers. No pericardial effusion. Hepatobiliary: The liver is homogeneous in appearance. There is no biliary dilatation. The gallbladder is unremarkable. There is a small amount of subhepatic fluid adjacent to the right hepatic lobe extending along the right pericolic gutter. Pancreas: Unremarkable. No pancreatic ductal dilatation or surrounding inflammatory changes. Spleen: Normal in size without focal abnormality. Adrenals/Urinary Tract: Adrenal glands are unremarkable. Kidneys are normal, without renal calculi, focal lesion, or hydronephrosis. Bladder is unremarkable. Stomach/Bowel: There is a tubular inflamed structure in the right lower quadrant measuring up to 14 mm in diameter with adjacent mesenteric fatty inflammation. Additionally there are 2 surgical clips just medial to this tubular structure which may represent sequela of prior reported appendectomy. This current finding may represent inflammation of an appendiceal remnant. The visualized stomach, small and large intestine are unremarkable apart from a large volume of stool in the region of the cecum and scattered colonic diverticulosis along the sigmoid. Vascular/Lymphatic: No significant vascular findings are present. No enlarged abdominal or pelvic lymph nodes. A small reactive lymph node is seen in the right lower quadrant mesenteric measuring 6  mm short axis. Reproductive: Normal size prostate. Other: Small amount of free fluid in the pelvis and along the right pericolic gutter. Musculoskeletal: Nonacute IMPRESSION: 1. Inflamed, tubular blind-ending appearing structure in the right lower quadrant measuring up to 14 mm in diameter suspicious for appendicitis possibly of an appendiceal remnant given patient's history of appendectomy. This may actually represent the appendix itself as two surgical clips are seen medial to this structure that are also separate from the base of the cecum. Correlation with operative report and pathology be helpful. 2. Small amount of free fluid in the  right quadrant, pelvis and right paracolic gutter. Electronically Signed   By: Ashley Royalty M.D.   On: 04/27/2016 00:49    Review of Systems  Constitutional: Negative.   HENT: Negative.   Eyes: Negative.   Respiratory: Negative.   Cardiovascular: Negative.   Gastrointestinal: Positive for abdominal pain and constipation.  Genitourinary: Negative.   Musculoskeletal: Negative.   Skin: Negative.   Neurological: Negative.   Endo/Heme/Allergies: Negative.   Psychiatric/Behavioral: Negative.     Blood pressure 155/93, pulse 110, temperature 98.7 F (37.1 C), temperature source Oral, resp. rate 18, height 6' (1.829 m), weight 113.9 kg (251 lb), SpO2 93 %. Physical Exam  Constitutional: He is oriented to person, place, and time. He appears well-developed and well-nourished.  HENT:  Head: Normocephalic and atraumatic.  Eyes: Conjunctivae and EOM are normal. Pupils are equal, round, and reactive to light.  Neck: Normal range of motion. Neck supple.  Cardiovascular: Normal rate, regular rhythm and normal heart sounds.   Respiratory: Effort normal and breath sounds normal.  GI: Soft.  Moderate tenderness mostly in RLQ  Musculoskeletal: Normal range of motion.  Neurological: He is alert and oriented to person, place, and time.  Skin: Skin is warm and dry.   Psychiatric: He has a normal mood and affect. His behavior is normal.     Assessment/Plan  The patient appears to have a recurrence of appendicitis of his appendiceal stump. Given his recent surgery and the fact that he is currently afebrile with a normal [place] count at that would be reasonable to treat him with antibiotics. If we can get him over the acute infection then he will need to follow up in 6-8 weeks for an interval appendectomy. I have discussed with him the different options for treatment and he agrees with this treatment plan. We will admit him to the hospital and start him on IV Zosyn. If he does not improve over the next 24-48 hours then he may require surgery.  Merrie Roof, MD 04/27/2016, 5:59 AM

## 2016-04-27 NOTE — Progress Notes (Signed)
Patient ID: Joseph [name], male   DOB: 04-09-1980, 36 y.o.   MRN: [id number] [facility] Surgery Progress Note:   * No surgery found *  Subjective: Mental status is clear.   Objective: Vital signs in last 24 hours: Temp:  [98.7 F (37.1 C)-99.2 F (37.3 C)] 99.1 F (37.3 C) (03/11 0953) Pulse Rate:  [107-133] 124 (03/11 0953) Resp:  [18-20] 20 (03/11 0953) BP: (104-161)/(85-111) 144/97 (03/11 0953) SpO2:  [93 %-98 %] 96 % (03/11 0953) Weight:  [109.9 kg (242 lb 4.8 oz)-114.5 kg (252 lb 6.4 oz)] 114.5 kg (252 lb 6.4 oz) (03/11 0658)  Intake/Output from previous day: 03/10 0701 - 03/11 0700 In: 1150 [IV Piggyback:1150] Out: 400 [Urine:400] Intake/Output this shift: No intake/output data recorded.  Physical Exam: Work of breathing is normal.  Pain is a little better  Lab Results:  Results for orders placed or performed during the hospital encounter of 04/26/16 (from the past 48 hour(s))  CBG monitoring, ED     Status: Abnormal   Collection Time: 04/26/16  8:47 PM  Result Value Ref Range   Glucose-Capillary 221 (H) 65 - 99 mg/dL  Urinalysis, Routine w reflex microscopic     Status: Abnormal   Collection Time: 04/26/16  9:56 PM  Result Value Ref Range   Color, Urine YELLOW YELLOW   APPearance CLEAR CLEAR   Specific Gravity, Urine 1.035 (H) 1.005 - 1.030   pH 5.0 5.0 - 8.0   Glucose, UA >=500 (A) NEGATIVE mg/dL   Hgb urine dipstick NEGATIVE NEGATIVE   Bilirubin Urine NEGATIVE NEGATIVE   Ketones, ur 5 (A) NEGATIVE mg/dL   Protein, ur NEGATIVE NEGATIVE mg/dL   Nitrite NEGATIVE NEGATIVE   Leukocytes, UA NEGATIVE NEGATIVE   RBC / HPF 0-5 0 - 5 RBC/hpf   WBC, UA 0-5 0 - 5 WBC/hpf   Bacteria, UA NONE SEEN NONE SEEN   Squamous Epithelial / LPF NONE SEEN NONE SEEN   Mucous PRESENT   Lipase, blood     Status: None   Collection Time: 04/26/16 10:14 PM  Result Value Ref Range   Lipase 27 11 - 51 U/L  Comprehensive metabolic panel     Status: Abnormal   Collection Time:  04/26/16 10:14 PM  Result Value Ref Range   Sodium 137 135 - 145 mmol/L   Potassium 3.6 3.5 - 5.1 mmol/L   Chloride 107 101 - 111 mmol/L   CO2 25 22 - 32 mmol/L   Glucose, Bld 219 (H) 65 - 99 mg/dL   BUN 16 6 - 20 mg/dL   Creatinine, Ser 0.72 0.61 - 1.24 mg/dL   Calcium 8.9 8.9 - 10.3 mg/dL   Total Protein 7.4 6.5 - 8.1 g/dL   Albumin 4.0 3.5 - 5.0 g/dL   AST 22 15 - 41 U/L   ALT 40 17 - 63 U/L   Alkaline Phosphatase 58 38 - 126 U/L   Total Bilirubin 1.0 0.3 - 1.2 mg/dL   GFR calc non Af Amer >60 >60 mL/min   GFR calc Af Amer >60 >60 mL/min    Comment: (NOTE) The eGFR has been calculated using the CKD EPI equation. This calculation has not been validated in all clinical situations. eGFR's persistently <60 mL/min signify possible Chronic Kidney Disease.    Anion gap 5 5 - 15  CBC     Status: None   Collection Time: 04/26/16 10:14 PM  Result Value Ref Range   WBC 8.5 4.0 - 10.5 K/uL  RBC 4.95 4.22 - 5.81 MIL/uL   Hemoglobin 14.5 13.0 - 17.0 g/dL   HCT 42.3 39.0 - 52.0 %   MCV 85.5 78.0 - 100.0 fL   MCH 29.3 26.0 - 34.0 pg   MCHC 34.3 30.0 - 36.0 g/dL   RDW 12.7 11.5 - 15.5 %   Platelets 270 150 - 400 K/uL  Glucose, capillary     Status: Abnormal   Collection Time: 04/27/16  7:36 AM  Result Value Ref Range   Glucose-Capillary 195 (H) 65 - 99 mg/dL    Radiology/Results: Ct Abdomen Pelvis W Contrast  Result Date: 04/27/2016 CLINICAL DATA:  Cramping in the lower abdomen. Patient reports history of appendectomy and upon 02/04/2016 with removal of infection. Patient states the pain started suddenly this evening with feelings of constipation. EXAM: CT ABDOMEN AND PELVIS WITH CONTRAST TECHNIQUE: Multidetector CT imaging of the abdomen and pelvis was performed using the standard protocol following bolus administration of intravenous contrast. CONTRAST:  161m ISOVUE-300 IOPAMIDOL (ISOVUE-300) INJECTION 61% COMPARISON:  None. FINDINGS: Lower chest: Small hiatal hernia. There is  debris in the distal esophagus possibly from reflux. Atelectasis noted at each lung base bilaterally. Top normal-sized cardiac chambers. No pericardial effusion. Hepatobiliary: The liver is homogeneous in appearance. There is no biliary dilatation. The gallbladder is unremarkable. There is a small amount of subhepatic fluid adjacent to the right hepatic lobe extending along the right pericolic gutter. Pancreas: Unremarkable. No pancreatic ductal dilatation or surrounding inflammatory changes. Spleen: Normal in size without focal abnormality. Adrenals/Urinary Tract: Adrenal glands are unremarkable. Kidneys are normal, without renal calculi, focal lesion, or hydronephrosis. Bladder is unremarkable. Stomach/Bowel: There is a tubular inflamed structure in the right lower quadrant measuring up to 14 mm in diameter with adjacent mesenteric fatty inflammation. Additionally there are 2 surgical clips just medial to this tubular structure which may represent sequela of prior reported appendectomy. This current finding may represent inflammation of an appendiceal remnant. The visualized stomach, small and large intestine are unremarkable apart from a large volume of stool in the region of the cecum and scattered colonic diverticulosis along the sigmoid. Vascular/Lymphatic: No significant vascular findings are present. No enlarged abdominal or pelvic lymph nodes. A small reactive lymph node is seen in the right lower quadrant mesenteric measuring 6 mm short axis. Reproductive: Normal size prostate. Other: Small amount of free fluid in the pelvis and along the right pericolic gutter. Musculoskeletal: Nonacute IMPRESSION: 1. Inflamed, tubular blind-ending appearing structure in the right lower quadrant measuring up to 14 mm in diameter suspicious for appendicitis possibly of an appendiceal remnant given patient's history of appendectomy. This may actually represent the appendix itself as two surgical clips are seen medial to  this structure that are also separate from the base of the cecum. Correlation with operative report and pathology be helpful. 2. Small amount of free fluid in the right quadrant, pelvis and right paracolic gutter. Electronically Signed   By: DAshley RoyaltyM.D.   On: 04/27/2016 00:49    Anti-infectives: Anti-infectives    Start     Dose/Rate Route Frequency Ordered Stop   04/27/16 0745  piperacillin-tazobactam (ZOSYN) IVPB 3.375 g     3.375 g 12.5 mL/hr over 240 Minutes Intravenous Every 8 hours 04/27/16 0731     04/27/16 0115  metroNIDAZOLE (FLAGYL) IVPB 500 mg     500 mg 100 mL/hr over 60 Minutes Intravenous  Once 04/27/16 0103 04/27/16 0424   04/27/16 0115  cefTRIAXone (ROCEPHIN) 2 g in dextrose 5 %  50 mL IVPB     2 g 100 mL/hr over 30 Minutes Intravenous  Once 04/27/16 0103 04/27/16 0305      Assessment/Plan: Problem List: Patient Active Problem List   Diagnosis Date Noted  . Appendicitis 04/27/2016  . Lateral meniscal tear 03/13/2015  . Left knee pain 03/06/2015  . Xerosis of skin 09/21/2013  . Hyperlipidemia with target LDL less than 100 06/01/2013  . Diabetes 1.5, managed as type 1 ([place]) 05/27/2013    Case discussed with Dr. Marlou Starks and I agree with antibiotic treatment and cool down period before interval appendectomy.   * No surgery found *    LOS: 0 days   Matt B. Hassell Done, MD, [facility] [phone] beeper [phone]  04/27/2016 11:58 AM

## 2016-04-28 LAB — BASIC METABOLIC PANEL
Anion gap: 9 (ref 5–15)
BUN: 9 mg/dL (ref 6–20)
CHLORIDE: 107 mmol/L (ref 101–111)
CO2: 21 mmol/L — AB (ref 22–32)
CREATININE: 0.78 mg/dL (ref 0.61–1.24)
Calcium: 8.6 mg/dL — ABNORMAL LOW (ref 8.9–10.3)
GFR calc Af Amer: >60 mL/min (ref >60)
GFR calc non Af Amer: >60 mL/min (ref >60)
Glucose, Bld: 180 mg/dL — ABNORMAL HIGH (ref 65–99)
Potassium: 3.6 mmol/L (ref 3.5–5.1)
Sodium: 137 mmol/L (ref 135–145)

## 2016-04-28 LAB — HIV ANTIBODY (ROUTINE TESTING W REFLEX): HIV SCREEN 4TH GENERATION: NONREACTIVE

## 2016-04-28 LAB — CBC
HEMATOCRIT: 42.7 % (ref 39.0–52.0)
Hemoglobin: 14.4 g/dL (ref 13.0–17.0)
MCH: 29.1 pg (ref 26.0–34.0)
MCHC: 33.7 g/dL (ref 30.0–36.0)
MCV: 86.3 fL (ref 78.0–100.0)
PLATELETS: 261 10*3/uL (ref 150–400)
RBC: 4.95 MIL/uL (ref 4.22–5.81)
RDW: 13 % (ref 11.5–15.5)
WBC: 13.3 10*3/uL — ABNORMAL HIGH (ref 4.0–10.5)

## 2016-04-28 LAB — GLUCOSE, CAPILLARY
GLUCOSE-CAPILLARY: 188 mg/dL — AB (ref 65–99)
Glucose-Capillary: 157 mg/dL — ABNORMAL HIGH (ref 65–99)
Glucose-Capillary: 167 mg/dL — ABNORMAL HIGH (ref 65–99)
Glucose-Capillary: 142 mg/dL — ABNORMAL HIGH (ref 65–99)

## 2016-04-28 LAB — HEMOGLOBIN A1C
Hgb A1c MFr Bld: 10.3 % — ABNORMAL HIGH (ref 4.8–5.6)
Mean Plasma Glucose: 249 mg/dL

## 2016-04-28 MED ORDER — MORPHINE SULFATE (PF) 4 MG/ML IV SOLN
1.0000 mg | INTRAVENOUS | Status: DC | PRN
  Administered 2016-04-28 (×3): 3 mg via INTRAVENOUS
  Administered 2016-04-28: 2 mg via INTRAVENOUS
  Administered 2016-04-29: 3 mg via INTRAVENOUS
  Administered 2016-04-29: 2 mg via INTRAVENOUS
  Administered 2016-04-29: 4 mg via INTRAVENOUS
  Filled 2016-04-28 (×7): qty 1

## 2016-04-28 MED ORDER — METHOCARBAMOL 1000 MG/10ML IJ SOLN
500.0000 mg | Freq: Three times a day (TID) | INTRAVENOUS | Status: DC | PRN
  Administered 2016-04-28 – 2016-05-01 (×4): 500 mg via INTRAVENOUS
  Filled 2016-04-28 (×4): qty 5
  Filled 2016-04-28 (×4): qty 550

## 2016-04-28 MED ORDER — ZOLPIDEM TARTRATE 5 MG PO TABS
5.0000 mg | ORAL_TABLET | Freq: Every evening | ORAL | Status: DC | PRN
  Administered 2016-04-28 – 2016-05-04 (×7): 5 mg via ORAL
  Filled 2016-04-28 (×7): qty 1

## 2016-04-28 MED ORDER — BISACODYL 10 MG RE SUPP
10.0000 mg | Freq: Once | RECTAL | Status: AC
  Administered 2016-04-28: 10 mg via RECTAL
  Filled 2016-04-28: qty 1

## 2016-04-28 NOTE — Progress Notes (Signed)
Dr. Sheliah HatchKinsinger, L notified of findings.  Pt refused oxygen. Dr. Sheliah HatchKinsinger states that, " It is anxiety related, he is not ordering anything."

## 2016-04-28 NOTE — Progress Notes (Signed)
Patient ID: Joseph [name], male   DOB: 03-Jun-1980, 36 y.o.   MRN: [id number]  [facility] Surgery Progress Note     Subjective: Patient admitted 2 days ago. Reports abdominal pain less than when he came to the ED, but feels about the same as yesterday. Tolerating clears. Denies emesis.  Objective: Vital signs in last 24 hours: Temp:  [98.1 F (36.7 C)-99.3 F (37.4 C)] 98.8 F (37.1 C) (03/12 0553) Pulse Rate:  [112-124] 117 (03/12 0553) Resp:  [20] 20 (03/12 0553) BP: (144-178)/(93-103) 178/93 (03/12 0553) SpO2:  [95 %-98 %] 95 % (03/12 0553) Last BM Date: 04/27/16  Intake/Output from previous day: 03/11 0701 - 03/12 0700 In: 2131.7 [P.O.:180; I.V.:1851.7; IV Piggyback:100] Out: -  Intake/Output this shift: No intake/output data recorded.  PE: Gen:  Alert, NAD, pleasant Card:  Regular rhythm, no M/G/R heard, mild tachycardia at 102 Pulm:  CTAB, no W/R/R, effort normal Abd: Soft, ND, +BS, no HSM, moderate global tenderness. No rebound or guarding. No peritonitis. Ext:  No erythema, edema, or tenderness   Lab Results:   Recent Labs  04/26/16 2214 04/28/16 0427  WBC 8.5 13.3*  HGB 14.5 14.4  HCT 42.3 42.7  PLT 270 261   BMET  Recent Labs  04/26/16 2214 04/28/16 0427  NA 137 137  K 3.6 3.6  CL 107 107  CO2 25 21*  GLUCOSE 219* 180*  BUN 16 9  CREATININE 0.72 0.78  CALCIUM 8.9 8.6*   PT/INR No results for input(s): LABPROT, INR in the last 72 hours. CMP     Component Value Date/Time   NA 137 04/28/2016 0427   K 3.6 04/28/2016 0427   CL 107 04/28/2016 0427   CO2 21 (L) 04/28/2016 0427   GLUCOSE 180 (H) 04/28/2016 0427   BUN 9 04/28/2016 0427   CREATININE 0.78 04/28/2016 0427   CREATININE 0.82 12/29/2015 1547   CALCIUM 8.6 (L) 04/28/2016 0427   PROT 7.4 04/26/2016 2214   ALBUMIN 4.0 04/26/2016 2214   AST 22 04/26/2016 2214   ALT 40 04/26/2016 2214   ALKPHOS 58 04/26/2016 2214   BILITOT 1.0 04/26/2016 2214   GFRNONAA >60 04/28/2016 0427   GFRNONAA >89 12/29/2015 1547   GFRAA >60 04/28/2016 0427   GFRAA >89 12/29/2015 1547   Lipase     Component Value Date/Time   LIPASE 27 04/26/2016 2214       Studies/Results: Ct Abdomen Pelvis W Contrast  Result Date: 04/27/2016 CLINICAL DATA:  Cramping in the lower abdomen. Patient reports history of appendectomy and upon 02/04/2016 with removal of infection. Patient states the pain started suddenly this evening with feelings of constipation. EXAM: CT ABDOMEN AND PELVIS WITH CONTRAST TECHNIQUE: Multidetector CT imaging of the abdomen and pelvis was performed using the standard protocol following bolus administration of intravenous contrast. CONTRAST:  ISOVUE-300 IOPAMIDOL (ISOVUE-300) INJECTION 61% COMPARISON:  None. FINDINGS: Lower chest: Small hiatal hernia. There is debris in the distal esophagus possibly from reflux. Atelectasis noted at each lung base bilaterally. Top normal-sized cardiac chambers. No pericardial effusion. Hepatobiliary: The liver is homogeneous in appearance. There is no biliary dilatation. The gallbladder is unremarkable. There is a small amount of subhepatic fluid adjacent to the right hepatic lobe extending along the right pericolic gutter. Pancreas: Unremarkable. No pancreatic ductal dilatation or surrounding inflammatory changes. Spleen: Normal in size without focal abnormality. Adrenals/Urinary Tract: Adrenal glands are unremarkable. Kidneys are normal, without renal calculi, focal lesion, or hydronephrosis. Bladder is unremarkable. Stomach/Bowel: There is a  tubular inflamed structure in the right lower quadrant measuring up to 14 mm in diameter with adjacent mesenteric fatty inflammation. Additionally there are 2 surgical clips just medial to this tubular structure which may represent sequela of prior reported appendectomy. This current finding may represent inflammation of an appendiceal remnant. The visualized stomach, small and large intestine are unremarkable  apart from a large volume of stool in the region of the cecum and scattered colonic diverticulosis along the sigmoid. Vascular/Lymphatic: No significant vascular findings are present. No enlarged abdominal or pelvic lymph nodes. A small reactive lymph node is seen in the right lower quadrant mesenteric measuring 6 mm short axis. Reproductive: Normal size prostate. Other: Small amount of free fluid in the pelvis and along the right pericolic gutter. Musculoskeletal: Nonacute IMPRESSION: 1. Inflamed, tubular blind-ending appearing structure in the right lower quadrant measuring up to 14 mm in diameter suspicious for appendicitis possibly of an appendiceal remnant given patient's history of appendectomy. This may actually represent the appendix itself as two surgical clips are seen medial to this structure that are also separate from the base of the cecum. Correlation with operative report and pathology be helpful. 2. Small amount of free fluid in the right quadrant, pelvis and right paracolic gutter. Electronically Signed   By: Tollie Ethavid  Kwon M.D.   On: 04/27/2016 00:49    Anti-infectives: Anti-infectives    Start     Dose/Rate Route Frequency Ordered Stop   04/27/16 0745  piperacillin-tazobactam (ZOSYN) IVPB 3.375 g     3.375 g 12.5 mL/hr over 240 Minutes Intravenous Every 8 hours 04/27/16 0731     04/27/16 0115  metroNIDAZOLE (FLAGYL) IVPB 500 mg     500 mg 100 mL/hr over 60 Minutes Intravenous  Once 04/27/16 0103 04/27/16 0424   04/27/16 0115  cefTRIAXone (ROCEPHIN) 2 g in dextrose 5 % 50 mL IVPB     2 g 100 mL/hr over 30 Minutes Intravenous  Once 04/27/16 0103 04/27/16 0305       Assessment/Plan Acute appendicitis - h/o partial appendectomy and drainage of abscess in [place] 02/04/16; never followed up for definitive appendectomy - CT scan 3/10 showed some appendicitis but no abscess or perforation  DM - SSI  ID - zosyn 3/11>> FEN - IVF, clears VTE - heparin  Plan - Continue IV  antibiotics and monitor. Recheck CBC in AM.   LOS: 1 day    Edson SnowballBROOKE A MILLER , [facility] 04/28/2016, 8:27 AM Pager: [phone] Consults: [phone] Mon-Fri 7:00 am-4:30 pm Sat-Sun 7:00 am-11:30 am

## 2016-04-29 LAB — CBC
HCT: 40.6 % (ref 39.0–52.0)
Hemoglobin: 13.7 g/dL (ref 13.0–17.0)
MCH: 29.3 pg (ref 26.0–34.0)
MCHC: 33.7 g/dL (ref 30.0–36.0)
MCV: 86.9 fL (ref 78.0–100.0)
Platelets: 246 10*3/uL (ref 150–400)
RBC: 4.67 MIL/uL (ref 4.22–5.81)
RDW: 13.4 % (ref 11.5–15.5)
WBC: 12.8 10*3/uL — AB (ref 4.0–10.5)

## 2016-04-29 LAB — GLUCOSE, CAPILLARY
Glucose-Capillary: 148 mg/dL — ABNORMAL HIGH (ref 65–99)
Glucose-Capillary: 174 mg/dL — ABNORMAL HIGH (ref 65–99)
Glucose-Capillary: 160 mg/dL — ABNORMAL HIGH (ref 65–99)
Glucose-Capillary: 165 mg/dL — ABNORMAL HIGH (ref 65–99)

## 2016-04-29 LAB — BASIC METABOLIC PANEL
Anion gap: 8 (ref 5–15)
BUN: 11 mg/dL (ref 6–20)
CALCIUM: 8.5 mg/dL — AB (ref 8.9–10.3)
CO2: 20 mmol/L — AB (ref 22–32)
Chloride: 108 mmol/L (ref 101–111)
Creatinine, Ser: 0.76 mg/dL (ref 0.61–1.24)
GFR calc Af Amer: >60 mL/min (ref >60)
GFR calc non Af Amer: >60 mL/min (ref >60)
GLUCOSE: 159 mg/dL — AB (ref 65–99)
Potassium: 3.7 mmol/L (ref 3.5–5.1)
Sodium: 136 mmol/L (ref 135–145)

## 2016-04-29 MED ORDER — MORPHINE SULFATE (PF) 4 MG/ML IV SOLN
1.0000 mg | INTRAVENOUS | Status: DC | PRN
  Administered 2016-04-29 – 2016-04-30 (×4): 4 mg via INTRAVENOUS
  Administered 2016-04-30: 2 mg via INTRAVENOUS
  Administered 2016-04-30 – 2016-05-01 (×6): 4 mg via INTRAVENOUS
  Administered 2016-05-01: 2 mg via INTRAVENOUS
  Administered 2016-05-01 – 2016-05-04 (×10): 4 mg via INTRAVENOUS
  Filled 2016-04-29 (×21): qty 1

## 2016-04-29 MED ORDER — DOCUSATE SODIUM 100 MG PO CAPS
100.0000 mg | ORAL_CAPSULE | Freq: Two times a day (BID) | ORAL | Status: DC
  Administered 2016-04-29 – 2016-05-05 (×11): 100 mg via ORAL
  Filled 2016-04-29 (×11): qty 1

## 2016-04-29 NOTE — Progress Notes (Addendum)
Patient requiring more pain medication to achieve moderate comfort. Patient's pain was previously controlled on 2mg  morphine, now needing 4mg  for pain level to go from 8/10 to 6/10. Abdomen distended and tender, but not taut. Patient states he doesn't think he can stand the pain for much longer, wants to speak with the surgeon about proceeding with appendectomy. Called Evangeline GulaBrooke Miller and conveyed the patient's message, was told that they would round again later and speak with him then.

## 2016-04-29 NOTE — Progress Notes (Signed)
Patient ID: Cheron EveryMichael Steinborn, male   DOB: [birth year], 36 y.o.   MRN: [id number]  [facility]     Subjective: Patient reports some improvement in his pain. Denies n/v. States that he feels like he did last time he had appendicitis when he was starting to heal. He is still requiring morphine.   Objective: Vital signs in last 24 hours: Temp:  [97 F (36.1 C)-99.8 F (37.7 C)] 97 F (36.1 C) (03/13 0531) Pulse Rate:  [115-137] 115 (03/13 0531) Resp:  [20-24] 24 (03/13 0531) BP: (137-166)/(81-111) 137/99 (03/13 0531) SpO2:  [95 %-99 %] 99 % (03/13 0531) Last BM Date: 04/26/16  Intake/Output from previous day: 03/12 0701 - 03/13 0700 In: 1350 [I.V.:1200; IV Piggyback:150] Out: -  Intake/Output this shift: No intake/output data recorded.  PE: Gen:  Alert, NAD, pleasant Card:  Regular rhythm, no M/G/R heard, mild tachycardia at 100 Pulm:  CTAB, no W/R/R, effort normal Abd: Soft, mild distension, +BS, no HSM, mild lower abdominal tenderness. No rebound or guarding. No peritonitis. Ext:  No erythema, edema, or tenderness   Lab Results:   Recent Labs  04/28/16 0427 04/29/16 0547  WBC 13.3* 12.8*  HGB 14.4 13.7  HCT 42.7 40.6  PLT 261 246   BMET  Recent Labs  04/28/16 0427 04/29/16 0547  NA 137 136  K 3.6 3.7  CL 107 108  CO2 21* 20*  GLUCOSE 180* 159*  BUN 9 11  CREATININE 0.78 0.76  CALCIUM 8.6* 8.5*   PT/INR No results for input(s): LABPROT, INR in the last 72 hours. CMP     Component Value Date/Time   NA 136 04/29/2016 0547   K 3.7 04/29/2016 0547   CL 108 04/29/2016 0547   CO2 20 (L) 04/29/2016 0547   GLUCOSE 159 (H) 04/29/2016 0547   BUN 11 04/29/2016 0547   CREATININE 0.76 04/29/2016 0547   CREATININE 0.82 12/29/2015 1547   CALCIUM 8.5 (L) 04/29/2016 0547   PROT 7.4 04/26/2016 2214   ALBUMIN 4.0 04/26/2016 2214   AST 22 04/26/2016 2214   ALT 40 04/26/2016 2214   ALKPHOS 58 04/26/2016 2214   BILITOT 1.0 04/26/2016 2214   GFRNONAA >60 04/29/2016 0547   GFRNONAA >89 12/29/2015 1547   GFRAA >60 04/29/2016 0547   GFRAA >89 12/29/2015 1547   Lipase     Component Value Date/Time   LIPASE 27 04/26/2016 2214       Studies/Results: No results found.  Anti-infectives: Anti-infectives    Start     Dose/Rate Route Frequency Ordered Stop   04/27/16 0745  piperacillin-tazobactam (ZOSYN) IVPB 3.375 g     3.375 g 12.5 mL/hr over 240 Minutes Intravenous Every 8 hours 04/27/16 0731     04/27/16 0115  metroNIDAZOLE (FLAGYL) IVPB 500 mg     500 mg 100 mL/hr over 60 Minutes Intravenous  Once 04/27/16 0103 04/27/16 0424   04/27/16 0115  cefTRIAXone (ROCEPHIN) 2 g in dextrose 5 % 50 mL IVPB     2 g 100 mL/hr over 30 Minutes Intravenous  Once 04/27/16 0103 04/27/16 0305       Assessment/Plan Acute appendicitis - h/o partial appendectomy and drainage of abscess in [place] 02/04/16; never followed up for definitive appendectomy - CT scan 3/10 showed some appendicitis but no abscess or perforation  DM - SSI  ID - zosyn 3/11>> FEN - IVF, clears VTE - heparin  Plan - WBC slightly down and patient with some improvement in his pain.  Will continue IV antibiotics and monitor. Recheck CBC in AM. Encourage ambulation.    LOS: 2 days    Edson Snowball , [facility] 04/29/2016, 9:22 AM Pager: [phone] Consults: [phone] Mon-Fri 7:00 am-4:30 pm Sat-Sun 7:00 am-11:30 am

## 2016-04-30 LAB — CBC
HCT: 37.7 % — ABNORMAL LOW (ref 39.0–52.0)
Hemoglobin: 12.8 g/dL — ABNORMAL LOW (ref 13.0–17.0)
MCH: 29.1 pg (ref 26.0–34.0)
MCHC: 34 g/dL (ref 30.0–36.0)
MCV: 85.7 fL (ref 78.0–100.0)
PLATELETS: 269 10*3/uL (ref 150–400)
RBC: 4.4 MIL/uL (ref 4.22–5.81)
RDW: 13.1 % (ref 11.5–15.5)
WBC: 12.3 10*3/uL — ABNORMAL HIGH (ref 4.0–10.5)

## 2016-04-30 LAB — GLUCOSE, CAPILLARY
GLUCOSE-CAPILLARY: 164 mg/dL — AB (ref 65–99)
Glucose-Capillary: 150 mg/dL — ABNORMAL HIGH (ref 65–99)
Glucose-Capillary: 143 mg/dL — ABNORMAL HIGH (ref 65–99)
Glucose-Capillary: 140 mg/dL — ABNORMAL HIGH (ref 65–99)

## 2016-04-30 LAB — BASIC METABOLIC PANEL
Anion gap: 8 (ref 5–15)
BUN: 11 mg/dL (ref 6–20)
CALCIUM: 8.1 mg/dL — AB (ref 8.9–10.3)
CO2: 20 mmol/L — AB (ref 22–32)
Chloride: 107 mmol/L (ref 101–111)
Creatinine, Ser: 0.76 mg/dL (ref 0.61–1.24)
GFR calc Af Amer: >60 mL/min (ref >60)
GLUCOSE: 164 mg/dL — AB (ref 65–99)
Potassium: 3.7 mmol/L (ref 3.5–5.1)
Sodium: 135 mmol/L (ref 135–145)

## 2016-04-30 MED ORDER — IOPAMIDOL (ISOVUE-300) INJECTION 61%: 15.0000 mL | Freq: Two times a day (BID) | INTRAVENOUS | Status: DC | PRN

## 2016-04-30 MED ORDER — IOPAMIDOL (ISOVUE-300) INJECTION 61%
INTRAVENOUS | Status: AC
Start: 2016-04-30 — End: 2016-04-30
  Filled 2016-04-30: qty 30

## 2016-04-30 MED ORDER — POTASSIUM CHLORIDE IN NACL 20-0.45 MEQ/L-% IV SOLN
INTRAVENOUS | Status: DC
  Administered 2016-04-30 (×2): via INTRAVENOUS
  Administered 2016-05-01: 1000 mL via INTRAVENOUS
  Administered 2016-05-01 – 2016-05-02 (×2): via INTRAVENOUS
  Administered 2016-05-02: 1000 mL via INTRAVENOUS
  Administered 2016-05-04 (×2): via INTRAVENOUS
  Filled 2016-04-30 (×12): qty 1000

## 2016-04-30 NOTE — Progress Notes (Signed)
  General Surgery [facility]  Assessment & Plan:  HD#5 - Acute appendicitis  h/o partial appendectomy and drainage of abscess in [place] 02/04/16  IV Zosyn  WBC slowly improving - 12.3 today  Plan repeat CT abd tomorrow 3/15 AM  Allow full liquid diet today DM  SSI  Patient is clinically improved this AM.  Abdomen softer, less tender.  Encouraged OOB, ambulation.  Will repeat CT abd in AM and compare to 3/10 study.  Decision at that point regarding continued IV abx, po abx, or surgery.        Velora Hecklerodd M. Yoniel Arkwright, MD, [facility]       [facility]       Office: [phone]    Subjective: Patient up in room, pain somewhat improved.  Wants to drink liquids.  Limited ambulation.  Objective: Vital signs in last 24 hours: Temp:  [97.8 F (36.6 C)-99.7 F (37.6 C)] 99.3 F (37.4 C) (03/14 0506) Pulse Rate:  [112-129] 112 (03/14 0506) Resp:  [18-20] 20 (03/14 0506) BP: (153-167)/(88-94) 153/92 (03/14 0506) SpO2:  [95 %-97 %] 97 % (03/14 0506) Last BM Date: 04/28/16  Intake/Output from previous day: 03/13 0701 - 03/14 0700 In: 2910 [P.O.:600; I.V.:2100; IV Piggyback:210] Out: 325 [Urine:325] Intake/Output this shift: No intake/output data recorded.  Physical Exam: HEENT - sclerae clear, mucous membranes moist Neck - soft Abdomen - softer, less distended; mild diffuse tenderness, no mass, less guarding Ext - no edema, non-tender Neuro - alert & oriented, no focal deficits  Lab Results:   Recent Labs  04/29/16 0547 04/30/16 0531  WBC 12.8* 12.3*  HGB 13.7 12.8*  HCT 40.6 37.7*  PLT 246 269   BMET  Recent Labs  04/29/16 0547 04/30/16 0531  NA 136 135  K 3.7 3.7  CL 108 107  CO2 20* 20*  GLUCOSE 159* 164*  BUN 11 11  CREATININE 0.76 0.76  CALCIUM 8.5* 8.1*   PT/INR No results for input(s): LABPROT, INR in the last 72 hours. Comprehensive Metabolic Panel:    Component Value Date/Time   NA 135 04/30/2016 0531   NA  136 04/29/2016 0547   K 3.7 04/30/2016 0531   K 3.7 04/29/2016 0547   CL 107 04/30/2016 0531   CL 108 04/29/2016 0547   CO2 20 (L) 04/30/2016 0531   CO2 20 (L) 04/29/2016 0547   BUN 11 04/30/2016 0531   BUN 11 04/29/2016 0547   CREATININE 0.76 04/30/2016 0531   CREATININE 0.76 04/29/2016 0547   CREATININE 0.82 12/29/2015 1547   GLUCOSE 164 (H) 04/30/2016 0531   GLUCOSE 159 (H) 04/29/2016 0547   CALCIUM 8.1 (L) 04/30/2016 0531   CALCIUM 8.5 (L) 04/29/2016 0547   AST 22 04/26/2016 2214   AST 19 12/29/2015 1547   ALT 40 04/26/2016 2214   ALT 30 12/29/2015 1547   ALKPHOS 58 04/26/2016 2214   ALKPHOS 69 12/29/2015 1547   BILITOT 1.0 04/26/2016 2214   BILITOT 0.5 12/29/2015 1547   PROT 7.4 04/26/2016 2214   PROT 7.7 12/29/2015 1547   ALBUMIN 4.0 04/26/2016 2214   ALBUMIN 4.3 12/29/2015 1547    Studies/Results: No results found.    Gabryella Murfin Judie PetitM 04/30/2016

## 2016-05-01 ENCOUNTER — Inpatient Hospital Stay (HOSPITAL_COMMUNITY): Payer: BLUE CROSS/BLUE SHIELD

## 2016-05-01 ENCOUNTER — Encounter (HOSPITAL_COMMUNITY): Payer: Self-pay | Admitting: Radiology

## 2016-05-01 LAB — CBC
HCT: 40.6 % (ref 39.0–52.0)
Hemoglobin: 13.7 g/dL (ref 13.0–17.0)
MCH: 29.1 pg (ref 26.0–34.0)
MCHC: 33.7 g/dL (ref 30.0–36.0)
MCV: 86.4 fL (ref 78.0–100.0)
PLATELETS: 293 10*3/uL (ref 150–400)
RBC: 4.7 MIL/uL (ref 4.22–5.81)
RDW: 13.1 % (ref 11.5–15.5)
WBC: 11.1 10*3/uL — AB (ref 4.0–10.5)

## 2016-05-01 LAB — GLUCOSE, CAPILLARY
GLUCOSE-CAPILLARY: 149 mg/dL — AB (ref 65–99)
GLUCOSE-CAPILLARY: 132 mg/dL — AB (ref 65–99)
Glucose-Capillary: 149 mg/dL — ABNORMAL HIGH (ref 65–99)
Glucose-Capillary: 154 mg/dL — ABNORMAL HIGH (ref 65–99)

## 2016-05-01 MED ORDER — SODIUM CHLORIDE 0.9 % IV BOLUS (SEPSIS)
1000.0000 mL | Freq: Once | INTRAVENOUS | Status: AC
  Administered 2016-05-01: 1000 mL via INTRAVENOUS

## 2016-05-01 MED ORDER — HYDRALAZINE HCL 20 MG/ML IJ SOLN
5.0000 mg | Freq: Four times a day (QID) | INTRAMUSCULAR | Status: DC | PRN
  Administered 2016-05-01 – 2016-05-02 (×5): 5 mg via INTRAVENOUS
  Filled 2016-05-01 (×5): qty 1

## 2016-05-01 MED ORDER — IOPAMIDOL (ISOVUE-300) INJECTION 61%
15.0000 mL | Freq: Two times a day (BID) | INTRAVENOUS | Status: AC | PRN
Start: 2016-05-01 — End: 2016-05-01
  Administered 2016-05-01: 15 mL via ORAL

## 2016-05-01 MED ORDER — IOPAMIDOL (ISOVUE-300) INJECTION 61%
INTRAVENOUS | Status: AC
  Administered 2016-05-01: 100 mL
  Filled 2016-05-01: qty 100

## 2016-05-01 NOTE — Progress Notes (Signed)
Patient ID: Joseph [name], male   DOB: 03-20-1980, 36 y.o.   MRN: [id number]  [facility] Surgery Progress Note     Subjective: No better today. Complaining of significant global abdominal pain. CT scheduled for this morning.  Objective: Vital signs in last 24 hours: Temp:  [98.9 F (37.2 C)-99.2 F (37.3 C)] 99.1 F (37.3 C) (03/15 0449) Pulse Rate:  [106-108] 106 (03/15 0449) Resp:  [18-20] 18 (03/15 0449) BP: (153-168)/(93-105) 159/97 (03/15 0449) SpO2:  [97 %-99 %] 98 % (03/15 0449) Last BM Date: 04/28/16  Intake/Output from previous day: 03/14 0701 - 03/15 0700 In: 2476.3 [P.O.:240; I.V.:1876.3; IV Piggyback:360] Out: 600 [Urine:600] Intake/Output this shift: No intake/output data recorded.  PE: Gen: Alert, NAD, pleasant Card: Regular rhythm, no M/G/R heard, mild tachycardia at 100 Pulm: CTAB, no W/R/R, effort normal Abd: Soft, moderate distension, +BS, no HSM, moderate global tenderness with voluntary guarding. No rebound. No peritonitis. Ext: No erythema, edema, or tenderness   Lab Results:   Recent Labs  04/30/16 0531 05/01/16 0515  WBC 12.3* 11.1*  HGB 12.8* 13.7  HCT 37.7* 40.6  PLT 269 293   BMET  Recent Labs  04/29/16 0547 04/30/16 0531  NA 136 135  K 3.7 3.7  CL 108 107  CO2 20* 20*  GLUCOSE 159* 164*  BUN 11 11  CREATININE 0.76 0.76  CALCIUM 8.5* 8.1*   PT/INR No results for input(s): LABPROT, INR in the last 72 hours. CMP     Component Value Date/Time   NA 135 04/30/2016 0531   K 3.7 04/30/2016 0531   CL 107 04/30/2016 0531   CO2 20 (L) 04/30/2016 0531   GLUCOSE 164 (H) 04/30/2016 0531   BUN 11 04/30/2016 0531   CREATININE 0.76 04/30/2016 0531   CREATININE 0.82 12/29/2015 1547   CALCIUM 8.1 (L) 04/30/2016 0531   PROT 7.4 04/26/2016 2214   ALBUMIN 4.0 04/26/2016 2214   AST 22 04/26/2016 2214   ALT 40 04/26/2016 2214   ALKPHOS 58 04/26/2016 2214   BILITOT 1.0 04/26/2016 2214   GFRNONAA >60 04/30/2016 0531   GFRNONAA >89 12/29/2015 1547   GFRAA >60 04/30/2016 0531   GFRAA >89 12/29/2015 1547   Lipase     Component Value Date/Time   LIPASE 27 04/26/2016 2214       Studies/Results: No results found.  Anti-infectives: Anti-infectives    Start     Dose/Rate Route Frequency Ordered Stop   04/27/16 0745  piperacillin-tazobactam (ZOSYN) IVPB 3.375 g     3.375 g 12.5 mL/hr over 240 Minutes Intravenous Every 8 hours 04/27/16 0731     04/27/16 0115  metroNIDAZOLE (FLAGYL) IVPB 500 mg     500 mg 100 mL/hr over 60 Minutes Intravenous  Once 04/27/16 0103 04/27/16 0424   04/27/16 0115  cefTRIAXone (ROCEPHIN) 2 g in dextrose 5 % 50 mL IVPB     2 g 100 mL/hr over 30 Minutes Intravenous  Once 04/27/16 0103 04/27/16 0305       Assessment/Plan Acute appendicitis - h/o partial appendectomy and drainage of abscess in [place] 02/04/16; never followed up for definitive appendectomy - CT scan 3/10 showed some appendicitis but no abscess or perforation - WBC slightly down at 11.1  DM - SSI  ID - zosyn 3/11>> FEN - IVF, NPO VTE - heparin  Plan - Repeat CT scan this morning. Patient significantly tender and more distended today. Will make NPO in case he needs to go to OR. Continue IV antibiotics. Hold  heparin.   LOS: 4 days    Edson SnowballBROOKE A MILLER , [facility] 05/01/2016, 7:32 AM Pager: [phone] Consults: [phone] Mon-Fri 7:00 am-4:30 pm Sat-Sun 7:00 am-11:30 am

## 2016-05-01 NOTE — Progress Notes (Signed)
Inpatient Diabetes Program Recommendations  AACE/ADA: New Consensus Statement on Inpatient Glycemic Control (2015)  Target Ranges:  Prepandial:   less than 140 mg/dL      Peak postprandial:   less than 180 mg/dL (1-2 hours)      Critically ill patients:  140 - 180 mg/dL   Lab Results  Component Value Date   GLUCAP 149 (H) 05/01/2016   HGBA1C 10.3 (H) 04/27/2016    Review of Glycemic Control  Diabetes history: DM1 Outpatient Diabetes medications: Basaglar 110 units QHS Current orders for Inpatient glycemic control: Novolog 0-9 units tidwc  Works 3rd shift, takes insulin QD. Per endo note, misses his insulin approximately 1x/week. Does not check blood sugars regularly.  NPO today in case he needs to go to OR.  Inpatient Diabetes Program Recommendations:    Change Novolog to 0-9 units Q4H. Will need insulin adjustment prior to discharge with HgbA1C of 10.3%.  Will continue to follow. Thank you. Ailene Ardshonda Darionna Banke, RD, LDN, CDE Inpatient Diabetes Coordinator [phone]

## 2016-05-02 ENCOUNTER — Encounter (HOSPITAL_COMMUNITY): Payer: Self-pay | Admitting: General Surgery

## 2016-05-02 ENCOUNTER — Inpatient Hospital Stay (HOSPITAL_COMMUNITY): Payer: BLUE CROSS/BLUE SHIELD

## 2016-05-02 LAB — BASIC METABOLIC PANEL
Anion gap: 9 (ref 5–15)
BUN: 9 mg/dL (ref 6–20)
CALCIUM: 8.3 mg/dL — AB (ref 8.9–10.3)
CO2: 21 mmol/L — ABNORMAL LOW (ref 22–32)
CREATININE: 0.7 mg/dL (ref 0.61–1.24)
Chloride: 107 mmol/L (ref 101–111)
GFR calc non Af Amer: >60 mL/min (ref >60)
Glucose, Bld: 153 mg/dL — ABNORMAL HIGH (ref 65–99)
Potassium: 3.5 mmol/L (ref 3.5–5.1)
SODIUM: 137 mmol/L (ref 135–145)

## 2016-05-02 LAB — PROTIME-INR
INR: 1
Prothrombin Time: 13.2 seconds (ref 11.4–15.2)

## 2016-05-02 LAB — GLUCOSE, CAPILLARY
GLUCOSE-CAPILLARY: 152 mg/dL — AB (ref 65–99)
GLUCOSE-CAPILLARY: 112 mg/dL — AB (ref 65–99)
Glucose-Capillary: 144 mg/dL — ABNORMAL HIGH (ref 65–99)
Glucose-Capillary: 155 mg/dL — ABNORMAL HIGH (ref 65–99)

## 2016-05-02 LAB — CBC
HCT: 36.8 % — ABNORMAL LOW (ref 39.0–52.0)
Hemoglobin: 12.5 g/dL — ABNORMAL LOW (ref 13.0–17.0)
MCH: 29.3 pg (ref 26.0–34.0)
MCHC: 34 g/dL (ref 30.0–36.0)
MCV: 86.2 fL (ref 78.0–100.0)
Platelets: 306 10*3/uL (ref 150–400)
RBC: 4.27 MIL/uL (ref 4.22–5.81)
RDW: 13.2 % (ref 11.5–15.5)
WBC: 12.4 10*3/uL — AB (ref 4.0–10.5)

## 2016-05-02 MED ORDER — MIDAZOLAM HCL 2 MG/2ML IJ SOLN
INTRAMUSCULAR | Status: AC
  Filled 2016-05-02: qty 6

## 2016-05-02 MED ORDER — FENTANYL CITRATE (PF) 100 MCG/2ML IJ SOLN
INTRAMUSCULAR | Status: AC | PRN
  Administered 2016-05-02: 25 ug via INTRAVENOUS
  Administered 2016-05-02 (×2): 50 ug via INTRAVENOUS

## 2016-05-02 MED ORDER — NALOXONE HCL 0.4 MG/ML IJ SOLN
INTRAMUSCULAR | Status: AC
  Filled 2016-05-02: qty 1

## 2016-05-02 MED ORDER — FENTANYL CITRATE (PF) 100 MCG/2ML IJ SOLN
INTRAMUSCULAR | Status: AC
  Filled 2016-05-02: qty 6

## 2016-05-02 MED ORDER — LABETALOL HCL 5 MG/ML IV SOLN
5.0000 mg | INTRAVENOUS | Status: DC | PRN
  Administered 2016-05-02 (×2): 5 mg via INTRAVENOUS
  Filled 2016-05-02 (×5): qty 4

## 2016-05-02 MED ORDER — PREMIER PROTEIN SHAKE
11.0000 [oz_av] | Freq: Two times a day (BID) | ORAL | Status: DC
Start: 2016-05-02 — End: 2016-05-05
  Administered 2016-05-03: 11 [oz_av] via ORAL

## 2016-05-02 MED ORDER — FLUMAZENIL 0.5 MG/5ML IV SOLN
INTRAVENOUS | Status: AC
  Filled 2016-05-02: qty 5

## 2016-05-02 MED ORDER — MIDAZOLAM HCL 2 MG/2ML IJ SOLN
INTRAMUSCULAR | Status: AC | PRN
  Administered 2016-05-02: 1.5 mg via INTRAVENOUS
  Administered 2016-05-02: 1 mg via INTRAVENOUS
  Administered 2016-05-02: 0.5 mg via INTRAVENOUS

## 2016-05-02 MED ORDER — HEPARIN SODIUM (PORCINE) 5000 UNIT/ML IJ SOLN
5000.0000 [IU] | Freq: Three times a day (TID) | INTRAMUSCULAR | Status: DC
  Administered 2016-05-03 – 2016-05-05 (×6): 5000 [IU] via SUBCUTANEOUS
  Filled 2016-05-02 (×6): qty 1

## 2016-05-02 NOTE — Consult Note (Signed)
Chief Complaint: pelvic abscess   Referring Physician:Dr. Armandina Gemma  Supervising Physician: Daryll Brod  Patient Status: [facility] - In-pt  HPI: Joseph [name] is an 36 y.o. male who underwent a partial appendectomy on 02/04/16 in [place].  He was likely perforated at this time as they drained an abscess as well.  He was supposed to follow up for a definitive completion appendectomy, but never did.  He began to develop diffuse lower abdominal pain last Saturday about 30 minutes after having a BM. This was similar to the pain he felt in December and decided to come straight to the ED.  Upon arrival, he was noted to have some appendicitis of his appendiceal stump, but no abscess was noted.  He was placed on abx therapy, but still continues to complain of abdominal pain.  He had a repeat CT scan yesterday which now reveals a pelvic abscess.  We have been asked to drain this collection.   Past Medical History:  Past Medical History:  Diagnosis Date  . Diabetes mellitus without complication ([place])   . Hyperlipidemia     Past Surgical History:  Past Surgical History:  Procedure Laterality Date  . APPENDECTOMY    . wisdom teeth pulled      Family History:  Family History  Problem Relation Age of Onset  . Diabetes Father   . Hypertension Maternal Grandmother   . Cancer Neg Hx   . Early death Neg Hx   . Heart disease Neg Hx   . Hyperlipidemia Neg Hx   . Kidney disease Neg Hx   . Learning disabilities Neg Hx   . Stroke Neg Hx     Social History:  reports that he has been smoking.  He uses smokeless tobacco. He reports that he does not drink alcohol or use drugs.  Allergies:  Allergies  Allergen Reactions  . Bee Venom Anaphylaxis    Medications: Medications have been reviewed  Please HPI for pertinent positives, otherwise complete 10 system ROS negative.  Mallampati Score: MD Evaluation Airway: WNL Heart: WNL Abdomen: WNL Chest/ Lungs: WNL ASA  Classification:  2 Mallampati/Airway Score: Two  Physical Exam: BP (!) 164/86 (BP Location: Right Arm)   Pulse (!) 117   Temp 98.5 F (36.9 C) (Oral)   Resp 20   Ht 6' (1.829 m)   Wt 252 lb 6.4 oz (114.5 kg)   SpO2 96%   BMI 34.23 kg/m  Body mass index is 34.23 kg/m. General: pleasant, WD, WN black male who is laying in bed in NAD HEENT: head is normocephalic, atraumatic.  Sclera are noninjected.  PERRL.  Ears and nose without any masses or lesions.  Mouth is pink and moist Heart: regular rhythm, but tachcardic.  Normal s1,s2. No obvious murmurs, gallops, or rubs noted.  Palpable radial pulses bilaterally Lungs: CTAB, no wheezes, rhonchi, or rales noted.  Respiratory effort nonlabored Abd: soft, diffusely tender, mild distention,  Hypoactive BS, no masses, hernias, or organomegaly Psych: A&Ox3 with an appropriate affect.   Labs: Results for orders placed or performed during the hospital encounter of 04/26/16 (from the past 48 hour(s))  Glucose, capillary     Status: Abnormal   Collection Time: 04/30/16 11:58 AM  Result Value Ref Range   Glucose-Capillary 143 (H) 65 - 99 mg/dL  Glucose, capillary     Status: Abnormal   Collection Time: 04/30/16  4:39 PM  Result Value Ref Range   Glucose-Capillary 164 (H) 65 - 99 mg/dL  Glucose,  capillary     Status: Abnormal   Collection Time: 04/30/16  9:54 PM  Result Value Ref Range   Glucose-Capillary 140 (H) 65 - 99 mg/dL  CBC     Status: Abnormal   Collection Time: 05/01/16  5:15 AM  Result Value Ref Range   WBC 11.1 (H) 4.0 - 10.5 K/uL   RBC 4.70 4.22 - 5.81 MIL/uL   Hemoglobin 13.7 13.0 - 17.0 g/dL   HCT 40.6 39.0 - 52.0 %   MCV 86.4 78.0 - 100.0 fL   MCH 29.1 26.0 - 34.0 pg   MCHC 33.7 30.0 - 36.0 g/dL   RDW 13.1 11.5 - 15.5 %   Platelets 293 150 - 400 K/uL  Glucose, capillary     Status: Abnormal   Collection Time: 05/01/16  7:31 AM  Result Value Ref Range   Glucose-Capillary 149 (H) 65 - 99 mg/dL  Glucose, capillary     Status: Abnormal    Collection Time: 05/01/16 12:04 PM  Result Value Ref Range   Glucose-Capillary 149 (H) 65 - 99 mg/dL  Glucose, capillary     Status: Abnormal   Collection Time: 05/01/16  5:11 PM  Result Value Ref Range   Glucose-Capillary 132 (H) 65 - 99 mg/dL  Glucose, capillary     Status: Abnormal   Collection Time: 05/01/16  9:48 PM  Result Value Ref Range   Glucose-Capillary 154 (H) 65 - 99 mg/dL  CBC     Status: Abnormal   Collection Time: 05/02/16  4:49 AM  Result Value Ref Range   WBC 12.4 (H) 4.0 - 10.5 K/uL   RBC 4.27 4.22 - 5.81 MIL/uL   Hemoglobin 12.5 (L) 13.0 - 17.0 g/dL   HCT 36.8 (L) 39.0 - 52.0 %   MCV 86.2 78.0 - 100.0 fL   MCH 29.3 26.0 - 34.0 pg   MCHC 34.0 30.0 - 36.0 g/dL   RDW 13.2 11.5 - 15.5 %   Platelets 306 150 - 400 K/uL  Basic metabolic panel     Status: Abnormal   Collection Time: 05/02/16  4:49 AM  Result Value Ref Range   Sodium 137 135 - 145 mmol/L   Potassium 3.5 3.5 - 5.1 mmol/L   Chloride 107 101 - 111 mmol/L   CO2 21 (L) 22 - 32 mmol/L   Glucose, Bld 153 (H) 65 - 99 mg/dL   BUN 9 6 - 20 mg/dL   Creatinine, Ser 0.70 0.61 - 1.24 mg/dL   Calcium 8.3 (L) 8.9 - 10.3 mg/dL   GFR calc non Af Amer >60 >60 mL/min   GFR calc Af Amer >60 >60 mL/min    Comment: (NOTE) The eGFR has been calculated using the CKD EPI equation. This calculation has not been validated in all clinical situations. eGFR's persistently <60 mL/min signify possible Chronic Kidney Disease.    Anion gap 9 5 - 15  Glucose, capillary     Status: Abnormal   Collection Time: 05/02/16  8:41 AM  Result Value Ref Range   Glucose-Capillary 152 (H) 65 - 99 mg/dL  Protime-INR     Status: None   Collection Time: 05/02/16 10:18 AM  Result Value Ref Range   Prothrombin Time 13.2 11.4 - 15.2 seconds   INR 1.00     Imaging: Ct Abdomen Pelvis W Contrast  Result Date: 05/01/2016 CLINICAL DATA:  History of partial appendectomy and abscess drainage in [place] on 02/04/2016. Patient ever had  definitive appendectomy. Now with pain and leukocytosis.  EXAM: CT ABDOMEN AND PELVIS WITH CONTRAST TECHNIQUE: Multidetector CT imaging of the abdomen and pelvis was performed using the standard protocol following bolus administration of intravenous contrast. CONTRAST:  1 ISOVUE-300 IOPAMIDOL (ISOVUE-300) INJECTION 61% COMPARISON:  04/26/2016 FINDINGS: Lower chest: Dependent subsegmental atelectasis noted both lower lobes. Small bilateral pleural effusions are evident. Hepatobiliary: No focal abnormality within the liver parenchyma. There is no evidence for gallstones, gallbladder wall thickening, or pericholecystic fluid. No intrahepatic or extrahepatic biliary dilation. Pancreas: No focal mass lesion. No dilatation of the main duct. No intraparenchymal cyst. No peripancreatic edema. Spleen: No splenomegaly. No focal mass lesion. Adrenals/Urinary Tract: No adrenal nodule or mass. Kidneys are unremarkable. No evidence for hydroureter. The urinary bladder appears normal for the degree of distention. Stomach/Bowel: Stomach is nondistended. No gastric wall thickening. No evidence of outlet obstruction. Duodenum is normally positioned as is the ligament of Treitz. Small bowel loops are mildly distended, measuring up to about 2.7 cm diameter. Distal small bowel loops and terminal ileum appear decompressed. Irregular and ill-defined appendiceal stump is again identified measuring 14 mm diameter. The inflammation in the right lower quadrant and central pelvis has progressed in the interval. There is more interloop mesenteric fluid. 2.0 x 8.2 cm mesenteric rim enhancing fluid collection is suspicious for evolving abscess. 8.8 x 5.5 x 6.3 cm rim enhancing irregular fluid collection is identified in the central pelvis, just cranial to the bladder, also suspicious for evolving abscess. Vascular/Lymphatic: No abdominal aortic aneurysm. No abdominal aortic atherosclerotic calcification. Small mesenteric lymph nodes are evident.  Reproductive: Prostate gland unremarkable. Other: Small volume free fluid. Musculoskeletal: Bone windows reveal no worrisome lytic or sclerotic osseous lesions. IMPRESSION: 1. Interval development of mesenteric and central pelvic rim enhancing fluid collections, suspicious for evolving multifocal abscess. 2. Interval development of proximal and mid small bowel distention suggesting inflammatory ileus . Electronically Signed   By: Misty Stanley M.D.   On: 05/01/2016 14:45    Assessment/Plan 1. Stump appendicitis with pelvic abscess -we will plan to proceed with drain placement of this fluid collection in the pelvis.  We will try to proceed today if timing/scheduling allows.  If not, then we will move forward tomorrow.  -cont NPO and holding heparin for now -PT/INR is normal -Risks and Benefits discussed with the patient including bleeding, infection, damage to adjacent structures, bowel perforation/fistula connection, and sepsis. All of the patient's questions were answered, patient is agreeable to proceed. Consent signed and in chart.  Thank you for this interesting consult.  I greatly enjoyed meeting Joseph [name] and look forward to participating in their care.  A copy of this report was sent to the requesting provider on this date.  Electronically Signed: Henreitta Cea 05/02/2016, 11:17 AM   I spent a total of 40 Minutes    in face to face in clinical consultation, greater than 50% of which was counseling/coordinating care for pelvic abscess

## 2016-05-02 NOTE — Progress Notes (Signed)
Patient ID: Joseph [name], male   DOB: December 07, 1980, 36 y.o.   MRN: [id number]  [facility] Surgery Progress Note     Subjective: No change. Continues to complain of severe, diffuse, and constant abdominal pain. He did have a BM yesterday.   Objective: Vital signs in last 24 hours: Temp:  [98.5 F (36.9 C)-99.7 F (37.6 C)] 98.5 F (36.9 C) (03/16 0455) Pulse Rate:  [115-123] 117 (03/16 0600) Resp:  [18-20] 20 (03/16 0455) BP: (159-181)/(86-115) 164/86 (03/16 0600) SpO2:  [96 %-100 %] 96 % (03/16 0455) Last BM Date: 04/29/16  Intake/Output from previous day: 03/15 0701 - 03/16 0700 In: 2731.4 [P.O.:121; I.V.:2460.4; IV Piggyback:150] Out: 1925 [Urine:1925] Intake/Output this shift: No intake/output data recorded.  PE: Gen: Alert, NAD, cooperative Pulm: effort normal Abd: Soft, mild distension, +BS, no HSM, moderate global tenderness with voluntary guarding. No rebound. No peritonitis. Ext: No erythema, edema, or tenderness  Lab Results:   Recent Labs  05/01/16 0515 05/02/16 0449  WBC 11.1* 12.4*  HGB 13.7 12.5*  HCT 40.6 36.8*  PLT 293 306   BMET  Recent Labs  04/30/16 0531 05/02/16 0449  NA 135 137  K 3.7 3.5  CL 107 107  CO2 20* 21*  GLUCOSE 164* 153*  BUN 11 9  CREATININE 0.76 0.70  CALCIUM 8.1* 8.3*   PT/INR No results for input(s): LABPROT, INR in the last 72 hours. CMP     Component Value Date/Time   NA 137 05/02/2016 0449   K 3.5 05/02/2016 0449   CL 107 05/02/2016 0449   CO2 21 (L) 05/02/2016 0449   GLUCOSE 153 (H) 05/02/2016 0449   BUN 9 05/02/2016 0449   CREATININE 0.70 05/02/2016 0449   CREATININE 0.82 12/29/2015 1547   CALCIUM 8.3 (L) 05/02/2016 0449   PROT 7.4 04/26/2016 2214   ALBUMIN 4.0 04/26/2016 2214   AST 22 04/26/2016 2214   ALT 40 04/26/2016 2214   ALKPHOS 58 04/26/2016 2214   BILITOT 1.0 04/26/2016 2214   GFRNONAA >60 05/02/2016 0449   GFRNONAA >89 12/29/2015 1547   GFRAA >60 05/02/2016 0449   GFRAA >89  12/29/2015 1547   Lipase     Component Value Date/Time   LIPASE 27 04/26/2016 2214       Studies/Results: Ct Abdomen Pelvis W Contrast  Result Date: 05/01/2016 CLINICAL DATA:  History of partial appendectomy and abscess drainage in [place] on 02/04/2016. Patient ever had definitive appendectomy. Now with pain and leukocytosis. EXAM: CT ABDOMEN AND PELVIS WITH CONTRAST TECHNIQUE: Multidetector CT imaging of the abdomen and pelvis was performed using the standard protocol following bolus administration of intravenous contrast. CONTRAST:  1 ISOVUE-300 IOPAMIDOL (ISOVUE-300) INJECTION 61% COMPARISON:  04/26/2016 FINDINGS: Lower chest: Dependent subsegmental atelectasis noted both lower lobes. Small bilateral pleural effusions are evident. Hepatobiliary: No focal abnormality within the liver parenchyma. There is no evidence for gallstones, gallbladder wall thickening, or pericholecystic fluid. No intrahepatic or extrahepatic biliary dilation. Pancreas: No focal mass lesion. No dilatation of the main duct. No intraparenchymal cyst. No peripancreatic edema. Spleen: No splenomegaly. No focal mass lesion. Adrenals/Urinary Tract: No adrenal nodule or mass. Kidneys are unremarkable. No evidence for hydroureter. The urinary bladder appears normal for the degree of distention. Stomach/Bowel: Stomach is nondistended. No gastric wall thickening. No evidence of outlet obstruction. Duodenum is normally positioned as is the ligament of Treitz. Small bowel loops are mildly distended, measuring up to about 2.7 cm diameter. Distal small bowel loops and terminal ileum appear decompressed. Irregular and  ill-defined appendiceal stump is again identified measuring 14 mm diameter. The inflammation in the right lower quadrant and central pelvis has progressed in the interval. There is more interloop mesenteric fluid. 2.0 x 8.2 cm mesenteric rim enhancing fluid collection is suspicious for evolving abscess. 8.8 x 5.5 x 6.3 cm rim  enhancing irregular fluid collection is identified in the central pelvis, just cranial to the bladder, also suspicious for evolving abscess. Vascular/Lymphatic: No abdominal aortic aneurysm. No abdominal aortic atherosclerotic calcification. Small mesenteric lymph nodes are evident. Reproductive: Prostate gland unremarkable. Other: Small volume free fluid. Musculoskeletal: Bone windows reveal no worrisome lytic or sclerotic osseous lesions. IMPRESSION: 1. Interval development of mesenteric and central pelvic rim enhancing fluid collections, suspicious for evolving multifocal abscess. 2. Interval development of proximal and mid small bowel distention suggesting inflammatory ileus . Electronically Signed   By: Kennith CenterEric  Mansell M.D.   On: 05/01/2016 14:45    Anti-infectives: Anti-infectives    Start     Dose/Rate Route Frequency Ordered Stop   04/27/16 0745  piperacillin-tazobactam (ZOSYN) IVPB 3.375 g     3.375 g 12.5 mL/hr over 240 Minutes Intravenous Every 8 hours 04/27/16 0731     04/27/16 0115  metroNIDAZOLE (FLAGYL) IVPB 500 mg     500 mg 100 mL/hr over 60 Minutes Intravenous  Once 04/27/16 0103 04/27/16 0424   04/27/16 0115  cefTRIAXone (ROCEPHIN) 2 g in dextrose 5 % 50 mL IVPB     2 g 100 mL/hr over 30 Minutes Intravenous  Once 04/27/16 0103 04/27/16 0305       Assessment/Plan Acute appendicitis - h/o partial appendectomy and drainage of abscess in [place] 02/04/16; never followed up for definitive appendectomy - CT scan 3/10 showed some appendicitis but no abscess or perforation - repeat CT 3/15 showed interval development of mesenteric and central pelvic rim enhancing fluid collections, suspicious for evolving multifocal abscess - WBC slightly up today at 12.4, TMAX 99.7  DM - SSI  ID - zosyn 3/11>> FEN - IVF, NPO VTE - heparin  Plan - Will review CT with IR. If they are able to perc drain him we will proceed with that, otherwise he may have to go to the OR. He is NPO and  we are holding his heparin.   LOS: 5 days    Edson SnowballBROOKE A MILLER , [facility] 05/02/2016, 8:15 AM Pager: [phone] Consults: [phone] Mon-Fri 7:00 am-4:30 pm Sat-Sun 7:00 am-11:30 am

## 2016-05-02 NOTE — Procedures (Signed)
Post op pelvic abscess  S/P CT ANTERIOR PELVIC ABSCESS DRAIN  No comp Stable Full report in PACS Serous blood tinged fld aspirated cx sent

## 2016-05-02 NOTE — Progress Notes (Signed)
Initial Nutrition Assessment  DOCUMENTATION CODES:   Obesity unspecified  INTERVENTION:   Premier Protein BID, each supplement provides 160kcal and 30g protein.   NUTRITION DIAGNOSIS:   Inadequate oral intake related to inability to eat as evidenced by NPO status.  GOAL:   Patient will meet greater than or equal to 90% of their needs  MONITOR:   Diet advancement, Supplement acceptance, Labs, Weight trends  REASON FOR ASSESSMENT:   NPO/Clear Liquid Diet    ASSESSMENT:   36 y/o male with h/o partial appendectomy and drainage of abscess in [place] 02/04/16; never followed up for definitive appendectomy. CT 3/15 showed interval development of mesenteric and central pelvic rim. Pt now with stump appendicitis with pelvic abscess       Pt not in room today at time of visit; pt in IR having pelvic drain placement. Spoke to RN, pt asking for food today and unhappy about being NPO. Pt has not advanced past full liquid diet since admit. Pt with inadequate intake for at least six days. Per chart, pt has lost 8lbs(3%) in four months; this is not significant. RD will order Premier Protein for when diet advanced. If pt not not eating well in the next 2-3 days consider nutrition support.    Medications reviewed and include: colace, heparin, insulin, protonix, zosyn, NaCl w/ KCl, morphine  Labs reviewed: Ca 8.3(L) Wbc- 12.4(H) cbgs- 164, 153 x 48hrs AIC 10.3(H) 3/11  Unable to complete Nutrition-Focused physical exam at this time.   Diet Order:  Diet NPO time specified  Skin:  Reviewed, no issues  Last BM:  3/13  Height:   Ht Readings from Last 1 Encounters:  04/27/16 6' (1.829 m)    Weight:   Wt Readings from Last 1 Encounters:  04/27/16 252 lb 6.4 oz (114.5 kg)    Ideal Body Weight:  80.9 kg  BMI:  Body mass index is 34.23 kg/m.  Estimated Nutritional Needs:   Kcal:  2400-2700kcal/day   Protein:  115-137g/day   Fluid:  >2.4L/day   EDUCATION NEEDS:   No  education needs identified at this time  Betsey Holidayasey Martese Vanatta, RD, LDN Pager #[phone]- [phone]

## 2016-05-03 DIAGNOSIS — Z9889 Other specified postprocedural states: Secondary | ICD-10-CM

## 2016-05-03 LAB — GLUCOSE, CAPILLARY
Glucose-Capillary: 137 mg/dL — ABNORMAL HIGH (ref 65–99)
Glucose-Capillary: 173 mg/dL — ABNORMAL HIGH (ref 65–99)
Glucose-Capillary: 107 mg/dL — ABNORMAL HIGH (ref 65–99)
Glucose-Capillary: 159 mg/dL — ABNORMAL HIGH (ref 65–99)

## 2016-05-03 LAB — BASIC METABOLIC PANEL
Anion gap: 9 (ref 5–15)
BUN: 7 mg/dL (ref 6–20)
CHLORIDE: 104 mmol/L (ref 101–111)
CO2: 23 mmol/L (ref 22–32)
CREATININE: 0.66 mg/dL (ref 0.61–1.24)
Calcium: 8.1 mg/dL — ABNORMAL LOW (ref 8.9–10.3)
GFR calc Af Amer: >60 mL/min (ref >60)
GFR calc non Af Amer: >60 mL/min (ref >60)
GLUCOSE: 152 mg/dL — AB (ref 65–99)
Potassium: 3.4 mmol/L — ABNORMAL LOW (ref 3.5–5.1)
Sodium: 136 mmol/L (ref 135–145)

## 2016-05-03 LAB — CBC
HEMATOCRIT: 35.3 % — AB (ref 39.0–52.0)
Hemoglobin: 12.2 g/dL — ABNORMAL LOW (ref 13.0–17.0)
MCH: 29.8 pg (ref 26.0–34.0)
MCHC: 34.6 g/dL (ref 30.0–36.0)
MCV: 86.1 fL (ref 78.0–100.0)
Platelets: 324 10*3/uL (ref 150–400)
RBC: 4.1 MIL/uL — ABNORMAL LOW (ref 4.22–5.81)
RDW: 13.1 % (ref 11.5–15.5)
WBC: 9 10*3/uL (ref 4.0–10.5)

## 2016-05-03 NOTE — Progress Notes (Signed)
Subjective: Reports pain less No nausea Passing flatus  Objective: Vital signs in last 24 hours: Temp:  [98.4 F (36.9 C)-99.9 F (37.7 C)] 98.6 F (37 C) (03/17 0501) Pulse Rate:  [102-117] 108 (03/17 0501) Resp:  [16-24] 16 (03/17 0501) BP: (132-191)/(81-114) 160/88 (03/17 0501) SpO2:  [96 %-100 %] 96 % (03/17 0501) Last BM Date: 05/02/16  Intake/Output from previous day: 03/16 0701 - 03/17 0700 In: 3080.4 [P.O.:120; I.V.:2860.4; IV Piggyback:100] Out: 1380 [Urine:1350; Drains:30] Intake/Output this shift: No intake/output data recorded.  Exam: Appears comfortable Abdomen soft with mild RLQ tenderness Drain serous  Lab Results:   Recent Labs  05/02/16 0449 05/03/16 0502  WBC 12.4* 9.0  HGB 12.5* 12.2*  HCT 36.8* 35.3*  PLT 306 324   BMET  Recent Labs  05/02/16 0449 05/03/16 0502  NA 137 136  K 3.5 3.4*  CL 107 104  CO2 21* 23  GLUCOSE 153* 152*  BUN 9 7  CREATININE 0.70 0.66  CALCIUM 8.3* 8.1*   PT/INR  Recent Labs  05/02/16 1018  LABPROT 13.2  INR 1.00   ABG No results for input(s): PHART, HCO3 in the last 72 hours.  Invalid input(s): PCO2, PO2  Studies/Results: Ct Guided Needle Placement  Result Date: 05/02/2016 INDICATION: Anterior pelvic abscess over the bladder EXAM: CT GUIDED DRAINAGE OF PELVIC ABSCESS MEDICATIONS: The patient is currently admitted to the hospital and receiving intravenous antibiotics. The antibiotics were administered within an appropriate time frame prior to the initiation of the procedure. ANESTHESIA/SEDATION: 3.0 mg IV Versed 150 mcg IV Fentanyl Moderate Sedation Time:  18 MINUTES The patient was continuously monitored during the procedure by the interventional radiology nurse under my direct supervision. COMPLICATIONS: None immediate. TECHNIQUE: Informed written consent was obtained from the patient after a thorough discussion of the procedural risks, benefits and alternatives. All questions were addressed.  Maximal Sterile Barrier Technique was utilized including caps, mask, sterile gowns, sterile gloves, sterile drape, hand hygiene and skin antiseptic. A timeout was performed prior to the initiation of the procedure. PROCEDURE: Previous imaging reviewed. Patient position supine. Noncontrast localization CT performed through the pelvis. Overlying skin marked just left of midline. Under sterile conditions and local anesthesia, an 18 gauge 10 cm access needle was advanced from an anterior pelvic approach into the pelvic fluid collection just superior to the bladder. Needle position confirmed with CT. Syringe aspiration yielded blood-tinged serous fluid. Sample sent for Gram stain and culture. Guidewire inserted followed by tract dilatation to insert a 10 [place] drain. Drain catheter position confirmed with CT. Catheter secured with a Prolene suture and connected to external suction bulb. Patient tolerated the procedure well. No immediate complication. FINDINGS: CT imaging confirms percutaneous needle access of the anterior pelvic fluid collection for drain insertion IMPRESSION: Successful CT-guided anterior pelvic abscess drain insertion. Electronically Signed   By: Judie Petit.  Shick M.D.   On: 05/02/2016 13:38   Ct Abdomen Pelvis W Contrast  Result Date: 05/01/2016 CLINICAL DATA:  History of partial appendectomy and abscess drainage in [place] on 02/04/2016. Patient ever had definitive appendectomy. Now with pain and leukocytosis. EXAM: CT ABDOMEN AND PELVIS WITH CONTRAST TECHNIQUE: Multidetector CT imaging of the abdomen and pelvis was performed using the standard protocol following bolus administration of intravenous contrast. CONTRAST:  1 ISOVUE-300 IOPAMIDOL (ISOVUE-300) INJECTION 61% COMPARISON:  04/26/2016 FINDINGS: Lower chest: Dependent subsegmental atelectasis noted both lower lobes. Small bilateral pleural effusions are evident. Hepatobiliary: No focal abnormality within the liver parenchyma. There is no evidence  for gallstones,  gallbladder wall thickening, or pericholecystic fluid. No intrahepatic or extrahepatic biliary dilation. Pancreas: No focal mass lesion. No dilatation of the main duct. No intraparenchymal cyst. No peripancreatic edema. Spleen: No splenomegaly. No focal mass lesion. Adrenals/Urinary Tract: No adrenal nodule or mass. Kidneys are unremarkable. No evidence for hydroureter. The urinary bladder appears normal for the degree of distention. Stomach/Bowel: Stomach is nondistended. No gastric wall thickening. No evidence of outlet obstruction. Duodenum is normally positioned as is the ligament of Treitz. Small bowel loops are mildly distended, measuring up to about 2.7 cm diameter. Distal small bowel loops and terminal ileum appear decompressed. Irregular and ill-defined appendiceal stump is again identified measuring 14 mm diameter. The inflammation in the right lower quadrant and central pelvis has progressed in the interval. There is more interloop mesenteric fluid. 2.0 x 8.2 cm mesenteric rim enhancing fluid collection is suspicious for evolving abscess. 8.8 x 5.5 x 6.3 cm rim enhancing irregular fluid collection is identified in the central pelvis, just cranial to the bladder, also suspicious for evolving abscess. Vascular/Lymphatic: No abdominal aortic aneurysm. No abdominal aortic atherosclerotic calcification. Small mesenteric lymph nodes are evident. Reproductive: Prostate gland unremarkable. Other: Small volume free fluid. Musculoskeletal: Bone windows reveal no worrisome lytic or sclerotic osseous lesions. IMPRESSION: 1. Interval development of mesenteric and central pelvic rim enhancing fluid collections, suspicious for evolving multifocal abscess. 2. Interval development of proximal and mid small bowel distention suggesting inflammatory ileus . Electronically Signed   By: Kennith CenterEric  Mansell M.D.   On: 05/01/2016 14:45    Anti-infectives: Anti-infectives    Start     Dose/Rate Route Frequency  Ordered Stop   04/27/16 0745  piperacillin-tazobactam (ZOSYN) IVPB 3.375 g     3.375 g 12.5 mL/hr over 240 Minutes Intravenous Every 8 hours 04/27/16 0731     04/27/16 0115  metroNIDAZOLE (FLAGYL) IVPB 500 mg     500 mg 100 mL/hr over 60 Minutes Intravenous  Once 04/27/16 0103 04/27/16 0424   04/27/16 0115  cefTRIAXone (ROCEPHIN) 2 g in dextrose 5 % 50 mL IVPB     2 g 100 mL/hr over 30 Minutes Intravenous  Once 04/27/16 0103 04/27/16 0305      Assessment/Plan:  Perforated appendicitis (stump)  WBC now normal after IR placed perc drain.  Will try full liquids Ambulate Continue antibiotics  LOS: 6 days    Jevaun Strick A 05/03/2016

## 2016-05-04 LAB — GLUCOSE, CAPILLARY
GLUCOSE-CAPILLARY: 119 mg/dL — AB (ref 65–99)
GLUCOSE-CAPILLARY: 171 mg/dL — AB (ref 65–99)
Glucose-Capillary: 180 mg/dL — ABNORMAL HIGH (ref 65–99)
Glucose-Capillary: 139 mg/dL — ABNORMAL HIGH (ref 65–99)

## 2016-05-04 MED ORDER — PANTOPRAZOLE SODIUM 40 MG PO TBEC
40.0000 mg | DELAYED_RELEASE_TABLET | Freq: Every day | ORAL | Status: DC
  Administered 2016-05-04: 40 mg via ORAL
  Filled 2016-05-04: qty 1

## 2016-05-04 NOTE — Progress Notes (Signed)
Subjective: Tolerated full liquids Abdominal pain much less Passing flatus, no nausea  Objective: Vital signs in last 24 hours: Temp:  [98 F (36.7 C)] 98 F (36.7 C) (03/17 1536) Pulse Rate:  [98] 98 (03/17 1536) Resp:  [20] 20 (03/17 1536) BP: (159)/(101) 159/101 (03/17 1536) SpO2:  [99 %] 99 % (03/17 1536) Last BM Date: 05/02/16  Intake/Output from previous day: 03/17 0701 - 03/18 0700 In: 2810 [P.O.:1200; I.V.:1500; IV Piggyback:100] Out: 620 [Urine:600; Drains:20] Intake/Output this shift: Total I/O In: 240 [P.O.:240] Out: -   Exam: Abdomen soft, almost non tender Drain serous  Lab Results:   Recent Labs  05/02/16 0449 05/03/16 0502  WBC 12.4* 9.0  HGB 12.5* 12.2*  HCT 36.8* 35.3*  PLT 306 324   BMET  Recent Labs  05/02/16 0449 05/03/16 0502  NA 137 136  K 3.5 3.4*  CL 107 104  CO2 21* 23  GLUCOSE 153* 152*  BUN 9 7  CREATININE 0.70 0.66  CALCIUM 8.3* 8.1*   PT/INR  Recent Labs  05/02/16 1018  LABPROT 13.2  INR 1.00   ABG No results for input(s): PHART, HCO3 in the last 72 hours.  Invalid input(s): PCO2, PO2  Studies/Results: Ct Guided Needle Placement  Result Date: 05/02/2016 INDICATION: Anterior pelvic abscess over the bladder EXAM: CT GUIDED DRAINAGE OF PELVIC ABSCESS MEDICATIONS: The patient is currently admitted to the hospital and receiving intravenous antibiotics. The antibiotics were administered within an appropriate time frame prior to the initiation of the procedure. ANESTHESIA/SEDATION: 3.0 mg IV Versed 150 mcg IV Fentanyl Moderate Sedation Time:  18 MINUTES The patient was continuously monitored during the procedure by the interventional radiology nurse under my direct supervision. COMPLICATIONS: None immediate. TECHNIQUE: Informed written consent was obtained from the patient after a thorough discussion of the procedural risks, benefits and alternatives. All questions were addressed. Maximal Sterile Barrier Technique was  utilized including caps, mask, sterile gowns, sterile gloves, sterile drape, hand hygiene and skin antiseptic. A timeout was performed prior to the initiation of the procedure. PROCEDURE: Previous imaging reviewed. Patient position supine. Noncontrast localization CT performed through the pelvis. Overlying skin marked just left of midline. Under sterile conditions and local anesthesia, an 18 gauge 10 cm access needle was advanced from an anterior pelvic approach into the pelvic fluid collection just superior to the bladder. Needle position confirmed with CT. Syringe aspiration yielded blood-tinged serous fluid. Sample sent for Gram stain and culture. Guidewire inserted followed by tract dilatation to insert a 10 [place] drain. Drain catheter position confirmed with CT. Catheter secured with a Prolene suture and connected to external suction bulb. Patient tolerated the procedure well. No immediate complication. FINDINGS: CT imaging confirms percutaneous needle access of the anterior pelvic fluid collection for drain insertion IMPRESSION: Successful CT-guided anterior pelvic abscess drain insertion. Electronically Signed   By: Judie PetitM.  Shick M.D.   On: 05/02/2016 13:38    Anti-infectives: Anti-infectives    Start     Dose/Rate Route Frequency Ordered Stop   04/27/16 0745  piperacillin-tazobactam (ZOSYN) IVPB 3.375 g     3.375 g 12.5 mL/hr over 240 Minutes Intravenous Every 8 hours 04/27/16 0731     04/27/16 0115  metroNIDAZOLE (FLAGYL) IVPB 500 mg     500 mg 100 mL/hr over 60 Minutes Intravenous  Once 04/27/16 0103 04/27/16 0424   04/27/16 0115  cefTRIAXone (ROCEPHIN) 2 g in dextrose 5 % 50 mL IVPB     2 g 100 mL/hr over 30 Minutes Intravenous  Once 04/27/16 0103 04/27/16 0305      Assessment/Plan:   Perforated stump appendicitis  Decrease IVF Soft diet Consider discharge with drain tomorrow  LOS: 7 days    Dollie Bressi A 05/04/2016

## 2016-05-04 NOTE — Progress Notes (Signed)
Referring Physician(s):  Dr. Armandina Gemma  Supervising Physician: Jacqulynn Cadet  Patient Status:  [name] - In-pt  Chief Complaint:  Pelvic abscess  Subjective: Patient tolerating diet.  Getting OOB regularly.  States he feeling better.   Allergies: Bee venom  Medications: Prior to Admission medications   Medication Sig Start Date End Date Taking? Authorizing Provider  Blood Glucose Monitoring Suppl ([place]) w/Device KIT 1 Act by Does not apply route 3 (three) times daily. 03/06/15  Yes Janith Lima, MD  glucose blood test strip Use TID 03/06/15  Yes Janith Lima, MD  ibuprofen (ADVIL,MOTRIN) 200 MG tablet Take 800 mg by mouth every 6 (six) hours as needed for moderate pain.    Yes Historical Provider, MD  Insulin Glargine (BASAGLAR KWIKPEN) 100 UNIT/ML SOPN Inject 1.1 mLs (110 Units total) into the skin at bedtime. And pen needles 3/day 12/20/15  Yes Renato Shin, MD  Insulin Pen Needle 31G X 5 MM MISC Use 1 needle daily with insulin Dx E11.9 09/05/15  Yes Janith Lima, MD  ondansetron (ZOFRAN) 4 MG tablet Take 1 tablet (4 mg total) by mouth every 8 (eight) hours as needed for nausea or vomiting. Patient not taking: Reported on 04/26/2016 12/29/15   Tereasa Coop, PA-C     Vital Signs: BP (!) 159/101 (BP Location: Left Arm) Comment: RN notified  Pulse 98   Temp 98 F (36.7 C) (Oral)   Resp 20   Ht 6' (1.829 m)   Wt 252 lb 6.4 oz (114.5 kg)   SpO2 99%   BMI 34.23 kg/m   Physical Exam  NAD, alert Abd: anterior pelvic abscess drain in place.  Site c/d/I.  Mild tenderness to touch.  Mostly serous output.    Imaging: Ct Guided Needle Placement  Result Date: 05/02/2016 INDICATION: Anterior pelvic abscess over the bladder EXAM: CT GUIDED DRAINAGE OF PELVIC ABSCESS MEDICATIONS: The patient is currently admitted to the hospital and receiving intravenous antibiotics. The antibiotics were administered within an appropriate time frame prior to the  initiation of the procedure. ANESTHESIA/SEDATION: 3.0 mg IV Versed 150 mcg IV Fentanyl Moderate Sedation Time:  18 MINUTES The patient was continuously monitored during the procedure by the interventional radiology nurse under my direct supervision. COMPLICATIONS: None immediate. TECHNIQUE: Informed written consent was obtained from the patient after a thorough discussion of the procedural risks, benefits and alternatives. All questions were addressed. Maximal Sterile Barrier Technique was utilized including caps, mask, sterile gowns, sterile gloves, sterile drape, hand hygiene and skin antiseptic. A timeout was performed prior to the initiation of the procedure. PROCEDURE: Previous imaging reviewed. Patient position supine. Noncontrast localization CT performed through the pelvis. Overlying skin marked just left of midline. Under sterile conditions and local anesthesia, an 18 gauge 10 cm access needle was advanced from an anterior pelvic approach into the pelvic fluid collection just superior to the bladder. Needle position confirmed with CT. Syringe aspiration yielded blood-tinged serous fluid. Sample sent for Gram stain and culture. Guidewire inserted followed by tract dilatation to insert a 10 [place] drain. Drain catheter position confirmed with CT. Catheter secured with a Prolene suture and connected to external suction bulb. Patient tolerated the procedure well. No immediate complication. FINDINGS: CT imaging confirms percutaneous needle access of the anterior pelvic fluid collection for drain insertion IMPRESSION: Successful CT-guided anterior pelvic abscess drain insertion. Electronically Signed   By: Jerilynn Mages.  Shick M.D.   On: 05/02/2016 13:38   Ct Abdomen Pelvis W  Contrast  Result Date: 05/01/2016 CLINICAL DATA:  History of partial appendectomy and abscess drainage in [place] on 02/04/2016. Patient ever had definitive appendectomy. Now with pain and leukocytosis. EXAM: CT ABDOMEN AND PELVIS WITH CONTRAST  TECHNIQUE: Multidetector CT imaging of the abdomen and pelvis was performed using the standard protocol following bolus administration of intravenous contrast. CONTRAST:  1 ISOVUE-300 IOPAMIDOL (ISOVUE-300) INJECTION 61% COMPARISON:  04/26/2016 FINDINGS: Lower chest: Dependent subsegmental atelectasis noted both lower lobes. Small bilateral pleural effusions are evident. Hepatobiliary: No focal abnormality within the liver parenchyma. There is no evidence for gallstones, gallbladder wall thickening, or pericholecystic fluid. No intrahepatic or extrahepatic biliary dilation. Pancreas: No focal mass lesion. No dilatation of the main duct. No intraparenchymal cyst. No peripancreatic edema. Spleen: No splenomegaly. No focal mass lesion. Adrenals/Urinary Tract: No adrenal nodule or mass. Kidneys are unremarkable. No evidence for hydroureter. The urinary bladder appears normal for the degree of distention. Stomach/Bowel: Stomach is nondistended. No gastric wall thickening. No evidence of outlet obstruction. Duodenum is normally positioned as is the ligament of Treitz. Small bowel loops are mildly distended, measuring up to about 2.7 cm diameter. Distal small bowel loops and terminal ileum appear decompressed. Irregular and ill-defined appendiceal stump is again identified measuring 14 mm diameter. The inflammation in the right lower quadrant and central pelvis has progressed in the interval. There is more interloop mesenteric fluid. 2.0 x 8.2 cm mesenteric rim enhancing fluid collection is suspicious for evolving abscess. 8.8 x 5.5 x 6.3 cm rim enhancing irregular fluid collection is identified in the central pelvis, just cranial to the bladder, also suspicious for evolving abscess. Vascular/Lymphatic: No abdominal aortic aneurysm. No abdominal aortic atherosclerotic calcification. Small mesenteric lymph nodes are evident. Reproductive: Prostate gland unremarkable. Other: Small volume free fluid. Musculoskeletal: Bone  windows reveal no worrisome lytic or sclerotic osseous lesions. IMPRESSION: 1. Interval development of mesenteric and central pelvic rim enhancing fluid collections, suspicious for evolving multifocal abscess. 2. Interval development of proximal and mid small bowel distention suggesting inflammatory ileus . Electronically Signed   By: Misty Stanley M.D.   On: 05/01/2016 14:45    Labs:  CBC:  Recent Labs  04/30/16 0531 05/01/16 0515 05/02/16 0449 05/03/16 0502  WBC 12.3* 11.1* 12.4* 9.0  HGB 12.8* 13.7 12.5* 12.2*  HCT 37.7* 40.6 36.8* 35.3*  PLT 269 293 306 324    COAGS:  Recent Labs  05/02/16 1018  INR 1.00    BMP:  Recent Labs  04/29/16 0547 04/30/16 0531 05/02/16 0449 05/03/16 0502  NA 136 135 137 136  K 3.7 3.7 3.5 3.4*  CL 108 107 107 104  CO2 20* 20* 21* 23  GLUCOSE 159* 164* 153* 152*  BUN _0 CALCIUM 8.5* 8.1* 8.3* 8.1*  CREATININE 0.76 0.76 0.70 0.66  GFRNONAA >60 >60 >60 >60  GFRAA >60 >60 >60 >60    LIVER FUNCTION TESTS:  Recent Labs  12/29/15 1547 04/26/16 2214  BILITOT 0.5 1.0  AST 19 22  ALT 30 40  ALKPHOS 69 58  PROT 7.7 7.4  ALBUMIN 4.3 4.0    Assessment and Plan: Pelvic abscess s/p anterior pelvic drain placement 3/16 Drain in place with mostly serous output- 20 mL recorded over past 24 hrs.  WBC improved.  Remains afebrile.  Cultures pending.  Continue routine drain care.  IR to follow.   Electronically Signed: Docia Barrier 05/04/2016, 10:16 AM   I spent a total of 15 Minutes at the the patient's bedside  AND on the patient's hospital floor or unit, greater than 50% of which was counseling/coordinating care for pelvic abscess.

## 2016-05-05 ENCOUNTER — Other Ambulatory Visit: Payer: Self-pay | Admitting: Radiology

## 2016-05-05 DIAGNOSIS — R188 Other ascites: Secondary | ICD-10-CM

## 2016-05-05 LAB — CBC
HCT: 38.6 % — ABNORMAL LOW (ref 39.0–52.0)
Hemoglobin: 12.8 g/dL — ABNORMAL LOW (ref 13.0–17.0)
MCH: 28.4 pg (ref 26.0–34.0)
MCHC: 33.2 g/dL (ref 30.0–36.0)
MCV: 85.6 fL (ref 78.0–100.0)
PLATELETS: 412 10*3/uL — AB (ref 150–400)
RBC: 4.51 MIL/uL (ref 4.22–5.81)
RDW: 12.8 % (ref 11.5–15.5)
WBC: 5.8 10*3/uL (ref 4.0–10.5)

## 2016-05-05 LAB — GLUCOSE, CAPILLARY
Glucose-Capillary: 133 mg/dL — ABNORMAL HIGH (ref 65–99)
Glucose-Capillary: 145 mg/dL — ABNORMAL HIGH (ref 65–99)

## 2016-05-05 LAB — BASIC METABOLIC PANEL WITH GFR
Anion gap: 9 (ref 5–15)
BUN: 7 mg/dL (ref 6–20)
CO2: 23 mmol/L (ref 22–32)
Calcium: 9 mg/dL (ref 8.9–10.3)
Chloride: 107 mmol/L (ref 101–111)
Creatinine, Ser: 0.75 mg/dL (ref 0.61–1.24)
GFR calc Af Amer: >60 mL/min
GFR calc non Af Amer: >60 mL/min
Glucose, Bld: 141 mg/dL — ABNORMAL HIGH (ref 65–99)
Potassium: 4 mmol/L (ref 3.5–5.1)
Sodium: 139 mmol/L (ref 135–145)

## 2016-05-05 MED ORDER — HYDROCHLOROTHIAZIDE 12.5 MG PO CAPS: 12.5000 mg | ORAL_CAPSULE | Freq: Every day | ORAL | 0 refills | Status: DC

## 2016-05-05 MED ORDER — POLYETHYLENE GLYCOL 3350 17 G PO PACK: PACK | ORAL | 0 refills | Status: DC

## 2016-05-05 MED ORDER — SACCHAROMYCES BOULARDII 250 MG PO CAPS: ORAL_CAPSULE | ORAL | Status: DC

## 2016-05-05 MED ORDER — AMOXICILLIN-POT CLAVULANATE 875-125 MG PO TABS
1.0000 | ORAL_TABLET | Freq: Two times a day (BID) | ORAL | Status: DC
  Administered 2016-05-05: 1 via ORAL
  Filled 2016-05-05: qty 1

## 2016-05-05 MED ORDER — HYDROCHLOROTHIAZIDE 12.5 MG PO CAPS
12.5000 mg | ORAL_CAPSULE | Freq: Every day | ORAL | Status: DC
  Administered 2016-05-05: 12.5 mg via ORAL
  Filled 2016-05-05: qty 1

## 2016-05-05 MED ORDER — AMOXICILLIN-POT CLAVULANATE 875-125 MG PO TABS: 1.0000 | ORAL_TABLET | Freq: Two times a day (BID) | ORAL | 0 refills | Status: DC

## 2016-05-05 MED ORDER — INSULIN GLARGINE 100 UNIT/ML ~~LOC~~ SOLN
25.0000 [IU] | Freq: Every day | SUBCUTANEOUS | Status: DC
  Administered 2016-05-05: 25 [IU] via SUBCUTANEOUS
  Filled 2016-05-05: qty 0.25

## 2016-05-05 MED ORDER — LISINOPRIL 10 MG PO TABS: 10.0000 mg | ORAL_TABLET | Freq: Every day | ORAL | 0 refills | Status: DC

## 2016-05-05 MED ORDER — POTASSIUM CHLORIDE CRYS ER 20 MEQ PO TBCR
20.0000 meq | EXTENDED_RELEASE_TABLET | Freq: Every day | ORAL | Status: DC
  Administered 2016-05-05: 20 meq via ORAL
  Filled 2016-05-05: qty 1

## 2016-05-05 MED ORDER — SACCHAROMYCES BOULARDII 250 MG PO CAPS
250.0000 mg | ORAL_CAPSULE | Freq: Two times a day (BID) | ORAL | Status: DC
  Administered 2016-05-05: 250 mg via ORAL
  Filled 2016-05-05: qty 1

## 2016-05-05 MED ORDER — LISINOPRIL 10 MG PO TABS
10.0000 mg | ORAL_TABLET | Freq: Every day | ORAL | Status: DC
  Administered 2016-05-05: 10 mg via ORAL
  Filled 2016-05-05: qty 1

## 2016-05-05 MED ORDER — BASAGLAR KWIKPEN 100 UNIT/ML ~~LOC~~ SOPN: PEN_INJECTOR | SUBCUTANEOUS | 11 refills | Status: DC

## 2016-05-05 MED ORDER — IBUPROFEN 200 MG PO TABS: ORAL_TABLET | ORAL | 0 refills | Status: DC

## 2016-05-05 MED ORDER — HYDROCODONE-ACETAMINOPHEN 5-325 MG PO TABS: 1.0000 | ORAL_TABLET | ORAL | 0 refills | Status: DC | PRN

## 2016-05-05 MED ORDER — POLYETHYLENE GLYCOL 3350 17 G PO PACK
17.0000 g | PACK | Freq: Every day | ORAL | Status: DC
  Administered 2016-05-05: 17 g via ORAL
  Filled 2016-05-05: qty 1

## 2016-05-05 MED ORDER — POTASSIUM CHLORIDE CRYS ER 20 MEQ PO TBCR: 20.0000 meq | EXTENDED_RELEASE_TABLET | Freq: Every day | ORAL | 1 refills | Status: DC

## 2016-05-05 NOTE — Discharge Instructions (Signed)
Appendicitis The appendix is a tube that is shaped like a finger. It is connected to the large intestine. Appendicitis means that this tube is swollen (inflamed). Without treatment, the tube can tear (rupture). This can lead to a life-threatening infection. It can also cause you to have sores (abscesses). These sores hurt. What are the causes? This condition may be caused by something that blocks the appendix, such as:  A ball of poop (stool).  Lymph glands that are bigger than normal. Sometimes, the cause is not known. What are the signs or symptoms? Symptoms of this condition include:  Pain around the belly button (navel).  The pain moves toward the lower right belly (abdomen).  The pain can get worse with time.  The pain can get worse if you cough.  The pain can get worse if you move suddenly.  Tenderness in the lower right belly.  Feeling sick to your stomach (nauseous).  Throwing up (vomiting).  Not feeling hungry (loss of appetite).  A fever.  Having a hard time pooping (constipation).  Watery poop (diarrhea).  Not feeling well. How is this treated? Usually, this condition is treated by taking out the appendix (appendectomy). There are two ways that the appendix can be taken out:  Open surgery. In this surgery, the appendix is taken out through a large cut (incision). The cut is made in the lower right belly. This surgery may be picked if:  You have scars from another surgery.  You have a bleeding condition.  You are pregnant and will be having your baby soon.  You have a condition that does not allow the other type of surgery.  Laparoscopic surgery. In this surgery, the appendix is taken out through small cuts. Often, this surgery:  Causes less pain.  Causes fewer problems.  Is easier to heal from. If your appendix tears and a sore forms:  A drain may be put into the sore. The drain will be used to get rid of fluid.  You may get an antibiotic  medicine through an IV tube.  Your appendix may or may not need to be taken out. This information is not intended to replace advice given to you by your health care provider. Make sure you discuss any questions you have with your health care provider. Document Released: 04/28/2011 Document Revised: 07/12/2015 Document Reviewed: 06/21/2014 Elsevier Interactive Patient Education  2017 Elsevier Inc.  Percutaneous Abscess Drain Percutaneous abscess drain is removal of a collection of infected fluid inside the body (abscess). This is done by placing a thin needle under the skin and moving it into the abscess. A small tube (catheter) is inserted during the procedure and left in place for a few days to continue to drain the abscess. Tell a health care provider about:  Any allergies you have.  All medicines you are taking, including vitamins, herbs, eye drops, creams, and over-the-counter medicines.  Any problems you or family members have had with anesthetic medicines.  Any blood disorders you have.  Any surgeries you have had.  Any medical conditions you have.  Whether you are pregnant or may be pregnant.  Any history of tobacco use or smoking. What are the risks? Generally, this is a safe procedure. However, problems may occur, including:  Infection.  Bleeding.  Allergic reaction to medicines or materials used.  Damage to other structures or organs.  Blockage of the catheter, requiring placement of a new catheter.  A need to repeat the procedure.  Failure of the procedure  to drain the abscess completely, requiring an open surgical procedure to drain the abscess. An open procedure is done through a larger incision. What happens before the procedure? Medicines   Ask your health care provider about:  Changing or stopping your regular medicines. This is especially important if you are taking diabetes medicines or blood thinners.  Taking medicines such as aspirin and  ibuprofen. These medicines can thin your blood. Do not take these medicines before your procedure if your health care provider instructs you not to. Staying hydrated  Follow instructions from your health care provider about hydration, which may include:  Up to 2 hours before the procedure - you may continue to drink clear liquids, such as water, clear fruit juice, black coffee, and plain tea. Eating and drinking restrictions  Follow instructions from your health care provider about eating and drinking, which may include:  8 hours before the procedure - stop eating heavy meals or foods such as meat, fried foods, or fatty foods.  6 hours before the procedure - stop eating light meals or foods, such as toast or cereal.  6 hours before the procedure - stop drinking milk or drinks that contain milk.  2 hours before the procedure - stop drinking clear liquids. General instructions    Plan to have someone take you home from the hospital or clinic.  If you will be going home right after the procedure, plan to have someone with you for 24 hours.  You may have blood tests or urine tests.  You may get a tetanus shot.  You may have imaging tests, such as an ultrasound, to check how large or deep your abscess is. What happens during the procedure?  To lower your risk of infection:  Your health care team will wash or sanitize their hands.  The skin around the abscess will be washed with soap.  Hair may be removed from the surgical site.  An IV tube will be inserted into one of your veins.  You will be given medicine to numb the area (local anesthetic) where the catheter will be placed. Placement of the catheter varies depending on where your abscess is located.  You may be given medicine to help you relax (sedative) or medicine to make you fall asleep (general anesthetic).  A small incision will be made in your skin.  A needle will be inserted under your skin and moved into the  abscess. Images from ultrasound, X-ray, or a CT scan will be used to help guide the needle to the abscess.  A catheter will be inserted into your incision and moved underneath your skin until it reaches the abscess. Images will be used to help guide the catheter to the abscess.  After the catheter is in place, the needle will be removed. The catheter will be connected to a bag outside of your body. The catheter will stay in place until the fluid has stopped draining and the infection is gone. The procedure may vary among health care providers and hospitals. What happens after the procedure?  Your blood pressure, heart rate, breathing rate, and blood oxygen level will be monitored until the medicines you were given have worn off.  You may have some pain or nausea. Medicines will be available to help you. Summary  An abscess is a collection of infected fluid inside the body.  During this procedure, images from ultrasound, X-rays, or a CT scan are used to help guide the needle and catheter to the abscess.  A catheter will be left in place to continue to drain the abscess after the procedure. This information is not intended to replace advice given to you by your health care provider. Make sure you discuss any questions you have with your health care provider. Document Released: 06/20/2013 Document Revised: 12/27/2015 Document Reviewed: 12/27/2015 Elsevier Interactive Patient Education  2017 [employer]

## 2016-05-05 NOTE — Progress Notes (Signed)
Referring Physician(s): Clent Demark  Supervising Physician: Corrie Mckusick  Patient Status:  [facility] - In-pt  Chief Complaint: Pelvic abscess   Subjective:  Pt awaiting d/c home today; constipated and asking for miralax; denies N/V; has some soreness at drain site mid pelvis  Allergies: Bee venom  Medications: Prior to Admission medications   Medication Sig Start Date End Date Taking? Authorizing Provider  Blood Glucose Monitoring Suppl ([place]) w/Device KIT 1 Act by Does not apply route 3 (three) times daily. 03/06/15  Yes Janith Lima, MD  glucose blood test strip Use TID 03/06/15  Yes Janith Lima, MD  ibuprofen (ADVIL,MOTRIN) 200 MG tablet Take 800 mg by mouth every 6 (six) hours as needed for moderate pain.    Yes Historical Provider, MD  Insulin Glargine (BASAGLAR KWIKPEN) 100 UNIT/ML SOPN Inject 1.1 mLs (110 Units total) into the skin at bedtime. And pen needles 3/day 12/20/15  Yes Renato Shin, MD  Insulin Pen Needle 31G X 5 MM MISC Use 1 needle daily with insulin Dx E11.9 09/05/15  Yes Janith Lima, MD  ondansetron (ZOFRAN) 4 MG tablet Take 1 tablet (4 mg total) by mouth every 8 (eight) hours as needed for nausea or vomiting. Patient not taking: Reported on 04/26/2016 12/29/15   Tereasa Coop, PA-C     Vital Signs: BP (!) 148/103 (BP Location: Right Arm)   Pulse 95   Temp 98.8 F (37.1 C) (Oral)   Resp 20   Ht 6' (1.829 m)   Wt 252 lb 6.4 oz (114.5 kg)   SpO2 97%   BMI 34.23 kg/m   Physical Exam pelvic drain intact, output 10 cc serous fluid; cx neg to date, dressing dry, site mildly tender  Imaging: Ct Guided Needle Placement  Result Date: 05/02/2016 INDICATION: Anterior pelvic abscess over the bladder EXAM: CT GUIDED DRAINAGE OF PELVIC ABSCESS MEDICATIONS: The patient is currently admitted to the hospital and receiving intravenous antibiotics. The antibiotics were administered within an appropriate time frame prior to the initiation of  the procedure. ANESTHESIA/SEDATION: 3.0 mg IV Versed 150 mcg IV Fentanyl Moderate Sedation Time:  18 MINUTES The patient was continuously monitored during the procedure by the interventional radiology nurse under my direct supervision. COMPLICATIONS: None immediate. TECHNIQUE: Informed written consent was obtained from the patient after a thorough discussion of the procedural risks, benefits and alternatives. All questions were addressed. Maximal Sterile Barrier Technique was utilized including caps, mask, sterile gowns, sterile gloves, sterile drape, hand hygiene and skin antiseptic. A timeout was performed prior to the initiation of the procedure. PROCEDURE: Previous imaging reviewed. Patient position supine. Noncontrast localization CT performed through the pelvis. Overlying skin marked just left of midline. Under sterile conditions and local anesthesia, an 18 gauge 10 cm access needle was advanced from an anterior pelvic approach into the pelvic fluid collection just superior to the bladder. Needle position confirmed with CT. Syringe aspiration yielded blood-tinged serous fluid. Sample sent for Gram stain and culture. Guidewire inserted followed by tract dilatation to insert a 10 [place] drain. Drain catheter position confirmed with CT. Catheter secured with a Prolene suture and connected to external suction bulb. Patient tolerated the procedure well. No immediate complication. FINDINGS: CT imaging confirms percutaneous needle access of the anterior pelvic fluid collection for drain insertion IMPRESSION: Successful CT-guided anterior pelvic abscess drain insertion. Electronically Signed   By: Jerilynn Mages.  Shick M.D.   On: 05/02/2016 13:38   Ct Abdomen Pelvis W Contrast  Result Date:  05/01/2016 CLINICAL DATA:  History of partial appendectomy and abscess drainage in [place] on 02/04/2016. Patient ever had definitive appendectomy. Now with pain and leukocytosis. EXAM: CT ABDOMEN AND PELVIS WITH CONTRAST TECHNIQUE:  Multidetector CT imaging of the abdomen and pelvis was performed using the standard protocol following bolus administration of intravenous contrast. CONTRAST:  1 ISOVUE-300 IOPAMIDOL (ISOVUE-300) INJECTION 61% COMPARISON:  04/26/2016 FINDINGS: Lower chest: Dependent subsegmental atelectasis noted both lower lobes. Small bilateral pleural effusions are evident. Hepatobiliary: No focal abnormality within the liver parenchyma. There is no evidence for gallstones, gallbladder wall thickening, or pericholecystic fluid. No intrahepatic or extrahepatic biliary dilation. Pancreas: No focal mass lesion. No dilatation of the main duct. No intraparenchymal cyst. No peripancreatic edema. Spleen: No splenomegaly. No focal mass lesion. Adrenals/Urinary Tract: No adrenal nodule or mass. Kidneys are unremarkable. No evidence for hydroureter. The urinary bladder appears normal for the degree of distention. Stomach/Bowel: Stomach is nondistended. No gastric wall thickening. No evidence of outlet obstruction. Duodenum is normally positioned as is the ligament of Treitz. Small bowel loops are mildly distended, measuring up to about 2.7 cm diameter. Distal small bowel loops and terminal ileum appear decompressed. Irregular and ill-defined appendiceal stump is again identified measuring 14 mm diameter. The inflammation in the right lower quadrant and central pelvis has progressed in the interval. There is more interloop mesenteric fluid. 2.0 x 8.2 cm mesenteric rim enhancing fluid collection is suspicious for evolving abscess. 8.8 x 5.5 x 6.3 cm rim enhancing irregular fluid collection is identified in the central pelvis, just cranial to the bladder, also suspicious for evolving abscess. Vascular/Lymphatic: No abdominal aortic aneurysm. No abdominal aortic atherosclerotic calcification. Small mesenteric lymph nodes are evident. Reproductive: Prostate gland unremarkable. Other: Small volume free fluid. Musculoskeletal: Bone windows reveal  no worrisome lytic or sclerotic osseous lesions. IMPRESSION: 1. Interval development of mesenteric and central pelvic rim enhancing fluid collections, suspicious for evolving multifocal abscess. 2. Interval development of proximal and mid small bowel distention suggesting inflammatory ileus . Electronically Signed   By: Misty Stanley M.D.   On: 05/01/2016 14:45    Labs:  CBC:  Recent Labs  05/01/16 0515 05/02/16 0449 05/03/16 0502 05/05/16 0850  WBC 11.1* 12.4* 9.0 5.8  HGB 13.7 12.5* 12.2* 12.8*  HCT 40.6 36.8* 35.3* 38.6*  PLT 293 306 324 412*    COAGS:  Recent Labs  05/02/16 1018  INR 1.00    BMP:  Recent Labs  04/30/16 0531 05/02/16 0449 05/03/16 0502 05/05/16 0850  NA 135 137 136 139  K 3.7 3.5 3.4* 4.0  CL 107 107 104 107  CO2 20* 21* 23 23  GLUCOSE 164* 153* 152* 141*  BUN '11 9 7 7  ' CALCIUM 8.1* 8.3* 8.1* 9.0  CREATININE 0.76 0.70 0.66 0.75  GFRNONAA >60 >60 >60 >60  GFRAA >60 >60 >60 >60    LIVER FUNCTION TESTS:  Recent Labs  12/29/15 1547 04/26/16 2214  BILITOT 0.5 1.0  AST 19 22  ALT 30 40  ALKPHOS 69 58  PROT 7.7 7.4  ALBUMIN 4.3 4.0    Assessment and Plan: Stump appendicitis with pelvic abscess; s/p drain placement 3/16; AF; WBC nl; hgb stable; creat nl; fluid cx neg to date; rec once daily irrigation of drain with 5-10 cc sterile NS , recording of output and dressing change every 1-2 days ; will set pt up for f/u CT at drain clinic next week.    Electronically Signed: D. Rowe Robert 05/05/2016, 10:51 AM  I spent a total of 15 minutes at the the patient's bedside AND on the patient's hospital floor or unit, greater than 50% of which was counseling/coordinating care for pelvic drain    Patient ID: Joseph [name], male   DOB: 12-Feb-1981, 36 y.o.   MRN: [id number]

## 2016-05-05 NOTE — Progress Notes (Signed)
Pt was ambulating and tolerating his diet with no complaints. Pain was under control.  D/C instructions were given as well as prescriptions.  Understanding was verbalized.

## 2016-05-05 NOTE — Progress Notes (Signed)
Subjective: He looks good says he has not had a good BM for some time.  Just started PO's yesterday first meal.     Objective: Vital signs in last 24 hours: Temp:  [98.4 F (36.9 C)-98.8 F (37.1 C)] 98.8 F (37.1 C) (03/19 0536) Pulse Rate:  [95-100] 95 (03/19 0536) Resp:  [20] 20 (03/19 0536) BP: (148-165)/(103-108) 148/103 (03/19 0536) SpO2:  [97 %-100 %] 97 % (03/19 0536) Last BM Date: 05/02/16 840 PO 1000 IV Urine 750 recorded Drain 10 NO BM recorded Afebrile, BP very high, diastolic >962 NO labs since 05/03/16 Glucose 119-180    Intake/Output from previous day: 03/18 0701 - 03/19 0700 In: 1845 [P.O.:840; I.V.:950; IV Piggyback:50] Out: 760 [Urine:750; Drains:10] Intake/Output this shift: No intake/output data recorded.  General appearance: alert, cooperative and no distress Resp: clear to auscultation bilaterally GI: soft, non-tender; bowel sounds normal; no masses,  no organomegaly and drain is serous.    Lab Results:   Recent Labs  05/03/16 0502  WBC 9.0  HGB 12.2*  HCT 35.3*  PLT 324    BMET  Recent Labs  05/03/16 0502  NA 136  K 3.4*  CL 104  CO2 23  GLUCOSE 152*  BUN 7  CREATININE 0.66  CALCIUM 8.1*   PT/INR  Recent Labs  05/02/16 1018  LABPROT 13.2  INR 1.00    No results for input(s): AST, ALT, ALKPHOS, BILITOT, PROT, ALBUMIN in the last 168 hours.   Lipase     Component Value Date/Time   LIPASE 27 04/26/2016 2214     Studies/Results: No results found. Prior to Admission medications   Medication Sig Start Date End Date Taking? Authorizing Provider  Blood Glucose Monitoring Suppl ([place]) w/Device KIT 1 Act by Does not apply route 3 (three) times daily. 03/06/15  Yes Janith Lima, MD  glucose blood test strip Use TID 03/06/15  Yes Janith Lima, MD  ibuprofen (ADVIL,MOTRIN) 200 MG tablet Take 800 mg by mouth every 6 (six) hours as needed for moderate pain.    Yes Historical Provider, MD  Insulin  Glargine (BASAGLAR KWIKPEN) 100 UNIT/ML SOPN Inject 1.1 mLs (110 Units total) into the skin at bedtime. And pen needles 3/day 12/20/15  Yes Renato Shin, MD  Insulin Pen Needle 31G X 5 MM MISC Use 1 needle daily with insulin Dx E11.9 09/05/15  Yes Janith Lima, MD  ondansetron (ZOFRAN) 4 MG tablet Take 1 tablet (4 mg total) by mouth every 8 (eight) hours as needed for nausea or vomiting. Patient not taking: Reported on 04/26/2016 12/29/15   Tereasa Coop, PA-C    Medications: . docusate sodium  100 mg Oral BID  . heparin  5,000 Units Subcutaneous Q8H  . insulin aspart  0-9 Units Subcutaneous TID WC  . pantoprazole  40 mg Oral QHS  . piperacillin-tazobactam (ZOSYN)  IV  3.375 g Intravenous Q8H  . protein supplement shake  11 oz Oral BID BM   . 0.45 % NaCl with KCl 20 mEq / L 50 mL/hr at 05/04/16 1249    Assessment/Plan Recurrent acute appendicitis h/o partial appendectomy and drainage of abscess in [place] 02/04/16; never followed up for definitive appendectomy - CT scan 3/10 showed some appendicitis but no abscess or perforation  Follow up for interval appendectomy with Dr. Marlou Starks IR drain placed 05/02/16 Type II diabetes - A1C 10.3 - on Insulin at home ID - zosyn 3/11 =>> day 9 FEN - IVF, soft diet =>>  carb mod VTE - heparin   On 110 units of Lantus insulin at home.  He has no pain, drainage is serous.  Will need to learn drain care, get back on insulin, start oral antibiotics, start probiotics.  I will ask Diabetes Coordinator to see and make recommendations on Insulin.  Recheck labs, ask IR to set up for follow up.   We need to have his BP treated also  - start lisinopril/hctz low dose.  Talked to diabetes Coordinatior and she notes he was put on Lantus because of non compliance.  She would recommend a sliding scale, but with his hx we are going to start him on 25 units of Lantus and refer him back to his PCP.  LOS: 8 days    Joseph [name] 05/05/2016 [phone]

## 2016-05-05 NOTE — Progress Notes (Signed)
Discharge planning, spoke with patient at beside. [facility], [facility] They will follow for d/c needs, anticipate d/c today. [phone]

## 2016-05-05 NOTE — Progress Notes (Signed)
Inpatient Diabetes Program Recommendations  AACE/ADA: New Consensus Statement on Inpatient Glycemic Control (2015)  Target Ranges:  Prepandial:   less than 140 mg/dL      Peak postprandial:   less than 180 mg/dL (1-2 hours)      Critically ill patients:  140 - 180 mg/dL   Results for Cheron EveryWHITE, [place] (MRN [id number]) as of 05/05/2016 10:10  Ref. Range 05/04/2016 08:12 05/04/2016 11:40 05/04/2016 17:21 05/04/2016 21:27  Glucose-Capillary Latest Ref Range: 65 - 99 mg/dL 782119 (H) 956180 (H) 213171 (H) 139 (H)   Results for Cheron EveryWHITE, [place] (MRN [id number]) as of 05/05/2016 10:10  Ref. Range 05/05/2016 07:33  Glucose-Capillary Latest Ref Range: 65 - 99 mg/dL 629133 (H)    Home DM Meds: Basaglar 110 units QHS  Current Insulin Orders: Novolog Sensitive Correction Scale/ SSI (0-9 units) TID AC     MD- Per notes, Patient works 3rd shift and takes insulin daily b/c he was unsuccessful with BID injections at home.  Per Endocrinology notes ([facility]. [facility] patient misses his insulin approximately 1x/week. Does not check blood sugars regularly.  I am perplexed that patient takes such a large dose of basal insulin at home and has not received any basal insulin since admission and his CBGs have been stable!  AM CBG today is 133 mg/dl.  Note patient to start solid PO diet today.  I'm not sure patient needs basal insulin right now since his fasting CBGs have been OK.  Perhaps he will need some rapid-acting insulin to cover his meals once he starts eating a solid diet??  Plan to speak with patient today to discuss his home insulin regimen and encourage him to follow-up with his ENDO.    --Will follow patient during hospitalization--  Ambrose FinlandJeannine Johnston Barnie Sopko RN, MSN, CDE Diabetes Coordinator Inpatient Glycemic Control Team Team Pager: [phone] (8a-5p)

## 2016-05-05 NOTE — Progress Notes (Signed)
Spoke with pt this afternoon.  Discussed home insulin regimen with patient.  Patient told me he gives himself Basaglar insulin 110 units daily around 12pm (gives self 80 units in one injection site and then another 30 units in another injection site).  Remembers to rotate regularly.  Does not check CBGs often.  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  Discussed with patient that he has not received any basal insulin in over one week and that his CBGs are actually stable off the basal insulin.  Pt told me he used to take 70/30 insulin bid but was switched to once daily Basaglar by Dr. Everardo AllEllison.  Patient wanted to know what to do about his Basaglar insulin.  Told pt that I needed to discuss his CBGs trends and insulin requirements with Zola ButtonWill Jennings, PA for surgery before decision could be made about his home insulin.  Spoke with Will, PA this afternoon.  Decision made to start patient back on Lantus 25 units daily (~25% home dose).  Patient needs to check CBGs often at home and follow up with Dr. Everardo AllEllison soon after d/c so further adjustments can be made to his home insulin.    --Will follow patient during hospitalization--  Joseph FinlandJeannine Johnston Thuan Tippett RN, MSN, CDE Diabetes Coordinator Inpatient Glycemic Control Team Team Pager: [phone] (8a-5p)

## 2016-05-05 NOTE — Discharge Summary (Signed)
Physician Discharge Summary  Patient ID: Joseph [name] MRN: [id number] DOB/AGE: 03/25/1980 36 y.o.  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 He originally thought it was constipation. He states he had incision and drainage at least partial appendectomy [place] 12/20 17. He improved on antibiotics was supposed to follow-up for a definitive appendectomy but never did. CT scan performed in the ED on admission shows what appears to be some appendicitis but no evidence of abscess or perforation. He was admitted by Dr. Marlou Starks and started on medical management. We tried to slowly advance his diet while on medical management. He continued having ongoing abdominal pain, with poor oral intake. He underwent repeat CT scan on 05/01/16. This showed an. Irregular and ill-defined appendiceal stump is again identified measuring 14 mm diameter. The inflammation in the right lower quadrant and central pelvis has progressed in the interval. There is more interloop mesenteric fluid. 2.0 x 8.2 cm mesenteric rim enhancing fluid collection is suspicious for evolving abscess. 8.8 x 5.5 x 6.3 cm rim enhancing irregular fluid collection is identified in the central pelvis, just cranial to the bladder, also suspicious for evolving abscess. After the study he was evaluated by interventional radiology and underwent placement of an IR drain. Culture showed  some WBCs present but no organisms and there as no growth after 48 hours. His nausea improved within 24 hours of drainage. His diet was slowly advanced. He was up to a soft diet on the a.m. of 05/05/16. He is tolerating this. The drainage from his IR drain was clears serous fluid. His blood pressure was poorly controlled with blood pressure ranging from 159/101 2 161/124. He denies prior treatment for hypertension, but he was placed on lisinopril 10 mg daily and hydrochlorothiazide 12.5 mg daily. His by mouth intake had not been very good but even with the onset of a soft diet his glucose was well controlled between 119 and 180. We converted him back to a carb modified diet and I had the diabetes coordinator see him. He has a history of noncompliance. His hemoglobin A1c was 10.3. Her initial recommendation was maintained on a sliding scale. Her second recommendation would be to resume his Lantus at 25 units per day. We will gone forward with that plan, and he is scheduled to see Dr. Loanne Drilling later this week, to check his diabetes and blood pressure. I recommended he contact his primary care provider and set up a follow-up appointment in about 1-2 weeks to maintain diabetes and blood pressure control. By the afternoon of 05/05/16 he was anxious to go home. He was taught drain care, home health was arranged, he was given specific instructions on his diabetes and blood pressure. At this point he was ready for discharge.  CBC Latest Ref Rng & Units 05/05/2016 05/03/2016 05/02/2016  WBC 4.0 - 10.5 K/uL 5.8 9.0 12.4(H)  Hemoglobin 13.0 - 17.0 g/dL 12.8(L) 12.2(L) 12.5(L)  Hematocrit 39.0 - 52.0 % 38.6(L) 35.3(L) 36.8(L)  Platelets 150 - 400 K/uL 412(H) 324 306    CMP Latest Ref Rng & Units 05/05/2016 05/03/2016 05/02/2016  Glucose 65 - 99 mg/dL 141(H) 152(H) 153(H)  BUN 6 - 20 mg/dL '7 7 9  ' Creatinine 0.61 - 1.24 mg/dL 0.75 0.66 0.70  Sodium 135 - 145 mmol/L 139 136 137  Potassium 3.5 - 5.1 mmol/L 4.0 3.4(L) 3.5   Chloride 101 - 111 mmol/L 107 104 107  CO2 22 - 32 mmol/L 23 23 21(L)  Calcium 8.9 - 10.3 mg/dL 9.0 8.1(L) 8.3(L)  Total Protein 6.5 - 8.1 g/dL - - -  Total Bilirubin 0.3 - 1.2 mg/dL - - -  Alkaline Phos 38 - 126 U/L - - -  AST 15 - 41 U/L - - -  ALT 17 - 63 U/L - - -   IR will arrange follow-up, and repeat CT scan for drain management. He will see Dr. Loanne Drilling his diabetes care provider, and his primary care for follow-up within the next 1-2 weeks.   Disposition: 01-Home or Self Care I have personally reviewed the patients medication history on the [place] controlled substance database.  Allergies as of 05/05/2016      Reactions   Bee Venom Anaphylaxis      Medication List    STOP taking these medications   ondansetron 4 MG tablet Commonly known as:  ZOFRAN     TAKE these medications   amoxicillin-clavulanate 875-125 MG tablet Commonly known as:  AUGMENTIN Take 1 tablet by mouth every 12 (twelve) hours.   BASAGLAR KWIKPEN 100 UNIT/ML Sopn Start with 25 units and record glucose at home.  Call your primary care and get folow up later this week for directions on your diabetes medicine. What changed:  how much to take  how to take this  when to take this  additional instructions   glucose blood test strip Use TID   hydrochlorothiazide 12.5 MG capsule Commonly known as:  MICROZIDE Take 1 capsule (12.5 mg total) by mouth daily. Start taking on:  05/06/2016   HYDROcodone-acetaminophen 5-325 MG tablet Commonly known as:  NORCO/VICODIN Take 1-2 tablets by mouth every 4 (four) hours as needed for moderate pain.   ibuprofen 200 MG tablet Commonly known as:  ADVIL,MOTRIN You can take 2 or 3 tablets every 6 hours as needed, do not take more than this it can hurt your kidneys. What changed:  how much to take  how to take this  when to take this  reasons to take this  additional instructions   Insulin Pen Needle 31G X 5 MM Misc Use 1 needle daily with insulin Dx  E11.9   lisinopril 10 MG tablet Commonly known as:  PRINIVIL,ZESTRIL Take 1 tablet (10 mg total) by mouth daily. Start taking on:  05/06/2016   [place] w/Device Kit 1 Act by Does not apply route 3 (three) times daily.   polyethylene glycol packet Commonly known as:  MIRALAX / GLYCOLAX You can buy this at any drug store and follow package instructions.  Use as needed for constipation.   potassium chloride SA 20 MEQ tablet Commonly known as:  K-DUR,KLOR-CON Take 1 tablet (20 mEq total) by mouth daily. Start taking on:  05/06/2016   saccharomyces boulardii 250 MG capsule Commonly known as:  FLORASTOR You can buy this at any drug store.  Follow directions on package and take for at least 2 weeks after your antibiotics are completed.      Follow-up Information  [facility] Follow up.   Why:  nurse to assist with drain care Contact information: [address] [place] [address] [phone]        Merrie Roof, MD Follow up on 05/14/2016.   Specialty:  General Surgery Why:  Your appointment is at 11:40 AM, be at the office at 11 AM for check in.  Psychologist, forensic ID. You should get a call from the Radiology department for follow up of your drain next 24-48 hours.  If you do not hear call our office, let them check. Contact information: [place] [address] [phone]        Scarlette Calico, MD Follow up.   Specialty:  Internal Medicine Why:  Call for an appointment some time this week if possible to get your diabetes checked along with your blood pressure. Contact information: [address] [place] [address] [phone]        Wallace Going, Wynona Luna, MD Follow up.   Specialty:  Interventional Radiology Why:  They should call you with follow up and repeat CT for your drain.   Contact information: [address] [place]  [address] [phone]           Signed: Earnstine Regal 05/05/2016, 3:58 PM

## 2016-05-06 ENCOUNTER — Other Ambulatory Visit: Payer: Self-pay | Admitting: Orthopaedic Surgery

## 2016-05-06 DIAGNOSIS — R188 Other ascites: Secondary | ICD-10-CM

## 2016-05-07 ENCOUNTER — Ambulatory Visit (INDEPENDENT_AMBULATORY_CARE_PROVIDER_SITE_OTHER): Payer: BLUE CROSS/BLUE SHIELD | Admitting: Endocrinology

## 2016-05-07 ENCOUNTER — Encounter: Payer: Self-pay | Admitting: Endocrinology

## 2016-05-07 VITALS — BP 134/86 | HR 104 | Ht 72.0 in | Wt 243.0 lb

## 2016-05-07 DIAGNOSIS — E139 Other specified diabetes mellitus without complications: Secondary | ICD-10-CM

## 2016-05-07 DIAGNOSIS — E109 Type 1 diabetes mellitus without complications: Secondary | ICD-10-CM

## 2016-05-07 LAB — AEROBIC/ANAEROBIC CULTURE (SURGICAL/DEEP WOUND): SPECIAL REQUESTS: NORMAL

## 2016-05-07 LAB — AEROBIC/ANAEROBIC CULTURE W GRAM STAIN (SURGICAL/DEEP WOUND): Culture: NO GROWTH

## 2016-05-07 MED ORDER — BASAGLAR KWIKPEN 100 UNIT/ML ~~LOC~~ SOPN: 110.0000 [IU] | PEN_INJECTOR | SUBCUTANEOUS | 11 refills | Status: DC

## 2016-05-07 NOTE — Progress Notes (Signed)
Subjective:    Patient ID: Joseph [name], male    DOB: 1980-10-20, 36 y.o.   MRN: [id number]  HPI Pt returns for f/u of diabetes mellitus: DM type: Insulin-requiring type 2 Dx'ed: 8721 Complications: none Therapy: insulin since soon after dx.   DKA: never Severe hypoglycemia: never Pancreatitis: never Other: he works 3rd shift; he takes QD insulin, after noncompliance with BID premixed; he works 3rd shift, in a hotel.   Interval history: He was recently in hospital for abd pain.  He had not been consistently taking insulin.  since hosp d/c, he takes 25 units 3-4 times per day.  no cbg record, but states cbg's are in the low-100's.  Past Medical History:  Diagnosis Date  . Diabetes mellitus without complication ([place])   . Hyperlipidemia     Past Surgical History:  Procedure Laterality Date  . APPENDECTOMY    . wisdom teeth pulled      Social History   Social History  . Marital status: Single    Spouse name: N/A  . Number of children: N/A  . Years of education: N/A   Occupational History  . Not on file.   Social History Main Topics  . Smoking status: Current Every Day Smoker    Last attempt to quit: 05/28/2013  . Smokeless tobacco: Current User  . Alcohol use No  . Drug use: No  . Sexual activity: Not Currently   Other Topics Concern  . Not on file   Social History Narrative  . No narrative on file    Current Outpatient Prescriptions on File Prior to Visit  Medication Sig Dispense Refill  . amoxicillin-clavulanate (AUGMENTIN) 875-125 MG tablet Take 1 tablet by mouth every 12 (twelve) hours. 14 tablet 0  . Blood Glucose Monitoring Suppl (ONETOUCH VERIO FLEX SYSTEM) w/Device KIT 1 Act by Does not apply route 3 (three) times daily. 2 kit 0  . glucose blood test strip Use TID 100 each 12  . hydrochlorothiazide (MICROZIDE) 12.5 MG capsule Take 1 capsule (12.5 mg total) by mouth daily. 30 capsule 0  . HYDROcodone-acetaminophen (NORCO/VICODIN) 5-325 MG tablet Take  1-2 tablets by mouth every 4 (four) hours as needed for moderate pain. 30 tablet 0  . ibuprofen (ADVIL,MOTRIN) 200 MG tablet You can take 2 or 3 tablets every 6 hours as needed, do not take more than this it can hurt your kidneys. 30 tablet 0  . Insulin Pen Needle 31G X 5 MM MISC Use 1 needle daily with insulin Dx E11.9 100 each 3  . lisinopril (PRINIVIL,ZESTRIL) 10 MG tablet Take 1 tablet (10 mg total) by mouth daily. 30 tablet 0  . polyethylene glycol (MIRALAX / GLYCOLAX) packet You can buy this at any drug store and follow package instructions.  Use as needed for constipation. 14 each 0  . potassium chloride SA (K-DUR,KLOR-CON) 20 MEQ tablet Take 1 tablet (20 mEq total) by mouth daily. 30 tablet 1  . saccharomyces boulardii (FLORASTOR) 250 MG capsule You can buy this at any drug store.  Follow directions on package and take for at least 2 weeks after your antibiotics are completed.     No current facility-administered medications on file prior to visit.     Allergies  Allergen Reactions  . Bee Venom Anaphylaxis    Family History  Problem Relation Age of Onset  . Diabetes Father   . Hypertension Maternal Grandmother   . Cancer Neg Hx   . Early death Neg Hx   .  Heart disease Neg Hx   . Hyperlipidemia Neg Hx   . Kidney disease Neg Hx   . Learning disabilities Neg Hx   . Stroke Neg Hx     BP 134/86   Pulse (!) 104   Ht 6' (1.829 m)   Wt 243 lb (110.2 kg)   SpO2 97%   BMI 32.96 kg/m    Review of Systems He denies hypoglycemia.      Objective:   Physical Exam VITAL SIGNS:  See vs page GENERAL: no distress Pulses: dorsalis pedis intact bilat.   MSK: no deformity of the feet.  CV: no leg edema.   Skin:  no ulcer on the feet, but the skin is dry.  normal temp on the feet.  There is patchy hypopigmentation on the feet.  Neuro: sensation is intact to touch on the feet.    Ext: There is bilateral onychomycosis of the toenails.   Lab Results  Component Value Date   HGBA1C  10.3 (H) 04/27/2016      Assessment & Plan:  Insulin-requiring type 2 DM: ongoing poor control.  Noncompliance with cbg recording and insulin, persistent  Patient is advised the following: Patient Instructions  check your blood sugar twice a day.  vary the time of day when you check, between before the 3 meals, and at bedtime.  also check if you have symptoms of your blood sugar being too high or too low.  please keep a record of the readings and bring it to your next appointment here (or you can bring the meter itself).  You can write it on any piece of paper.  please call us sooner if your blood sugar goes below 70, or if you have a lot of readings over 200. I have sent a prescription to your pharmacy, to increase the basaglar back to 110 units each morning To help you remember it, pt it next to something you use each day, such as your toothbrush.   Please come back for a follow-up appointment in 1 month.

## 2016-05-07 NOTE — Patient Instructions (Addendum)
check your blood sugar twice a day.  vary the time of day when you check, between before the 3 meals, and at bedtime.  also check if you have symptoms of your blood sugar being too high or too low.  please keep a record of the readings and bring it to your next appointment here (or you can bring the meter itself).  You can write it on any piece of paper.  please call us sooner if your blood sugar goes below 70, or if you have a lot of readings over 200. I have sent a prescription to your pharmacy, to increase the basaglar back to 110 units each morning To help you remember it, pt it next to something you use each day, such as your toothbrush.   Please come back for a follow-up appointment in 1 month.

## 2016-05-12 NOTE — Progress Notes (Deleted)
Subjective:    Patient ID: Joseph [name], male    DOB: 1980-05-30, 36 y.o.   MRN: [id number]  No chief complaint on file.   HPI:  Joseph [name] is a 36 y.o. male who  has a past medical history of Diabetes mellitus without complication ([place]) and Hyperlipidemia. and presents today for a follow up office visit.  Recently evaluated in the emergency department and admitted to the hospital with the chief complaint of sudden onset constant, severe, suprapubic pain beginning prior to arrival. He endorses radiating pain to his lower back that worsens with position changes. Physical exam with diffuse abdominal tenderness significantly worse in the suprapubic area and right lower quadrant with no hernia. CT scan of the abdomen/pelvis showed inflamed tubular blind ending appearing structure in the right lower quadrant measuring 14 mm in diameter suspicious for appendicitis possibly of an appendiceal remanent given patient's history of appendectomy; and small amount of free fluid in the right quadrant pelvis and right paracolic gutter. She was diagnosed with acute appendicitis and refer to surgery. He was initially tried on medical management with continued abdominal pain and poor oral intake. He underwent drain placement through interventional radiology. His nausea improved within 24 hours of drainage. His diet was slowly advanced. His blood pressure is poorly controlled blood pressures ranging from 159/101 and 161/124. No previous history of hypertension and placed on lisinopril and hydrochlorothiazide. His hemoglobin A1c was 10.3 with initial recommendations for sliding scale insulin and resume Lantus. He was advised to follow-up with primary care in 1-2 weeks to maintain diabetes and blood pressure control. He has a follow-up appointment with general surgery on 05/14/16.  Allergies  Allergen Reactions  . Bee Venom Anaphylaxis      Outpatient Medications Prior to Visit  Medication Sig Dispense Refill    . amoxicillin-clavulanate (AUGMENTIN) 875-125 MG tablet Take 1 tablet by mouth every 12 (twelve) hours. 14 tablet 0  . Blood Glucose Monitoring Suppl (ONETOUCH VERIO FLEX SYSTEM) w/Device KIT 1 Act by Does not apply route 3 (three) times daily. 2 kit 0  . glucose blood test strip Use TID 100 each 12  . hydrochlorothiazide (MICROZIDE) 12.5 MG capsule Take 1 capsule (12.5 mg total) by mouth daily. 30 capsule 0  . HYDROcodone-acetaminophen (NORCO/VICODIN) 5-325 MG tablet Take 1-2 tablets by mouth every 4 (four) hours as needed for moderate pain. 30 tablet 0  . ibuprofen (ADVIL,MOTRIN) 200 MG tablet You can take 2 or 3 tablets every 6 hours as needed, do not take more than this it can hurt your kidneys. 30 tablet 0  . Insulin Glargine (BASAGLAR KWIKPEN) 100 UNIT/ML SOPN Inject 1.1 mLs (110 Units total) into the skin every morning. And pen needles 2/day 45 mL 11  . Insulin Pen Needle 31G X 5 MM MISC Use 1 needle daily with insulin Dx E11.9 100 each 3  . lisinopril (PRINIVIL,ZESTRIL) 10 MG tablet Take 1 tablet (10 mg total) by mouth daily. 30 tablet 0  . polyethylene glycol (MIRALAX / GLYCOLAX) packet You can buy this at any drug store and follow package instructions.  Use as needed for constipation. 14 each 0  . potassium chloride SA (K-DUR,KLOR-CON) 20 MEQ tablet Take 1 tablet (20 mEq total) by mouth daily. 30 tablet 1  . saccharomyces boulardii (FLORASTOR) 250 MG capsule You can buy this at any drug store.  Follow directions on package and take for at least 2 weeks after your antibiotics are completed.     No facility-administered medications prior  to visit.       Past Surgical History:  Procedure Laterality Date  . APPENDECTOMY    . wisdom teeth pulled        Past Medical History:  Diagnosis Date  . Diabetes mellitus without complication ([place])   . Hyperlipidemia       Review of Systems    Objective:    There were no vitals taken for this visit. Nursing note and vital signs  reviewed.  Physical Exam     Assessment & Plan:   Problem List Items Addressed This Visit    None       I am having Mr. Crooke maintain his [place], glucose blood, Insulin Pen Needle, amoxicillin-clavulanate, hydrochlorothiazide, HYDROcodone-acetaminophen, ibuprofen, lisinopril, polyethylene glycol, potassium chloride SA, saccharomyces boulardii, and BASAGLAR KWIKPEN.   No orders of the defined types were placed in this encounter.    Follow-up: No Follow-up on file.  Mauricio Po, FNP

## 2016-05-13 ENCOUNTER — Ambulatory Visit: Payer: BLUE CROSS/BLUE SHIELD | Admitting: Family

## 2016-05-14 ENCOUNTER — Other Ambulatory Visit: Payer: Self-pay

## 2016-05-14 ENCOUNTER — Ambulatory Visit
Admission: RE | Admit: 2016-05-14 | Discharge: 2016-05-14 | Disposition: A | Discharge status: Home / Self Care | Payer: BLUE CROSS/BLUE SHIELD | Source: Ambulatory Visit | Attending: Orthopaedic Surgery | Admitting: Orthopaedic Surgery

## 2016-05-14 ENCOUNTER — Ambulatory Visit
Admission: RE | Admit: 2016-05-14 | Discharge: 2016-05-14 | Disposition: A | Discharge status: Home / Self Care | Payer: BLUE CROSS/BLUE SHIELD | Source: Ambulatory Visit | Attending: Radiology | Admitting: Radiology

## 2016-05-14 DIAGNOSIS — R188 Other ascites: Secondary | ICD-10-CM

## 2016-05-14 HISTORY — PX: IR RADIOLOGIST EVAL & MGMT: IMG5224

## 2016-05-14 MED ORDER — BASAGLAR KWIKPEN 100 UNIT/ML ~~LOC~~ SOPN: 110.0000 [IU] | PEN_INJECTOR | SUBCUTANEOUS | 2 refills | Status: DC

## 2016-05-14 MED ORDER — IOPAMIDOL (ISOVUE-300) INJECTION 61%
100.0000 mL | Freq: Once | INTRAVENOUS | Status: AC | PRN
  Administered 2016-05-14: 100 mL via INTRAVENOUS

## 2016-05-14 NOTE — Progress Notes (Signed)
Patient ID: Joseph [name], male   DOB: [birth year], 36 y.o.   MRN: [id number]   Referring Physician(s): Autumn Messing, MD  Chief Complaint: The patient is seen in follow up today s/p percutaneous drain placement on 05-02-16  History of present illness:  Mr. Radermacher is a 36 yo male who underwent a partial appendectomy on 02/04/16 in [place].  He was likely perforated at this time as they drained an abscess as well.  He was supposed to follow up for a definitive completion appendectomy, but never did.  He was admitted secondary to stump appendicitis on 04-27-16.  Conservative measures were attempted, but he then developed a pelvic abscess on repeat imaging.  IR was consulted and a drain was placed on 05-02-16.  He presents today for repeat CT scan with drain injection.  He is still flushing his drain, but with pressure.  He denies much output except the flush.  He denies fevers or abdominal pain.    Past Medical History:  Diagnosis Date  . Diabetes mellitus without complication ([place])   . Hyperlipidemia     Past Surgical History:  Procedure Laterality Date  . APPENDECTOMY    . wisdom teeth pulled      Allergies: Bee venom  Medications: Prior to Admission medications   Medication Sig Start Date End Date Taking? Authorizing Provider  amoxicillin-clavulanate (AUGMENTIN) 875-125 MG tablet Take 1 tablet by mouth every 12 (twelve) hours. 05/05/16   Earnstine Regal, PA-C  Blood Glucose Monitoring Suppl ([employer]) w/Device KIT 1 Act by Does not apply route 3 (three) times daily. 03/06/15   Janith Lima, MD  glucose blood test strip Use TID 03/06/15   Janith Lima, MD  hydrochlorothiazide (MICROZIDE) 12.5 MG capsule Take 1 capsule (12.5 mg total) by mouth daily. 05/06/16   Earnstine Regal, PA-C  HYDROcodone-acetaminophen (NORCO/VICODIN) 5-325 MG tablet Take 1-2 tablets by mouth every 4 (four) hours as needed for moderate pain. 05/05/16   Earnstine Regal, PA-C  ibuprofen  (ADVIL,MOTRIN) 200 MG tablet You can take 2 or 3 tablets every 6 hours as needed, do not take more than this it can hurt your kidneys. 05/05/16   Earnstine Regal, PA-C  Insulin Glargine (BASAGLAR KWIKPEN) 100 UNIT/ML SOPN Inject 1.1 mLs (110 Units total) into the skin every morning. And pen needles 2/day 05/14/16   Renato Shin, MD  Insulin Pen Needle 31G X 5 MM MISC Use 1 needle daily with insulin Dx E11.9 09/05/15   Janith Lima, MD  lisinopril (PRINIVIL,ZESTRIL) 10 MG tablet Take 1 tablet (10 mg total) by mouth daily. 05/06/16   Earnstine Regal, PA-C  polyethylene glycol [facility] / Floria Raveling) packet You can buy this at any drug store and follow package instructions.  Use as needed for constipation. 05/05/16   Earnstine Regal, PA-C  potassium chloride SA (K-DUR,KLOR-CON) 20 MEQ tablet Take 1 tablet (20 mEq total) by mouth daily. 05/06/16   Earnstine Regal, PA-C  saccharomyces boulardii (FLORASTOR) 250 MG capsule You can buy this at any drug store.  Follow directions on package and take for at least 2 weeks after your antibiotics are completed. 05/05/16   Earnstine Regal, PA-C     Family History  Problem Relation Age of Onset  . Diabetes Father   . Hypertension Maternal Grandmother   . Cancer Neg Hx   . Early death Neg Hx   . Heart disease Neg Hx   . Hyperlipidemia Neg Hx   . Kidney disease Neg Hx   .  Learning disabilities Neg Hx   . Stroke Neg Hx     Social History   Social History  . Marital status: Single    Spouse name: N/A  . Number of children: N/A  . Years of education: N/A   Social History Main Topics  . Smoking status: Current Every Day Smoker    Last attempt to quit: 05/28/2013  . Smokeless tobacco: Current User  . Alcohol use No  . Drug use: No  . Sexual activity: Not Currently   Other Topics Concern  . Not on file   Social History Narrative  . No narrative on file     Physical Exam Gen: NAD Abd:  Soft, NT except around the drain.  Drain with minimal serous  output present, 1-2cc.  Drain injection was attempted, but secondary to a collapses cavity there was pressure with injection.  Only a minimal amount of contrast was seen entering the cavity.  See report for further details.  His drain was pulled with no issues.  Site was covered with clean and dry gauze.    Imaging: No results found.  Labs:  CBC:  Recent Labs  05/01/16 0515 05/02/16 0449 05/03/16 0502 05/05/16 0850  WBC 11.1* 12.4* 9.0 5.8  HGB 13.7 12.5* 12.2* 12.8*  HCT 40.6 36.8* 35.3* 38.6*  PLT 293 306 324 412*    COAGS:  Recent Labs  05/02/16 1018  INR 1.00    BMP:  Recent Labs  04/30/16 0531 05/02/16 0449 05/03/16 0502 05/05/16 0850  NA 135 137 136 139  K 3.7 3.5 3.4* 4.0  CL 107 107 104 107  CO2 20* 21* 23 23  GLUCOSE 164* 153* 152* 141*  BUN '11 9 7 7  ' CALCIUM 8.1* 8.3* 8.1* 9.0  CREATININE 0.76 0.70 0.66 0.75  GFRNONAA >60 >60 >60 >60  GFRAA >60 >60 >60 >60    LIVER FUNCTION TESTS:  Recent Labs  12/29/15 1547 04/26/16 2214  BILITOT 0.5 1.0  AST 19 22  ALT 30 40  ALKPHOS 69 58  PROT 7.7 7.4  ALBUMIN 4.3 4.0    Assessment:  1. Stump appendicitis with pelvic abscess, s/p drainage on 05-02-16  The patient's repeat CT scan today shows resolution of his pelvic abscess.  Attempt at drain injection was made, but with significant back pressure when trying to flush.  After discussion with Dr. Earleen Newport, we decided not to further pursue this given how good his CT scan looked.  His drain was pulled with no complications.  He has an appointment with Dr. Marlou Starks today at 11:40am for further plans.  He is stable from our standpoint for return to work.  Signed: Henreitta Cea 05/14/2016, 11:11 AM   Please refer to Dr. Pasty Arch attestation of this note for management and plan.

## 2016-05-15 ENCOUNTER — Ambulatory Visit: Payer: BLUE CROSS/BLUE SHIELD | Admitting: Internal Medicine

## 2016-05-21 ENCOUNTER — Encounter: Payer: Self-pay | Admitting: Family Medicine

## 2016-05-21 ENCOUNTER — Ambulatory Visit (INDEPENDENT_AMBULATORY_CARE_PROVIDER_SITE_OTHER): Payer: BLUE CROSS/BLUE SHIELD | Admitting: Family Medicine

## 2016-05-21 VITALS — BP 110/80 | HR 115 | Temp 99.1°F | Wt 247.0 lb

## 2016-05-21 DIAGNOSIS — E139 Other specified diabetes mellitus without complications: Secondary | ICD-10-CM

## 2016-05-21 DIAGNOSIS — Z9889 Other specified postprocedural states: Secondary | ICD-10-CM

## 2016-05-21 DIAGNOSIS — R1031 Right lower quadrant pain: Secondary | ICD-10-CM

## 2016-05-21 DIAGNOSIS — K353 Acute appendicitis with localized peritonitis: Secondary | ICD-10-CM | POA: Diagnosis not present

## 2016-05-21 DIAGNOSIS — K3533 Acute appendicitis with perforation and localized peritonitis, with abscess: Secondary | ICD-10-CM

## 2016-05-21 DIAGNOSIS — E109 Type 1 diabetes mellitus without complications: Secondary | ICD-10-CM | POA: Diagnosis not present

## 2016-05-21 MED ORDER — HYDROCODONE-ACETAMINOPHEN 5-325 MG PO TABS: 1.0000 | ORAL_TABLET | Freq: Four times a day (QID) | ORAL | 0 refills | Status: DC | PRN

## 2016-05-21 NOTE — Progress Notes (Signed)
Subjective:     Patient ID: Joseph [name], male   DOB: 03/09/1980, 36 y.o.   MRN: [id number]  HPI Patient seen for acute visit with one-week history of some bilateral lower abdominal pain. His history is that he was up in [place] he states back in December and was diagnosed with perforated appendix.  Initially improved after antibiotics. He was then admitted here March 10th through the 19th with peritoneal abscess and eventually had drain placed which was taken out a week ago. He had follow-up CT scan March 28. He's been maintained currently Augmentin.  He particularly last night started noticing some diffuse bilateral lower abdominal pain which he states is 5 out of 10 intensity at it's worst. No nausea or vomiting. No stool changes. He had some night sweats which is not a new symptom but no definitive fever. He has follow-up with his surgeon he thinks on the 20th of this month. No dysuria  He has diabetes type 1.5 followed by endocrinology. He states his blood sugars have been relatively stable.  Past Medical History:  Diagnosis Date  . Diabetes mellitus without complication (HCC)   . Hyperlipidemia    Past Surgical History:  Procedure Laterality Date  . APPENDECTOMY    . wisdom teeth pulled      reports that he has been smoking.  He uses smokeless tobacco. He reports that he does not drink alcohol or use drugs. family history includes Diabetes in his father; Hypertension in his maternal grandmother. Allergies  Allergen Reactions  . Bee Venom Anaphylaxis     Review of Systems  Constitutional: Negative for chills and fever.  Respiratory: Negative for shortness of breath.   Cardiovascular: Negative for chest pain.  Gastrointestinal: Positive for abdominal pain. Negative for abdominal distention, blood in stool, diarrhea, nausea and vomiting.       Objective:   Physical Exam  Constitutional: He appears well-developed and well-nourished.  Cardiovascular: Normal rate and regular  rhythm.   Pulmonary/Chest: Breath sounds normal. No respiratory distress. He has no wheezes. He has no rales.  Abdominal: Soft. Bowel sounds are normal. He exhibits no distension and no mass. There is no rebound and no guarding.  Mild tenderness right and left lower quadrants right greater than left. No guarding or rebound. Abdomen is soft.       Assessment:     Patient has history of appendicitis with perforation and peritoneal abscess with recent drain as above currently maintained on Augmentin. One day history of some diffuse bilateral lower abdominal pains. No acute abdomen at this time and afebrile/nontoxic.    Plan:     -Recheck CBC with differential -Patient needs prompt follow-up with surgeon. -Wrote for limited Vicodin 5 mg #12 one to 2 every 6 hours for severe pain -He is encouraged to go to the ER immediately for any increased fever or worsening pain -may need repeat CT abdomen to reassess.  Kristian Covey MD [facility]

## 2016-05-21 NOTE — Patient Instructions (Signed)
Follow up immediately for any increased fever or worsening abdominal pain.

## 2016-05-21 NOTE — Progress Notes (Signed)
Pre visit review using our clinic review tool, if applicable. No additional management support is needed unless otherwise documented below in the visit note. 

## 2016-05-22 ENCOUNTER — Telehealth: Payer: Self-pay | Admitting: Family Medicine

## 2016-05-22 ENCOUNTER — Other Ambulatory Visit: Payer: Self-pay | Admitting: General Surgery

## 2016-05-22 ENCOUNTER — Other Ambulatory Visit: Payer: Self-pay

## 2016-05-22 DIAGNOSIS — K3533 Acute appendicitis with perforation and localized peritonitis, with abscess: Secondary | ICD-10-CM

## 2016-05-22 LAB — CBC WITH DIFFERENTIAL/PLATELET
BASOS PCT: 0.8 % (ref 0.0–3.0)
Basophils Absolute: 0.1 10*3/uL (ref 0.0–0.1)
EOS ABS: 0.1 10*3/uL (ref 0.0–0.7)
EOS PCT: 0.9 % (ref 0.0–5.0)
HEMATOCRIT: 43.3 % (ref 39.0–52.0)
HEMOGLOBIN: 14.2 g/dL (ref 13.0–17.0)
LYMPHS PCT: 20.5 % (ref 12.0–46.0)
Lymphs Abs: 2 10*3/uL (ref 0.7–4.0)
MCHC: 32.9 g/dL (ref 30.0–36.0)
MCV: 89.3 fl (ref 78.0–100.0)
MONO ABS: 0.9 10*3/uL (ref 0.1–1.0)
Monocytes Relative: 8.7 % (ref 3.0–12.0)
Neutro Abs: 6.9 10*3/uL (ref 1.4–7.7)
Neutrophils Relative %: 69.1 % (ref 43.0–77.0)
Platelets: 335 10*3/uL (ref 150.0–400.0)
RBC: 4.84 Mil/uL (ref 4.22–5.81)
RDW: 13.4 % (ref 11.5–15.5)
WBC: 9.9 10*3/uL (ref 4.0–10.5)

## 2016-05-22 NOTE — Telephone Encounter (Signed)
Pt is returning misty call °

## 2016-05-22 NOTE — Telephone Encounter (Signed)
Called and left a message for the pt to call back.  Needs follow up appt with his surgeon.  Need to find out the location/name of his surgeon.  Per Dr. Caryl Never, pt needs follow up today or tomorrow.  Waiting on return call.

## 2016-05-22 NOTE — Telephone Encounter (Signed)
Spoke with pt and advised him that he needs to see surgeon for follow up ASAP today or tomorrow. I let him know I would call [facility] and advise them that pt needs to be seen. Spoke with Toniann Fail @ [facility] and she is contacting pt directly to get him scheduled.

## 2016-05-23 ENCOUNTER — Other Ambulatory Visit: Payer: BLUE CROSS/BLUE SHIELD

## 2016-05-27 ENCOUNTER — Ambulatory Visit
Admission: RE | Admit: 2016-05-27 | Discharge: 2016-05-27 | Disposition: A | Discharge status: Home / Self Care | Payer: BLUE CROSS/BLUE SHIELD | Source: Ambulatory Visit | Attending: General Surgery | Admitting: General Surgery

## 2016-05-27 DIAGNOSIS — K3533 Acute appendicitis with perforation and localized peritonitis, with abscess: Secondary | ICD-10-CM

## 2016-05-27 MED ORDER — IOPAMIDOL (ISOVUE-300) INJECTION 61%
125.0000 mL | Freq: Once | INTRAVENOUS | Status: AC | PRN
  Administered 2016-05-27: 125 mL via INTRAVENOUS

## 2016-06-05 ENCOUNTER — Ambulatory Visit: Payer: Self-pay | Admitting: General Surgery

## 2016-06-06 ENCOUNTER — Ambulatory Visit: Payer: BLUE CROSS/BLUE SHIELD | Admitting: Endocrinology

## 2016-06-11 ENCOUNTER — Other Ambulatory Visit: Payer: Self-pay | Admitting: Endocrinology

## 2016-06-17 NOTE — Pre-Procedure Instructions (Addendum)
Knight Oelkers  06/17/2016      [id number] [place], [place] - [address] AT CORNER OF [facility] [address] [place] [address] Phone: [phone] Fax: [phone]  [id number] - [place], [place] - [address] [name] [address] Phone: [phone] Fax: [phone]    Your procedure is scheduled on May 9 at 830 AM.  Report to [facility] Admitting at 630 A.M.  Call this number if you have problems the morning of surgery:  [phone]   Remember:  Do not eat food or drink liquids after midnight.  Take these medicines the morning of surgery with A SIP OF WATER amoxicillin-clavulanate (augmentin), hydrocodone-acetaminophen (Norco)  7 days prior to surgery STOP taking any Aspirin, Aleve, Naproxen, Ibuprofen, Motrin, Advil, Goody's, BC's, all herbal medications, fish oil, and all vitamins   Do not wear jewelry, make-up or nail polish.  Do not wear lotions, powders, or perfumes, or deoderant.  Do not shave 48 hours prior to surgery.  Men may shave face and neck.  Do not bring valuables to the hospital.  [facility] is not responsible for any belongings or valuables.  Contacts, dentures or bridgework may not be worn into surgery.  Leave your suitcase in the car.  After surgery it may be brought to your room.  For patients admitted to the hospital, discharge time will be determined by your treatment team.  Patients discharged the day of surgery will not be allowed to drive home.    Special instructions:     How to Manage Your Diabetes Before and After Surgery  Why is it important to control my blood sugar before and after surgery? . Improving blood sugar levels before and after surgery helps healing and can limit problems. . A way of improving blood sugar control is eating a healthy diet by: o  Eating less sugar and carbohydrates o  Increasing activity/exercise o  Talking with  your doctor about reaching your blood sugar goals . High blood sugars (greater than 180 mg/dL) can raise your risk of infections and slow your recovery, so you will need to focus on controlling your diabetes during the weeks before surgery. . Make sure that the doctor who takes care of your diabetes knows about your planned surgery including the date and location.  How do I manage my blood sugar before surgery? . Check your blood sugar at least 4 times a day, starting 2 days before surgery, to make sure that the level is not too high or low. o Check your blood sugar the morning of your surgery when you wake up and every 2 hours until you get to the Short Stay unit. . If your blood sugar is less than 70 mg/dL, you will need to treat for low blood sugar: o Do not take insulin. o Treat a low blood sugar (less than 70 mg/dL) with  cup of clear juice (cranberry or apple), 4 glucose tablets, OR glucose gel. o Recheck blood sugar in 15 minutes after treatment (to make sure it is greater than 70 mg/dL). If your blood sugar is not greater than 70 mg/dL on recheck, call [phone] for further instructions. . Report your blood sugar to the short stay nurse when you get to Short Stay.  . If you are admitted to the hospital after surgery: o Your blood sugar will be checked by the staff and you will probably be given insulin after surgery (instead  of oral diabetes medicines) to make sure you have good blood sugar levels. o The goal for blood sugar control after surgery is 80-180 mg/dL.     WHAT DO I DO ABOUT MY DIABETES MEDICATION?   [name] Kitchen Do not take oral diabetes medicines (pills) the morning of surgery.  . THE NIGHT BEFORE SURGERY, take ___________ units of ___________insulin.       . THE MORNING OF SURGERY, take 55 units of insulin glargine.  . The day of surgery, do not take other diabetes injectables, including Byetta (exenatide), Bydureon (exenatide ER), Victoza (liraglutide), or Trulicity  (dulaglutide).  . If your CBG is greater than 220 mg/dL, you may take  of your sliding scale (correction) dose of insulin.  Reviewed and Endorsed by [facility] Patient Education Committee, August 2015  [facility]- Preparing For Surgery  Before surgery, you can play an important role. Because skin is not sterile, your skin needs to be as free of germs as possible. You can reduce the number of germs on your skin by washing with CHG (chlorahexidine gluconate) Soap before surgery.  CHG is an antiseptic cleaner which kills germs and bonds with the skin to continue killing germs even after washing.  Please do not use if you have an allergy to CHG or antibacterial soaps. If your skin becomes reddened/irritated stop using the CHG.  Do not shave (including legs and underarms) for at least 48 hours prior to first CHG shower. It is OK to shave your face.  Please follow these instructions carefully.   1. Shower the NIGHT BEFORE SURGERY and the MORNING OF SURGERY with CHG.   2. If you chose to wash your hair, wash your hair first as usual with your normal shampoo.  3. After you shampoo, rinse your hair and body thoroughly to remove the shampoo.  4. Use CHG as you would any other liquid soap. You can apply CHG directly to the skin and wash gently with a scrungie or a clean washcloth.   5. Apply the CHG Soap to your body ONLY FROM THE NECK DOWN.  Do not use on open wounds or open sores. Avoid contact with your eyes, ears, mouth and genitals (private parts). Wash genitals (private parts) with your normal soap.  6. Wash thoroughly, paying special attention to the area where your surgery will be performed.  7. Thoroughly rinse your body with warm water from the neck down.  8. DO NOT shower/wash with your normal soap after using and rinsing off the CHG Soap.  9. Pat yourself dry with a CLEAN TOWEL.   10. Wear CLEAN PAJAMAS   11. Place CLEAN SHEETS on your bed the night of your first shower and DO  NOT SLEEP WITH PETS.    Day of Surgery: Do not apply any deodorants/lotions. Please wear clean clothes to the hospital/surgery center.     Please read over the following fact sheets that you were given. Pain Booklet, Coughing and Deep Breathing and Surgical Site Infection Prevention

## 2016-06-18 ENCOUNTER — Inpatient Hospital Stay (HOSPITAL_COMMUNITY)
Admission: RE | Admit: 2016-06-18 | Discharge: 2016-06-18 | Disposition: A | Discharge status: Home / Self Care | Payer: BLUE CROSS/BLUE SHIELD | Source: Ambulatory Visit

## 2016-06-18 NOTE — Pre-Procedure Instructions (Signed)
Joseph [name]  06/18/2016      [id number] [name] - [address] AT [address] [place] [address] Phone: [phone] Fax: [phone]  [id number] - [place], [place] - [address] [name] [address] Phone: [phone] Fax: [phone]    Your procedure is scheduled on May 9 at 830 AM.  Report to [facility] Admitting at 630 A.M.  Call this number if you have problems the morning of surgery:  [phone]   Remember:  Do not eat food or drink liquids after midnight.  Take these medicines the morning of surgery with A SIP OF WATER amoxicillin-clavulanate (augmentin), hydrocodone-acetaminophen (Norco)  7 days prior to surgery STOP taking any Aspirin, Aleve, Naproxen, Ibuprofen, Motrin, Advil, Goody's, BC's, all herbal medications, fish oil, and all vitamins WHAT DO I DO ABOUT MY DIABETES MEDICATION?   Joseph [name] Kitchen Do not take oral diabetes medicines (pills) the morning of surgery.  . THE MORNING OF SURGERY, take 55 units of insulin glargine.  . The day of surgery, do not take other diabetes injectables, including Byetta (exenatide), Bydureon (exenatide ER), Victoza (liraglutide), or Trulicity (dulaglutide).  . If your CBG is greater than 220 mg/dL, you may take  of your sliding scale (correction) dose of insulin.  How to Manage Your Diabetes Before and After Surgery  Why is it important to control my blood sugar before and after surgery? . Improving blood sugar levels before and after surgery helps healing and can limit problems. . A way of improving blood sugar control is eating a healthy diet by: o  Eating less sugar and carbohydrates o  Increasing activity/exercise o  Talking with your doctor about reaching your blood sugar goals . High blood sugars (greater than 180 mg/dL) can raise your risk of infections and slow your recovery, so you will need to focus  on controlling your diabetes during the weeks before surgery. . Make sure that the doctor who takes care of your diabetes knows about your planned surgery including the date and location.  How do I manage my blood sugar before surgery? . Check your blood sugar at least 4 times a day, starting 2 days before surgery, to make sure that the level is not too high or low. o Check your blood sugar the morning of your surgery when you wake up and every 2 hours until you get to the Short Stay unit. . If your blood sugar is less than 70 mg/dL, you will need to treat for low blood sugar: o Do not take insulin. o Treat a low blood sugar (less than 70 mg/dL) with  cup of clear juice (cranberry or apple), 4 glucose tablets, OR glucose gel. o Recheck blood sugar in 15 minutes after treatment (to make sure it is greater than 70 mg/dL). If your blood sugar is not greater than 70 mg/dL on recheck, call [phone] for further instructions. . Report your blood sugar to the short stay nurse when you get to Short Stay.  . If you are admitted to the hospital after surgery: o Your blood sugar will be checked by the staff and you will probably be given insulin after surgery (instead of oral diabetes medicines) to make sure you have good blood sugar levels. o The goal for blood sugar control after surgery is 80-180 mg/dL.   Do not wear jewelry, make-up or nail polish.  Do not wear lotions, powders, or  perfumes, or deoderant.  Do not shave 48 hours prior to surgery.  Men may shave face and neck.  Do not bring valuables to the hospital.  [facility] is not responsible for any belongings or valuables.  Contacts, dentures or bridgework may not be worn into surgery.  Leave your suitcase in the car.  After surgery it may be brought to your room.  For patients admitted to the hospital, discharge time will be determined by your treatment team.  Patients discharged the day of surgery will not be allowed to drive home.       [name]- Preparing For Surgery  Before surgery, you can play an important role. Because skin is not sterile, your skin needs to be as free of germs as possible. You can reduce the number of germs on your skin by washing with CHG (chlorahexidine gluconate) Soap before surgery.  CHG is an antiseptic cleaner which kills germs and bonds with the skin to continue killing germs even after washing.  Please do not use if you have an allergy to CHG or antibacterial soaps. If your skin becomes reddened/irritated stop using the CHG.  Do not shave (including legs and underarms) for at least 48 hours prior to first CHG shower. It is OK to shave your face.  Please follow these instructions carefully.   1. Shower the NIGHT BEFORE SURGERY and the MORNING OF SURGERY with CHG.   2. If you chose to wash your hair, wash your hair first as usual with your normal shampoo.  3. After you shampoo, rinse your hair and body thoroughly to remove the shampoo.  4. Use CHG as you would any other liquid soap. You can apply CHG directly to the skin and wash gently with a scrungie or a clean washcloth.   5. Apply the CHG Soap to your body ONLY FROM THE NECK DOWN.  Do not use on open wounds or open sores. Avoid contact with your eyes, ears, mouth and genitals (private parts). Wash genitals (private parts) with your normal soap.  6. Wash thoroughly, paying special attention to the area where your surgery will be performed.  7. Thoroughly rinse your body with warm water from the neck down.  8. DO NOT shower/wash with your normal soap after using and rinsing off the CHG Soap.  9. Pat yourself dry with a CLEAN TOWEL.   10. Wear CLEAN PAJAMAS   11. Place CLEAN SHEETS on your bed the night of your first shower and DO NOT SLEEP WITH PETS.    Day of Surgery: Do not apply any deodorants/lotions. Please wear clean clothes to the hospital/surgery center.     Please read over the following fact sheets that you  were given. Pain Booklet, Coughing and Deep Breathing and Surgical Site Infection Prevention

## 2016-06-19 ENCOUNTER — Ambulatory Visit (INDEPENDENT_AMBULATORY_CARE_PROVIDER_SITE_OTHER): Payer: BLUE CROSS/BLUE SHIELD | Admitting: Endocrinology

## 2016-06-19 ENCOUNTER — Encounter: Payer: Self-pay | Admitting: Endocrinology

## 2016-06-19 VITALS — BP 140/88 | HR 94 | Ht 72.0 in | Wt 256.0 lb

## 2016-06-19 DIAGNOSIS — E109 Type 1 diabetes mellitus without complications: Secondary | ICD-10-CM

## 2016-06-19 DIAGNOSIS — E139 Other specified diabetes mellitus without complications: Secondary | ICD-10-CM

## 2016-06-19 MED ORDER — CLOTRIMAZOLE-BETAMETHASONE 1-0.05 % EX CREA: 1.0000 "application " | TOPICAL_CREAM | Freq: Two times a day (BID) | CUTANEOUS | 3 refills | Status: DC

## 2016-06-19 NOTE — Progress Notes (Signed)
Subjective:    Patient ID: Joseph [name], male    DOB: 1980/07/10, 36 y.o.   MRN: [id number]  HPI Pt returns for f/u of diabetes mellitus: DM type: Insulin-requiring type 2 Dx'ed: 6256 Complications: none Therapy: insulin since soon after dx.   DKA: never Severe hypoglycemia: never.  Pancreatitis: never Other: he works 3rd shift; he takes QD insulin, after noncompliance with BID premixed; he works 3rd shift, in a hotel.  Interval history: Pt says he misses the insulin approx twice per month.  no cbg record, but states cbg's are in the mid-100's.  pt states he feels well in general.   Past Medical History:  Diagnosis Date  . Diabetes mellitus without complication (HCC)    insulin treated type 2  . GERD (gastroesophageal reflux disease)   . Hyperlipidemia   . Hypertension     Past Surgical History:  Procedure Laterality Date  . APPENDECTOMY     attempted removal, too much swelling  . wisdom teeth pulled      Social History   Social History  . Marital status: Single    Spouse name: N/A  . Number of children: N/A  . Years of education: N/A   Occupational History  . Not on file.   Social History Main Topics  . Smoking status: Former Smoker    Quit date: 05/28/2013  . Smokeless tobacco: Current User     Comment: hasnt smoked since march  . Alcohol use No  . Drug use: No  . Sexual activity: Not Currently   Other Topics Concern  . Not on file   Social History Narrative  . No narrative on file    Current Outpatient Prescriptions on File Prior to Visit  Medication Sig Dispense Refill  . amoxicillin-clavulanate (AUGMENTIN) 875-125 MG tablet Take 1 tablet by mouth every 12 (twelve) hours. (Patient taking differently: Take 1 tablet by mouth every 12 (twelve) hours. Will continue until procedure) 14 tablet 0  . Blood Glucose Monitoring Suppl (ONETOUCH VERIO FLEX SYSTEM) w/Device KIT 1 Act by Does not apply route 3 (three) times daily. 2 kit 0  . glucose blood test  strip Use TID 100 each 12  . hydrochlorothiazide (MICROZIDE) 12.5 MG capsule Take 1 capsule (12.5 mg total) by mouth daily. (Patient taking differently: Take 12.5 mg by mouth daily. Takes around 1430) 30 capsule 0  . HYDROcodone-acetaminophen (NORCO/VICODIN) 5-325 MG tablet Take 1-2 tablets by mouth every 6 (six) hours as needed for moderate pain. (Patient taking differently: Take 1-2 tablets by mouth every 6 (six) hours as needed for moderate pain (depends on pain if takes 1-2 tablets). ) 12 tablet 0  . ibuprofen (ADVIL,MOTRIN) 200 MG tablet You can take 2 or 3 tablets every 6 hours as needed, do not take more than this it can hurt your kidneys. (Patient taking differently: Take 400 mg by mouth every 8 (eight) hours as needed for moderate pain. You can take 2 or 3 tablets every 6 hours as needed, do not take more than this it can hurt your kidneys.) 30 tablet 0  . Insulin Glargine (BASAGLAR KWIKPEN) 100 UNIT/ML SOPN Inject 1.1 mLs (110 Units total) into the skin every morning. And pen needles 2/day 40 pen 2  . Insulin Pen Needle 31G X 5 MM MISC Use 1 needle daily with insulin Dx E11.9 100 each 3  . lisinopril (PRINIVIL,ZESTRIL) 10 MG tablet TAKE 1 TABLET BY MOUTH EVERY DAY (Patient taking differently: TAKE 1 TABLET BY MOUTH EVERY DAY AROUND  1430 DAILY) 30 tablet 0  . polyethylene glycol (MIRALAX / GLYCOLAX) packet You can buy this at any drug store and follow package instructions.  Use as needed for constipation. 14 each 0  . potassium chloride SA (K-DUR,KLOR-CON) 20 MEQ tablet Take 1 tablet (20 mEq total) by mouth daily. 30 tablet 1  . saccharomyces boulardii (FLORASTOR) 250 MG capsule You can buy this at any drug store.  Follow directions on package and take for at least 2 weeks after your antibiotics are completed.     No current facility-administered medications on file prior to visit.     Allergies  Allergen Reactions  . Bee Venom Anaphylaxis    Family History  Problem Relation Age of  Onset  . Diabetes Father   . Hypertension Maternal Grandmother   . Cancer Neg Hx   . Early death Neg Hx   . Heart disease Neg Hx   . Hyperlipidemia Neg Hx   . Kidney disease Neg Hx   . Learning disabilities Neg Hx   . Stroke Neg Hx     BP 140/88   Pulse 94   Ht 6' (1.829 m)   Wt 256 lb (116.1 kg)   SpO2 96%   BMI 34.72 kg/m   Review of Systems He denies hypoglycemia    Objective:   Physical Exam VITAL SIGNS:  See vs page GENERAL: no distress Pulses: dorsalis pedis intact bilat.   MSK: no deformity of the feet.  CV: no leg edema.   Skin:  no ulcer on the feet, but the skin is dry, and there is peeling of the skin.  normal temp on the feet.  There is patchy hypopigmentation on the feet.  Neuro: sensation is intact to touch on the feet.    Ext: There is bilateral onychomycosis of the toenails.       Assessment & Plan:  Insulin-requiring type 2 DM: uncertain control.  He declines to check fructosamine.   Noncompliance with cbg recording and insulin, persistent.   Patient Instructions  check your blood sugar twice a day.  vary the time of day when you check, between before the 3 meals, and at bedtime.  also check if you have symptoms of your blood sugar being too high or too low.  please keep a record of the readings and bring it to your next appointment here (or you can bring the meter itself).  You can write it on any piece of paper.  please call us sooner if your blood sugar goes below 70, or if you have a lot of readings over 200. Please continue the same insulin. To help you remember it, pt it next to something you use each day, such as your toothbrush.   I have sent a prescription to your pharmacy, for the feet.  If the rash persists, please see Dr Ronnald Ramp about it.  Please come back for a follow-up appointment in 2 months.

## 2016-06-19 NOTE — Patient Instructions (Addendum)
check your blood sugar twice a day.  vary the time of day when you check, between before the 3 meals, and at bedtime.  also check if you have symptoms of your blood sugar being too high or too low.  please keep a record of the readings and bring it to your next appointment here (or you can bring the meter itself).  You can write it on any piece of paper.  please call us sooner if your blood sugar goes below 70, or if you have a lot of readings over 200. Please continue the same insulin. To help you remember it, pt it next to something you use each day, such as your toothbrush.   I have sent a prescription to your pharmacy, for the feet.  If the rash persists, please see Dr Yetta BarreJones about it.  Please come back for a follow-up appointment in 2 months.

## 2016-06-20 ENCOUNTER — Encounter (HOSPITAL_COMMUNITY): Payer: Self-pay

## 2016-06-20 ENCOUNTER — Encounter (HOSPITAL_COMMUNITY)
Admission: RE | Admit: 2016-06-20 | Discharge: 2016-06-20 | Disposition: A | Discharge status: Home / Self Care | Payer: BLUE CROSS/BLUE SHIELD | Source: Ambulatory Visit | Attending: General Surgery | Admitting: General Surgery

## 2016-06-20 DIAGNOSIS — Z01818 Encounter for other preprocedural examination: Secondary | ICD-10-CM | POA: Diagnosis not present

## 2016-06-20 DIAGNOSIS — K219 Gastro-esophageal reflux disease without esophagitis: Secondary | ICD-10-CM | POA: Diagnosis not present

## 2016-06-20 DIAGNOSIS — E119 Type 2 diabetes mellitus without complications: Secondary | ICD-10-CM | POA: Diagnosis not present

## 2016-06-20 DIAGNOSIS — K37 Unspecified appendicitis: Secondary | ICD-10-CM | POA: Diagnosis not present

## 2016-06-20 DIAGNOSIS — Z79899 Other long term (current) drug therapy: Secondary | ICD-10-CM | POA: Diagnosis not present

## 2016-06-20 DIAGNOSIS — Z87891 Personal history of nicotine dependence: Secondary | ICD-10-CM | POA: Diagnosis not present

## 2016-06-20 HISTORY — DX: Gastro-esophageal reflux disease without esophagitis: K21.9

## 2016-06-20 HISTORY — DX: Essential (primary) hypertension: I10

## 2016-06-20 LAB — CBC
HCT: 40.7 % (ref 39.0–52.0)
Hemoglobin: 13.6 g/dL (ref 13.0–17.0)
MCH: 29.4 pg (ref 26.0–34.0)
MCHC: 33.4 g/dL (ref 30.0–36.0)
MCV: 87.9 fL (ref 78.0–100.0)
Platelets: 250 10*3/uL (ref 150–400)
RBC: 4.63 MIL/uL (ref 4.22–5.81)
RDW: 13.2 % (ref 11.5–15.5)
WBC: 5.6 10*3/uL (ref 4.0–10.5)

## 2016-06-20 LAB — BASIC METABOLIC PANEL
Anion gap: 9 (ref 5–15)
BUN: 14 mg/dL (ref 6–20)
CALCIUM: 9.2 mg/dL (ref 8.9–10.3)
CO2: 23 mmol/L (ref 22–32)
CREATININE: 0.77 mg/dL (ref 0.61–1.24)
Chloride: 106 mmol/L (ref 101–111)
GFR calc Af Amer: >60 mL/min (ref >60)
GLUCOSE: 187 mg/dL — AB (ref 65–99)
Potassium: 4.2 mmol/L (ref 3.5–5.1)
Sodium: 138 mmol/L (ref 135–145)

## 2016-06-20 LAB — GLUCOSE, CAPILLARY: GLUCOSE-CAPILLARY: 193 mg/dL — AB (ref 65–99)

## 2016-06-20 MED ORDER — CHLORHEXIDINE GLUCONATE CLOTH 2 % EX PADS: 6.0000 | MEDICATED_PAD | Freq: Once | CUTANEOUS | Status: DC

## 2016-06-20 NOTE — Progress Notes (Signed)
PCP - Sanda Lingerhomas Jones Cardiologist - none, denies cardiac hx or workup Endocrinologist- Dr. Everardo AllEllison, seen 06/19/16 and no changes made to insulin per patient.   EKG - pt denies EKG in past, done 06/20/2016   Fasting Blood Sugar - CBG 193 today. Pt states his normal CBGs are in the 150s Checks Blood Sugar 2x times a day Last A1c 04/25/16 10.3, chart forwarded to anesthesia for review.   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 Pt sleepy at PAT appointment, nodded off during PAT interview. Information reviewed with patient. Pt told to review information again at home. Pt verbalized understanding.

## 2016-06-23 NOTE — Progress Notes (Signed)
Anesthesia Chart Review: Patient is a 36 year old male scheduled for laparoscopic appendectomy, possible open on 06/25/16 by Dr. Carolynne Edouardoth.   History includes former smoker (quit '15), HLD, HTN, GERD, DM2 (on insulin). He was admitted 04/26/16-05/05/16 for acute appendicitis with perforation and peritoneal abscess s/p perc drain 05/02/16. He had been admitted to a hospital in [place] for appendicitis 01/2016 and underwent what sounds like I&D of an abscess (he also reported "partial appendectomy"). He was to follow-up for completion appendectomy, but had not done this by the time he re-presented pelvis abscess.    PCP is Dr. Sanda Lingerhomas Jones. Endocrinologist is Dr. Romero BellingSean Ellison, last visit 06/19/16. He kept patient on same insulin dose.   BP 131/76   Pulse 92   Temp 36.3 C   Resp 20   Ht 6' (1.829 m)   SpO2 99%   BMI 34.72 kg/m   Meds include Augmentin, HCTZ, Norco, insulin glargine, lisinopril, KCl.   EKG 06/20/16: NSR.  Preoperative labs noted. CBC WNL. Cr 0.77. Glucose 187. A1c on 04/27/16 was 10.3, consistent with average glucose of 249 (A1c routed to Dr. Carolynne Edouardoth). He reported fasting CBG on 193 on 06/20/16, but reported typically in the 150's. He will get a fasting CBG on arrival. If result acceptable and otherwise no acute changes then I would anticipate that she could proceed as planned.  Velna Ochsllison Harlym Gehling, PA-C [facility]/Anesthesiology Phone [phone] 06/23/2016 2:32 PM

## 2016-06-25 ENCOUNTER — Other Ambulatory Visit: Payer: Self-pay | Admitting: Endocrinology

## 2016-06-25 ENCOUNTER — Ambulatory Visit (HOSPITAL_COMMUNITY)
Admission: RE | Admit: 2016-06-25 | Discharge: 2016-06-26 | Disposition: A | Discharge status: Home / Self Care | Payer: BLUE CROSS/BLUE SHIELD | Source: Ambulatory Visit | Attending: General Surgery | Admitting: General Surgery

## 2016-06-25 ENCOUNTER — Ambulatory Visit (HOSPITAL_COMMUNITY): Payer: BLUE CROSS/BLUE SHIELD | Admitting: Vascular Surgery

## 2016-06-25 ENCOUNTER — Encounter (HOSPITAL_COMMUNITY): Payer: Self-pay | Admitting: *Deleted

## 2016-06-25 ENCOUNTER — Ambulatory Visit (HOSPITAL_COMMUNITY): Payer: BLUE CROSS/BLUE SHIELD | Admitting: Anesthesiology

## 2016-06-25 ENCOUNTER — Encounter (HOSPITAL_COMMUNITY)
Admission: RE | Disposition: A | Discharge status: Home / Self Care | Payer: Self-pay | Source: Ambulatory Visit | Attending: General Surgery

## 2016-06-25 DIAGNOSIS — K219 Gastro-esophageal reflux disease without esophagitis: Secondary | ICD-10-CM | POA: Insufficient documentation

## 2016-06-25 DIAGNOSIS — K37 Unspecified appendicitis: Secondary | ICD-10-CM | POA: Diagnosis present

## 2016-06-25 DIAGNOSIS — Z87891 Personal history of nicotine dependence: Secondary | ICD-10-CM | POA: Diagnosis not present

## 2016-06-25 DIAGNOSIS — E119 Type 2 diabetes mellitus without complications: Secondary | ICD-10-CM | POA: Insufficient documentation

## 2016-06-25 DIAGNOSIS — K353 Acute appendicitis with localized peritonitis: Secondary | ICD-10-CM | POA: Diagnosis present

## 2016-06-25 DIAGNOSIS — Z79899 Other long term (current) drug therapy: Secondary | ICD-10-CM | POA: Diagnosis not present

## 2016-06-25 HISTORY — PX: LAPAROSCOPIC APPENDECTOMY: SHX408

## 2016-06-25 LAB — GLUCOSE, CAPILLARY
GLUCOSE-CAPILLARY: 191 mg/dL — AB (ref 65–99)
Glucose-Capillary: 125 mg/dL — ABNORMAL HIGH (ref 65–99)
Glucose-Capillary: 123 mg/dL — ABNORMAL HIGH (ref 65–99)
Glucose-Capillary: 212 mg/dL — ABNORMAL HIGH (ref 65–99)

## 2016-06-25 SURGERY — APPENDECTOMY, LAPAROSCOPIC
Anesthesia: General

## 2016-06-25 MED ORDER — LISINOPRIL 10 MG PO TABS
10.0000 mg | ORAL_TABLET | Freq: Every day | ORAL | Status: DC
  Administered 2016-06-25 – 2016-06-26 (×2): 10 mg via ORAL
  Filled 2016-06-25 (×2): qty 1

## 2016-06-25 MED ORDER — DEXTROSE 5 % IV SOLN
2.0000 g | Freq: Two times a day (BID) | INTRAVENOUS | Status: DC
  Administered 2016-06-25 – 2016-06-26 (×2): 2 g via INTRAVENOUS
  Filled 2016-06-25 (×2): qty 2

## 2016-06-25 MED ORDER — ONDANSETRON HCL 4 MG/2ML IJ SOLN
INTRAMUSCULAR | Status: AC
  Filled 2016-06-25: qty 2

## 2016-06-25 MED ORDER — FENTANYL CITRATE (PF) 250 MCG/5ML IJ SOLN
INTRAMUSCULAR | Status: AC
  Filled 2016-06-25: qty 5

## 2016-06-25 MED ORDER — HYDROMORPHONE HCL 1 MG/ML IJ SOLN: 0.2500 mg | INTRAMUSCULAR | Status: DC | PRN

## 2016-06-25 MED ORDER — OXYCODONE HCL 5 MG PO TABS: 5.0000 mg | ORAL_TABLET | Freq: Once | ORAL | Status: DC | PRN

## 2016-06-25 MED ORDER — SUGAMMADEX SODIUM 200 MG/2ML IV SOLN
INTRAVENOUS | Status: AC
  Filled 2016-06-25: qty 2

## 2016-06-25 MED ORDER — BUPIVACAINE HCL (PF) 0.25 % IJ SOLN
INTRAMUSCULAR | Status: AC
  Filled 2016-06-25: qty 30

## 2016-06-25 MED ORDER — ROCURONIUM BROMIDE 100 MG/10ML IV SOLN
INTRAVENOUS | Status: DC | PRN
  Administered 2016-06-25: 100 mg via INTRAVENOUS

## 2016-06-25 MED ORDER — CEFOTETAN DISODIUM-DEXTROSE 2-2.08 GM-% IV SOLR
INTRAVENOUS | Status: AC
  Filled 2016-06-25: qty 50

## 2016-06-25 MED ORDER — LIDOCAINE HCL (CARDIAC) 20 MG/ML IV SOLN
INTRAVENOUS | Status: DC | PRN
  Administered 2016-06-25 (×2): 100 mg via INTRAVENOUS

## 2016-06-25 MED ORDER — VECURONIUM BROMIDE 10 MG IV SOLR
INTRAVENOUS | Status: AC
  Filled 2016-06-25: qty 10

## 2016-06-25 MED ORDER — SODIUM CHLORIDE 0.9 % IR SOLN
Status: DC | PRN
  Administered 2016-06-25: 1000 mL

## 2016-06-25 MED ORDER — MIDAZOLAM HCL 2 MG/2ML IJ SOLN
INTRAMUSCULAR | Status: AC
  Filled 2016-06-25: qty 2

## 2016-06-25 MED ORDER — SUGAMMADEX SODIUM 200 MG/2ML IV SOLN
INTRAVENOUS | Status: DC | PRN
  Administered 2016-06-25: 200 mg via INTRAVENOUS

## 2016-06-25 MED ORDER — CEFOTETAN DISODIUM-DEXTROSE 2-2.08 GM-% IV SOLR
2.0000 g | INTRAVENOUS | Status: AC
  Administered 2016-06-25: 2 g via INTRAVENOUS

## 2016-06-25 MED ORDER — DEXAMETHASONE SODIUM PHOSPHATE 10 MG/ML IJ SOLN
INTRAMUSCULAR | Status: AC
  Filled 2016-06-25: qty 1

## 2016-06-25 MED ORDER — HYDROCHLOROTHIAZIDE 12.5 MG PO CAPS
12.5000 mg | ORAL_CAPSULE | Freq: Every day | ORAL | Status: DC
  Administered 2016-06-25 – 2016-06-26 (×2): 12.5 mg via ORAL
  Filled 2016-06-25 (×2): qty 1

## 2016-06-25 MED ORDER — KCL IN DEXTROSE-NACL 20-5-0.9 MEQ/L-%-% IV SOLN
INTRAVENOUS | Status: DC
  Administered 2016-06-25 – 2016-06-26 (×2): via INTRAVENOUS
  Filled 2016-06-25 (×2): qty 1000

## 2016-06-25 MED ORDER — MIDAZOLAM HCL 5 MG/5ML IJ SOLN
INTRAMUSCULAR | Status: DC | PRN
  Administered 2016-06-25: 2 mg via INTRAVENOUS

## 2016-06-25 MED ORDER — ONDANSETRON HCL 4 MG/2ML IJ SOLN
INTRAMUSCULAR | Status: DC | PRN
  Administered 2016-06-25: 4 mg via INTRAVENOUS

## 2016-06-25 MED ORDER — PANTOPRAZOLE SODIUM 40 MG IV SOLR
40.0000 mg | Freq: Every day | INTRAVENOUS | Status: DC
  Administered 2016-06-25: 40 mg via INTRAVENOUS
  Filled 2016-06-25: qty 40

## 2016-06-25 MED ORDER — ONDANSETRON HCL 4 MG/2ML IJ SOLN: 4.0000 mg | Freq: Four times a day (QID) | INTRAMUSCULAR | Status: DC | PRN

## 2016-06-25 MED ORDER — BUPIVACAINE-EPINEPHRINE 0.25% -1:200000 IJ SOLN
INTRAMUSCULAR | Status: DC | PRN
Start: 2016-06-25 — End: 2016-06-25
  Administered 2016-06-25: 19 mL

## 2016-06-25 MED ORDER — DEXAMETHASONE SODIUM PHOSPHATE 10 MG/ML IJ SOLN
INTRAMUSCULAR | Status: DC | PRN
  Administered 2016-06-25: 10 mg via INTRAVENOUS

## 2016-06-25 MED ORDER — LIDOCAINE 2% (20 MG/ML) 5 ML SYRINGE
INTRAMUSCULAR | Status: AC
  Filled 2016-06-25: qty 5

## 2016-06-25 MED ORDER — DEXTROSE 5 % IV SOLN
INTRAVENOUS | Status: DC | PRN
  Administered 2016-06-25: 09:00:00 via INTRAVENOUS

## 2016-06-25 MED ORDER — ARTIFICIAL TEARS OPHTHALMIC OINT
TOPICAL_OINTMENT | OPHTHALMIC | Status: DC | PRN
  Administered 2016-06-25: 1 via OPHTHALMIC

## 2016-06-25 MED ORDER — 0.9 % SODIUM CHLORIDE (POUR BTL) OPTIME
TOPICAL | Status: DC | PRN
  Administered 2016-06-25: 1000 mL

## 2016-06-25 MED ORDER — PROPOFOL 10 MG/ML IV BOLUS
INTRAVENOUS | Status: DC | PRN
  Administered 2016-06-25: 200 mg via INTRAVENOUS

## 2016-06-25 MED ORDER — INSULIN ASPART 100 UNIT/ML ~~LOC~~ SOLN
0.0000 [IU] | Freq: Three times a day (TID) | SUBCUTANEOUS | Status: DC
Start: 2016-06-25 — End: 2016-06-26
  Administered 2016-06-25: 3 [IU] via SUBCUTANEOUS
  Administered 2016-06-26: 1 [IU] via SUBCUTANEOUS

## 2016-06-25 MED ORDER — ONDANSETRON 4 MG PO TBDP: 4.0000 mg | ORAL_TABLET | Freq: Four times a day (QID) | ORAL | Status: DC | PRN

## 2016-06-25 MED ORDER — ROCURONIUM BROMIDE 10 MG/ML (PF) SYRINGE
PREFILLED_SYRINGE | INTRAVENOUS | Status: AC
  Filled 2016-06-25: qty 5

## 2016-06-25 MED ORDER — METOCLOPRAMIDE HCL 5 MG/ML IJ SOLN
INTRAMUSCULAR | Status: AC
  Filled 2016-06-25: qty 2

## 2016-06-25 MED ORDER — OXYCODONE HCL 5 MG/5ML PO SOLN: 5.0000 mg | Freq: Once | ORAL | Status: DC | PRN

## 2016-06-25 MED ORDER — LACTATED RINGERS IV SOLN
INTRAVENOUS | Status: DC | PRN
  Administered 2016-06-25 (×2): via INTRAVENOUS

## 2016-06-25 MED ORDER — HYDROCODONE-ACETAMINOPHEN 5-325 MG PO TABS
1.0000 | ORAL_TABLET | Freq: Four times a day (QID) | ORAL | Status: DC | PRN
  Administered 2016-06-25 – 2016-06-26 (×2): 2 via ORAL
  Filled 2016-06-25 (×2): qty 2

## 2016-06-25 MED ORDER — HEPARIN SODIUM (PORCINE) 5000 UNIT/ML IJ SOLN
5000.0000 [IU] | Freq: Three times a day (TID) | INTRAMUSCULAR | Status: DC
  Administered 2016-06-26: 5000 [IU] via SUBCUTANEOUS
  Filled 2016-06-25: qty 1

## 2016-06-25 MED ORDER — EPINEPHRINE PF 1 MG/ML IJ SOLN
INTRAMUSCULAR | Status: AC
  Filled 2016-06-25: qty 1

## 2016-06-25 MED ORDER — FENTANYL CITRATE (PF) 100 MCG/2ML IJ SOLN
INTRAMUSCULAR | Status: DC | PRN
  Administered 2016-06-25 (×3): 100 ug via INTRAVENOUS
  Administered 2016-06-25: 50 ug via INTRAVENOUS
  Administered 2016-06-25: 150 ug via INTRAVENOUS

## 2016-06-25 MED ORDER — MORPHINE SULFATE (PF) 4 MG/ML IV SOLN
1.0000 mg | INTRAVENOUS | Status: DC | PRN
  Administered 2016-06-25 (×2): 4 mg via INTRAVENOUS
  Filled 2016-06-25 (×2): qty 1

## 2016-06-25 MED ORDER — METOCLOPRAMIDE HCL 5 MG/ML IJ SOLN
INTRAMUSCULAR | Status: DC | PRN
  Administered 2016-06-25: 10 mg via INTRAVENOUS

## 2016-06-25 MED ORDER — ARTIFICIAL TEARS OPHTHALMIC OINT
TOPICAL_OINTMENT | OPHTHALMIC | Status: AC
  Filled 2016-06-25: qty 3.5

## 2016-06-25 SURGICAL SUPPLY — 38 items
ADH SKN CLS APL DERMABOND .7 (GAUZE/BANDAGES/DRESSINGS) ×1
APPLIER CLIP ROT 10 11.4 M/L (STAPLE)
APR CLP MED LRG 11.4X10 (STAPLE)
BAG SPEC RTRVL LRG 6X4 10 (ENDOMECHANICALS) ×1
BLADE CLIPPER SURG (BLADE) IMPLANT
CANISTER SUCT 3000ML PPV (MISCELLANEOUS) ×3 IMPLANT
CHLORAPREP W/TINT 26ML (MISCELLANEOUS) ×3 IMPLANT
CLIP APPLIE ROT 10 11.4 M/L (STAPLE) IMPLANT
COVER SURGICAL LIGHT HANDLE (MISCELLANEOUS) ×3 IMPLANT
CUTTER FLEX LINEAR 45M (STAPLE) ×3 IMPLANT
DERMABOND ADVANCED (GAUZE/BANDAGES/DRESSINGS) ×2
DERMABOND ADVANCED .7 DNX12 (GAUZE/BANDAGES/DRESSINGS) ×1 IMPLANT
ELECT REM PT RETURN 9FT ADLT (ELECTROSURGICAL) ×3
ELECTRODE REM PT RTRN 9FT ADLT (ELECTROSURGICAL) ×1 IMPLANT
ENDOLOOP SUT PDS II  0 18 (SUTURE)
ENDOLOOP SUT PDS II 0 18 (SUTURE) IMPLANT
GLOVE BIO SURGEON STRL SZ7.5 (GLOVE) ×3 IMPLANT
GLOVE SURG SS PI 8.0 STRL IVOR (GLOVE) ×2 IMPLANT
GOWN STRL REUS W/ TWL LRG LVL3 (GOWN DISPOSABLE) ×3 IMPLANT
GOWN STRL REUS W/TWL LRG LVL3 (GOWN DISPOSABLE) ×9
KIT BASIN OR (CUSTOM PROCEDURE TRAY) ×3 IMPLANT
KIT ROOM TURNOVER OR (KITS) ×3 IMPLANT
NS IRRIG 1000ML POUR BTL (IV SOLUTION) ×3 IMPLANT
PAD ARMBOARD 7.5X6 YLW CONV (MISCELLANEOUS) ×6 IMPLANT
POUCH SPECIMEN RETRIEVAL 10MM (ENDOMECHANICALS) ×3 IMPLANT
RELOAD STAPLE 45 3.5 BLU ETS (ENDOMECHANICALS) ×1 IMPLANT
RELOAD STAPLE TA45 3.5 REG BLU (ENDOMECHANICALS) ×6 IMPLANT
SET IRRIG TUBING LAPAROSCOPIC (IRRIGATION / IRRIGATOR) ×3 IMPLANT
SHEARS HARMONIC ACE PLUS 36CM (ENDOMECHANICALS) ×3 IMPLANT
SPECIMEN JAR SMALL (MISCELLANEOUS) ×3 IMPLANT
SUT MNCRL AB 4-0 PS2 18 (SUTURE) ×3 IMPLANT
TOWEL OR 17X24 6PK STRL BLUE (TOWEL DISPOSABLE) ×3 IMPLANT
TOWEL OR 17X26 10 PK STRL BLUE (TOWEL DISPOSABLE) ×3 IMPLANT
TRAY FOLEY CATH SILVER 16FR (SET/KITS/TRAYS/PACK) ×3 IMPLANT
TRAY LAPAROSCOPIC MC (CUSTOM PROCEDURE TRAY) ×3 IMPLANT
TROCAR XCEL BLUNT TIP 100MML (ENDOMECHANICALS) ×3 IMPLANT
TROCAR XCEL NON-BLD 5MMX100MML (ENDOMECHANICALS) ×6 IMPLANT
TUBING INSUFFLATION (TUBING) ×3 IMPLANT

## 2016-06-25 NOTE — Interval H&P Note (Signed)
History and Physical Interval Note:  06/25/2016 8:22 AM  Joseph [name]  has presented today for surgery, with the diagnosis of Appendicitis  The various methods of treatment have been discussed with the patient and family. After consideration of risks, benefits and other options for treatment, the patient has consented to  Procedure(s): LAPAROSCOPIC APPENDECTOMY, POSSIBLE OPEN (N/A) as a surgical intervention .  The patient's history has been reviewed, patient examined, no change in status, stable for surgery.  I have reviewed the patient's chart and labs.  Questions were answered to the patient's satisfaction.     TOTH III,PAUL S

## 2016-06-25 NOTE — Progress Notes (Signed)
1200 received pt from PACU, A&O x4, sleepy, refused O2, sats 94% at room air. Denies pain at this time. 3 lap sites to mid abd with dermabond dry and intact.

## 2016-06-25 NOTE — Anesthesia Postprocedure Evaluation (Addendum)
Anesthesia Post Note  Patient: Joseph [name]  Procedure(s) Performed: Procedure(s) (LRB): LAPAROSCOPIC APPENDECTOMY (N/A)  Patient location during evaluation: PACU Anesthesia Type: General Level of consciousness: awake and alert Pain management: pain level controlled Vital Signs Assessment: post-procedure vital signs reviewed and stable Respiratory status: spontaneous breathing, nonlabored ventilation, respiratory function stable and patient connected to nasal cannula oxygen Cardiovascular status: blood pressure returned to baseline and stable Postop Assessment: no signs of nausea or vomiting Anesthetic complications: no       Last Vitals:  Vitals:   06/25/16 1231 06/25/16 1443  BP: 140/87 (!) 152/84  Pulse: 98 100  Resp:  18  Temp: 36.4 C 36.2 C    Last Pain:  Vitals:   06/25/16 1641  TempSrc:   PainSc: 6                  Stepfon Rawles

## 2016-06-25 NOTE — Transfer of Care (Signed)
Immediate Anesthesia Transfer of Care Note  Patient: Joseph [name]  Procedure(s) Performed: Procedure(s): LAPAROSCOPIC APPENDECTOMY (N/A)  Patient Location: PACU  Anesthesia Type:General  Level of Consciousness: awake, oriented, sedated, patient cooperative and responds to stimulation  Airway & Oxygen Therapy: Patient Spontanous Breathing and Patient connected to face mask oxygen  Post-op Assessment: Report given to RN, Post -op Vital signs reviewed and stable, Patient moving all extremities and Patient moving all extremities X 4  Post vital signs: Reviewed and stable  Last Vitals:  Vitals:   06/25/16 0713 06/25/16 1021  BP: (!) 133/91   Pulse: 90   Resp: 18   Temp: 37.2 C (P) 36.6 C    Last Pain:  Vitals:   06/25/16 1021  TempSrc:   PainSc: (P) Asleep      Patients Stated Pain Goal: 3 (06/25/16 0719)  Complications: No apparent anesthesia complications

## 2016-06-25 NOTE — Anesthesia Preprocedure Evaluation (Signed)
Anesthesia Evaluation  Patient identified by MRN, date of birth, ID band Patient awake    Reviewed: Allergy & Precautions, NPO status , Patient's Chart, lab work & pertinent test results  History of Anesthesia Complications Negative for: history of anesthetic complications  Airway Mallampati: II  TM Distance: >3 FB Neck ROM: Full    Dental  (+) Teeth Intact   Pulmonary former smoker,    breath sounds clear to auscultation       Cardiovascular negative cardio ROS   Rhythm:Regular     Neuro/Psych negative neurological ROS  negative psych ROS   GI/Hepatic Neg liver ROS, GERD  Controlled,Appendicitis    Endo/Other  diabetes, Type obesity  Renal/GU negative Renal ROS  negative genitourinary   Musculoskeletal negative musculoskeletal ROS (+)   Abdominal   Peds negative pediatric ROS (+)  Hematology negative hematology ROS (+)   Anesthesia Other Findings   Reproductive/Obstetrics negative OB ROS                             Anesthesia Physical Anesthesia Plan  ASA: II  Anesthesia Plan: General   Post-op Pain Management:    Induction: Intravenous  Airway Management Planned: Oral ETT  Additional Equipment: None  Intra-op Plan:   Post-operative Plan: Extubation in OR  Informed Consent: I have reviewed the patients History and Physical, chart, labs and discussed the procedure including the risks, benefits and alternatives for the proposed anesthesia with the patient or authorized representative who has indicated his/her understanding and acceptance.   Dental advisory given  Plan Discussed with: CRNA and Surgeon  Anesthesia Plan Comments:         Anesthesia Quick Evaluation

## 2016-06-25 NOTE — H&P (Signed)
Joseph ScalesMichael T [name]  [facility] Patient #: [id number] DOB: 10/07/1980 Single / Language: [place] / Race: Black or African American Male   History of Present Illness The patient is a 36 year old male who presents for a follow-up for Abdominal pain. The patient is a 36 year old black male who was hospitalized about 2-3 weeks ago with appendicitis. He had actually had a partial appendectomy and drainage of an abscess in [place] several months prior. He responded to antibiotics and percutaneous drainage. His drain was removed today and he tolerated this well. His appetite is good and his bowels are working normally he denies any fevers and chills.   Past Surgical History  No pertinent past surgical history   Diagnostic Studies History Colonoscopy  never  Allergies  No Known Drug Allergies    Medication History  Lisinopril (10MG  Tablet, Oral daily) Active. Amoxicillin-Pot Clavulanate (875-125MG  Tablet, Oral two times daily) Active. Basaglar KwikPen (100UNIT/ML Soln Pen-inj, Subcutaneous daily) Active. BD Pen Needle Mini U/F (31G X 5 MM Misc,) Active. HydroCHLOROthiazide (12.5MG  Capsule, Oral daily) Active. Klor-Con M20 (20MEQ Tablet ER, Oral daily) Active. Medications Reconciled  Social History  Alcohol use  Occasional alcohol use. Caffeine use  Tea. No drug use  Tobacco use  Former smoker.  Family History  Diabetes Mellitus  Father. Heart Disease  Mother.  Other Problems  Diabetes Mellitus     Review of Systems  Skin Not Present- Change in Wart/Mole, Dryness, Hives, Jaundice, New Lesions, Non-Healing Wounds, Rash and Ulcer. Respiratory Not Present- Bloody sputum, Chronic Cough, Difficulty Breathing, Snoring and Wheezing. Breast Not Present- Breast Mass, Breast Pain, Nipple Discharge and Skin Changes. Gastrointestinal Not Present- Abdominal Pain, Bloating, Bloody Stool, Change in Bowel Habits, Chronic diarrhea, Constipation,  Difficulty Swallowing, Excessive gas, Gets full quickly at meals, Hemorrhoids, Indigestion, Nausea, Rectal Pain and Vomiting. Male Genitourinary Not Present- Blood in Urine, Change in Urinary Stream, Frequency, Impotence, Nocturia, Painful Urination, Urgency and Urine Leakage. Neurological Not Present- Decreased Memory, Fainting, Headaches, Numbness, Seizures, Tingling, Tremor, Trouble walking and Weakness. Psychiatric Not Present- Anxiety, Bipolar, Change in Sleep Pattern, Depression, Fearful and Frequent crying. Endocrine Not Present- Cold Intolerance, Excessive Hunger, Hair Changes, Heat Intolerance, Hot flashes and New Diabetes. Hematology Not Present- Blood Thinners, Easy Bruising, Excessive bleeding, Gland problems, HIV and Persistent Infections.  Vitals  Weight: 245.2 lb Height: 72in Body Surface Area: 2.32 m Body Mass Index: 33.25 kg/m  Temp.: 98.69F  Pulse: 108 (Regular)  BP: 136/82 (Sitting, Left Arm, Standard)       Physical Exam  General Mental Status-Alert. General Appearance-Consistent with stated age. Hydration-Well hydrated. Voice-Normal.  Head and Neck Head-normocephalic, atraumatic with no lesions or palpable masses. Trachea-midline. Thyroid Gland Characteristics - normal size and consistency.  Eye Eyeball - Bilateral-Extraocular movements intact. Sclera/Conjunctiva - Bilateral-No scleral icterus.  Chest and Lung Exam Chest and lung exam reveals -quiet, even and easy respiratory effort with no use of accessory muscles and on auscultation, normal breath sounds, no adventitious sounds and normal vocal resonance. Inspection Chest Wall - Normal. Back - normal.  Cardiovascular Cardiovascular examination reveals -normal heart sounds, regular rate and rhythm with no murmurs and normal pedal pulses bilaterally.  Abdomen Note: The abdomen is soft with mild lower abdominal tenderness. There is no palpable mass or  peritonitis   Neurologic Neurologic evaluation reveals -alert and oriented x 3 with no impairment of recent or remote memory. Mental Status-Normal.  Musculoskeletal Normal Exam - Left-Upper Extremity Strength Normal and Lower Extremity Strength Normal. Normal  Exam - Right-Upper Extremity Strength Normal and Lower Extremity Strength Normal.  Lymphatic Head & Neck  General Head & Neck Lymphatics: Bilateral - Description - Normal. Axillary  General Axillary Region: Bilateral - Description - Normal. Tenderness - Non Tender. Femoral & Inguinal  Generalized Femoral & Inguinal Lymphatics: Bilateral - Description - Normal. Tenderness - Non Tender.    Assessment & Plan  ACUTE APPENDICITIS WITH LOCALIZED PERITONITIS (K35.3) Impression: The patient was recently hospitalized with appendicitis that required a drain and antibiotics. His drain was removed today. He has continued to improve. At this point I would like him to continue to stay on antibiotics for at least another week. I would recommend that he consider an interval appendectomy in about 6 weeks since this is now happened twice. I discussed with him in detail the risks and benefits of the operation as well as some of the technical aspects and he understands and wishes to proceed Current Plans Started Augmentin 875-125MG , 1 (one) Tablet two times daily, #14, 05/14/2016, Ref. x1.

## 2016-06-25 NOTE — Op Note (Signed)
06/25/2016  9:56 AM  PATIENT:  Joseph [name]  36 y.o. male  PRE-OPERATIVE DIAGNOSIS:  Recurrent Appendicitis  POST-OPERATIVE DIAGNOSIS:  Recurrent Appendicitis  PROCEDURE:  Procedure(s): LAPAROSCOPIC APPENDECTOMY (N/A)  SURGEON:  Surgeon(s) and Role:    * Griselda Miner, MD - Primary  PHYSICIAN ASSISTANT:   ASSISTANTS: none   ANESTHESIA:   local and general  EBL:  Total I/O In: 1050 [I.V.:1050] Out: 210 [Urine:200; Blood:10]  BLOOD ADMINISTERED:none  DRAINS: none   LOCAL MEDICATIONS USED:  MARCAINE     SPECIMEN:  Source of Specimen:  appendix  DISPOSITION OF SPECIMEN:  PATHOLOGY  COUNTS:  YES  TOURNIQUET:  * No tourniquets in log *  DICTATION: .Dragon Dictation   After informed consent was obtained patient was brought to the operating room placed in the supine position on the operating room table. After adequate induction of general anesthesia the patient's abdomen was prepped with ChloraPrep, allowed to dry, and draped in usual sterile manner. An appropriate timeout was performed. The area below the umbilicus was infiltrated with quarter percent Marcaine. A small incision was made with a 15 blade knife. This incision was carried down through the subcutaneous tissue bluntly with a hemostat and Army-Navy retractors until the linea alba was identified. The linea alba was incised with a 15 blade knife. Each side was grasped Coker clamps and elevated anteriorly. The preperitoneal space was probed bluntly with a hemostat until the peritoneum was opened and access was gained to the abdominal cavity. A 0 Vicryl purse string stitch was placed in the fascia surrounding the opening. A Hassan cannula was placed through the opening and anchored in place with the previously placed Vicryl purse string stitch. The laparoscope was placed through the [facility] cannula. The abdomen was insufflated with carbon dioxide without difficulty. Next the suprapubic area was infiltrated with quarter percent  Marcaine. A small incision was made with a 15 blade knife. A 5 mm port was placed bluntly through this incision into the abdominal cavity. A site was then chosen between the 2 port for placement of a 5 mm port. The area was infiltrated with quarter percent Marcaine. A small stab incision was made with a 15 blade knife. A 5 mm port was placed bluntly through this incision and the abdominal cavity under direct vision. The laparoscope was then moved to the suprapubic port. Using a Glassman grasper and harmonic scalpel the right lower quadrant was inspected. There were some adhesions that were taken down bluntly. There was evidence of inflammation in the RLQ. The appendix was readily identified. The appendix was elevated anteriorly and the mesoappendix was taken down sharply with the harmonic scalpel. Once the base of the appendix where it joined the cecum was identified and cleared of any tissue then a laparoscopic GIA blue load 6 row stapler was placed through the [facility] cannula. The stapler was placed across the base of the appendix clamped and fired thereby dividing the base of the appendix between staple lines. A laparoscopic bag was then inserted through the [facility] cannula. The appendix was placed within the bag and the bag was sealed. The abdomen was then irrigated with copious amounts of saline until the effluent was clear. No other abnormalities were noted. The appendix and bag were removed with the [facility] cannula through the infraumbilical port without difficulty. The fascial defect was closed with the previously placed Vicryl pursestring stitch as well as with another interrupted 0 Vicryl figure-of-eight stitch. The rest of the ports were removed under direct  vision and were found to be hemostatic. The gas was allowed to escape. The skin incisions were closed with interrupted 4-0 Monocryl subcuticular stitches. Dermabond dressings were applied. The patient tolerated the procedure well. At the end of the case  all needle sponge and instrument counts were correct. The patient was then awakened and taken to recovery in stable condition.  PLAN OF CARE: Admit for overnight observation  PATIENT DISPOSITION:  PACU - hemodynamically stable.   Delay start of Pharmacological VTE agent (>24hrs) due to surgical blood loss or risk of bleeding: no

## 2016-06-25 NOTE — Anesthesia Procedure Notes (Signed)
Procedure Name: Intubation Date/Time: 06/25/2016 8:43 AM Performed by: Jacquiline Doe A Pre-anesthesia Checklist: Patient identified, Emergency Drugs available, Suction available and Patient being monitored Patient Re-evaluated:Patient Re-evaluated prior to inductionOxygen Delivery Method: Circle System Utilized and Circle system utilized Preoxygenation: Pre-oxygenation with 100% oxygen Intubation Type: IV induction and Cricoid Pressure applied Ventilation: Mask ventilation without difficulty and Oral airway inserted - appropriate to patient size Laryngoscope Size: Mac and 4 Grade View: Grade II Tube type: Oral Tube size: 7.5 mm Number of attempts: 1 Airway Equipment and Method: Stylet Placement Confirmation: ETT inserted through vocal cords under direct vision,  positive ETCO2 and breath sounds checked- equal and bilateral Secured at: 24 cm Tube secured with: Tape Dental Injury: Teeth and Oropharynx as per pre-operative assessment

## 2016-06-26 ENCOUNTER — Encounter (HOSPITAL_COMMUNITY): Payer: Self-pay | Admitting: General Surgery

## 2016-06-26 DIAGNOSIS — Z79899 Other long term (current) drug therapy: Secondary | ICD-10-CM | POA: Diagnosis not present

## 2016-06-26 LAB — GLUCOSE, CAPILLARY: GLUCOSE-CAPILLARY: 131 mg/dL — AB (ref 65–99)

## 2016-06-26 MED ORDER — PANTOPRAZOLE SODIUM 40 MG PO TBEC: 40.0000 mg | DELAYED_RELEASE_TABLET | Freq: Every day | ORAL | Status: DC

## 2016-06-26 MED ORDER — HYDROCODONE-ACETAMINOPHEN 5-325 MG PO TABS: 1.0000 | ORAL_TABLET | Freq: Four times a day (QID) | ORAL | 0 refills | Status: DC | PRN

## 2016-06-26 NOTE — Progress Notes (Signed)
1 Day Post-Op   Subjective/Chief Complaint: No complaints   Objective: Vital signs in last 24 hours: Temp:  [97.1 F (36.2 C)-98.2 F (36.8 C)] 97.8 F (36.6 C) (05/10 0551) Pulse Rate:  [86-112] 91 (05/10 0551) Resp:  [18-30] 18 (05/10 0551) BP: (134-171)/(84-99) 136/86 (05/10 0551) SpO2:  [94 %-100 %] 98 % (05/10 0551) Weight:  [117.9 kg (260 lb)] 117.9 kg (260 lb) (05/09 2140)    Intake/Output from previous day: 05/09 0701 - 05/10 0700 In: 3505.3 [P.O.:864; I.V.:2591.3; IV Piggyback:50] Out: 210 [Urine:200; Blood:10] Intake/Output this shift: Total I/O In: 480 [P.O.:480] Out: -   General appearance: alert and cooperative Resp: clear to auscultation bilaterally Cardio: regular rate and rhythm GI: soft, nontender. incisions look good  Lab Results:  No results for input([name]): WBC, HGB, HCT, PLT in the last 72 hours. BMET No results for input([name]): NA, K, CL, CO2, GLUCOSE, BUN, CREATININE, CALCIUM in the last 72 hours. PT/INR No results for input([name]): LABPROT, INR in the last 72 hours. ABG No results for input([name]): PHART, HCO3 in the last 72 hours.  Invalid input([name]): PCO2, PO2  Studies/Results: No results found.  Anti-infectives: Anti-infectives    Start     Dose/Rate Route Frequency Ordered Stop   06/25/16 2000  cefoTEtan (CEFOTAN) 2 g in dextrose 5 % 50 mL IVPB     2 g 100 mL/hr over 30 Minutes Intravenous Every 12 hours 06/25/16 1213     06/25/16 0711  cefoTEtan in Dextrose 5% (CEFOTAN) 2-2.08 GM-% IVPB    Comments:  [place], Jessica   : cabinet override      06/25/16 0711 06/25/16 0830   06/25/16 0704  cefoTEtan in Dextrose 5% (CEFOTAN) IVPB 2 g     2 g Intravenous On call to O.R. 06/25/16 0704 06/25/16 0900      Assessment/Plan: [name]/p Procedure([name]): LAPAROSCOPIC APPENDECTOMY (N/A) Advance diet Discharge  LOS: 0 days    Joseph [name],Joseph [name] 06/26/2016

## 2016-06-26 NOTE — Progress Notes (Signed)
Discharge home. Home discharge instruction given, no question verbalized. 

## 2016-07-21 NOTE — Addendum Note (Signed)
Addendum  created 07/21/16 1455 by Val EagleMoser, Andrez Lieurance, MD   Sign clinical note

## 2016-07-30 ENCOUNTER — Other Ambulatory Visit: Payer: Self-pay | Admitting: Endocrinology

## 2016-08-01 ENCOUNTER — Encounter: Payer: Self-pay | Admitting: Radiology

## 2016-08-19 ENCOUNTER — Ambulatory Visit: Payer: BLUE CROSS/BLUE SHIELD | Admitting: Endocrinology

## 2016-08-19 DIAGNOSIS — Z0289 Encounter for other administrative examinations: Secondary | ICD-10-CM

## 2016-08-27 ENCOUNTER — Encounter: Payer: Self-pay | Admitting: Family Medicine

## 2016-08-27 ENCOUNTER — Ambulatory Visit (INDEPENDENT_AMBULATORY_CARE_PROVIDER_SITE_OTHER): Payer: Self-pay | Admitting: Family Medicine

## 2016-08-27 VITALS — BP 142/90 | HR 86 | Temp 97.8°F | Resp 14 | Ht 72.0 in | Wt 274.0 lb

## 2016-08-27 DIAGNOSIS — R030 Elevated blood-pressure reading, without diagnosis of hypertension: Secondary | ICD-10-CM

## 2016-08-27 DIAGNOSIS — E109 Type 1 diabetes mellitus without complications: Secondary | ICD-10-CM

## 2016-08-27 DIAGNOSIS — E139 Other specified diabetes mellitus without complications: Secondary | ICD-10-CM

## 2016-08-27 MED ORDER — ONETOUCH VERIO FLEX SYSTEM W/DEVICE KIT: 1.0000 | PACK | Freq: Three times a day (TID) | 0 refills | Status: DC

## 2016-08-27 MED ORDER — GLUCOSE BLOOD VI STRP: ORAL_STRIP | 12 refills | Status: DC

## 2016-08-27 MED ORDER — LISINOPRIL 10 MG PO TABS: 10.0000 mg | ORAL_TABLET | Freq: Every day | ORAL | 1 refills | Status: DC

## 2016-08-27 NOTE — Patient Instructions (Signed)
I have sent in your diabetic testing supplies and your blood pressure medication Please restart your lisinopril Make a nurse visit appointment for 2 weeks to recheck your blood pressure  DASH Eating Plan DASH stands for "Dietary Approaches to Stop Hypertension." The DASH eating plan is a healthy eating plan that has been shown to reduce high blood pressure (hypertension). It may also reduce your risk for type 2 diabetes, heart disease, and stroke. The DASH eating plan may also help with weight loss. What are tips for following this plan? General guidelines  Avoid eating more than 2,300 mg (milligrams) of salt (sodium) a day. If you have hypertension, you may need to reduce your sodium intake to 1,500 mg a day.  Limit alcohol intake to no more than 1 drink a day for nonpregnant women and 2 drinks a day for men. One drink equals 12 oz of beer, 5 oz of wine, or 1 oz of hard liquor.  Work with your health care provider to maintain a healthy body weight or to lose weight. Ask what an ideal weight is for you.  Get at least 30 minutes of exercise that causes your heart to beat faster (aerobic exercise) most days of the week. Activities may include walking, swimming, or biking.  Work with your health care provider or diet and nutrition specialist (dietitian) to adjust your eating plan to your individual calorie needs. Reading food labels  Check food labels for the amount of sodium per serving. Choose foods with less than 5 percent of the Daily Value of sodium. Generally, foods with less than 300 mg of sodium per serving fit into this eating plan.  To find whole grains, look for the word "whole" as the first word in the ingredient list. Shopping  Buy products labeled as "low-sodium" or "no salt added."  Buy fresh foods. Avoid canned foods and premade or frozen meals. Cooking  Avoid adding salt when cooking. Use salt-free seasonings or herbs instead of table salt or sea salt. Check with your  health care provider or pharmacist before using salt substitutes.  Do not fry foods. Cook foods using healthy methods such as baking, boiling, grilling, and broiling instead.  Cook with heart-healthy oils, such as olive, canola, soybean, or sunflower oil. Meal planning   Eat a balanced diet that includes: ? 5 or more servings of fruits and vegetables each day. At each meal, try to fill half of your plate with fruits and vegetables. ? Up to 6-8 servings of whole grains each day. ? Less than 6 oz of lean meat, poultry, or fish each day. A 3-oz serving of meat is about the same size as a deck of cards. One egg equals 1 oz. ? 2 servings of low-fat dairy each day. ? A serving of nuts, seeds, or beans 5 times each week. ? Heart-healthy fats. Healthy fats called Omega-3 fatty acids are found in foods such as flaxseeds and coldwater fish, like sardines, salmon, and mackerel.  Limit how much you eat of the following: ? Canned or prepackaged foods. ? Food that is high in trans fat, such as fried foods. ? Food that is high in saturated fat, such as fatty meat. ? Sweets, desserts, sugary drinks, and other foods with added sugar. ? Full-fat dairy products.  Do not salt foods before eating.  Try to eat at least 2 vegetarian meals each week.  Eat more home-cooked food and less restaurant, buffet, and fast food.  When eating at a restaurant, ask that  your food be prepared with less salt or no salt, if possible. What foods are recommended? The items listed may not be a complete list. Talk with your dietitian about what dietary choices are best for you. Grains Whole-grain or whole-wheat bread. Whole-grain or whole-wheat pasta. Brown rice. [place]. Bulgur. Whole-grain and low-sodium cereals. Pita bread. Low-fat, low-sodium crackers. Whole-wheat flour tortillas. Vegetables Fresh or frozen vegetables (raw, steamed, roasted, or grilled). Low-sodium or reduced-sodium tomato and vegetable juice.  Low-sodium or reduced-sodium tomato sauce and tomato paste. Low-sodium or reduced-sodium canned vegetables. Fruits All fresh, dried, or frozen fruit. Canned fruit in natural juice (without added sugar). Meat and other protein foods Skinless chicken or [place]. Ground chicken or [place]. Pork with fat trimmed off. Fish and seafood. Egg whites. Dried beans, peas, or lentils. Unsalted nuts, nut butters, and seeds. Unsalted canned beans. Lean cuts of beef with fat trimmed off. Low-sodium, lean deli meat. Dairy Low-fat (1%) or fat-free (skim) milk. Fat-free, low-fat, or reduced-fat cheeses. Nonfat, low-sodium ricotta or cottage cheese. Low-fat or nonfat yogurt. Low-fat, low-sodium cheese. Fats and oils Soft margarine without trans fats. Vegetable oil. Low-fat, reduced-fat, or light mayonnaise and salad dressings (reduced-sodium). Canola, safflower, olive, soybean, and sunflower oils. Avocado. Seasoning and other foods Herbs. Spices. Seasoning mixes without salt. Unsalted popcorn and pretzels. Fat-free sweets. What foods are not recommended? The items listed may not be a complete list. Talk with your dietitian about what dietary choices are best for you. Grains Baked goods made with fat, such as croissants, muffins, or some breads. Dry pasta or rice meal packs. Vegetables Creamed or fried vegetables. Vegetables in a cheese sauce. Regular canned vegetables (not low-sodium or reduced-sodium). Regular canned tomato sauce and paste (not low-sodium or reduced-sodium). Regular tomato and vegetable juice (not low-sodium or reduced-sodium). Angie Fava. Olives. Fruits Canned fruit in a light or heavy syrup. Fried fruit. Fruit in cream or butter sauce. Meat and other protein foods Fatty cuts of meat. Ribs. Fried meat. Berniece Salines. Sausage. Bologna and other processed lunch meats. Salami. Fatback. Hotdogs. Bratwurst. Salted nuts and seeds. Canned beans with added salt. Canned or smoked fish. Whole eggs or egg yolks. Chicken  or [place] with skin. Dairy Whole or 2% milk, cream, and half-and-half. Whole or full-fat cream cheese. Whole-fat or sweetened yogurt. Full-fat cheese. Nondairy creamers. Whipped toppings. Processed cheese and cheese spreads. Fats and oils Butter. Stick margarine. Lard. Shortening. Ghee. Bacon fat. Tropical oils, such as coconut, palm kernel, or palm oil. Seasoning and other foods Salted popcorn and pretzels. Onion salt, garlic salt, seasoned salt, table salt, and sea salt. Worcestershire sauce. Tartar sauce. Barbecue sauce. Teriyaki sauce. Soy sauce, including reduced-sodium. Steak sauce. Canned and packaged gravies. Fish sauce. Oyster sauce. Cocktail sauce. Horseradish that you find on the shelf. Ketchup. Mustard. Meat flavorings and tenderizers. Bouillon cubes. Hot sauce and Tabasco sauce. Premade or packaged marinades. Premade or packaged taco seasonings. Relishes. Regular salad dressings. Where to find more information:  National Heart, Lung, and [place]: https://wilson-eaton.com/  American Heart Association: www.heart.org Summary  The DASH eating plan is a healthy eating plan that has been shown to reduce high blood pressure (hypertension). It may also reduce your risk for type 2 diabetes, heart disease, and stroke.  With the DASH eating plan, you should limit salt (sodium) intake to 2,300 mg a day. If you have hypertension, you may need to reduce your sodium intake to 1,500 mg a day.  When on the DASH eating plan, aim to eat more fresh fruits and  vegetables, whole grains, lean proteins, low-fat dairy, and heart-healthy fats.  Work with your health care provider or diet and nutrition specialist (dietitian) to adjust your eating plan to your individual calorie needs. This information is not intended to replace advice given to you by your health care provider. Make sure you discuss any questions you have with your health care provider. Document Released: 01/23/2011 Document Revised:  01/28/2016 Document Reviewed: 01/28/2016 Elsevier Interactive Patient Education  2017 [employer].

## 2016-08-27 NOTE — Progress Notes (Signed)
Subjective:    Patient ID: Joseph [name], male    DOB: 1980/09/01, 36 y.o.   MRN: [id number]  HPI This is a 36 yo male who presents today with concerns for elevated BP. Stopped taking his blood pressure medication several weeks ago. Has not been checking blood sugars at home, lost part of kit. Takes insulin 110 units daily. Admits to poor diet recently, working several jobs and not eating/sleeping well or exercising. He sees Dr. Loanne Drilling and states he has an upcoming appointment.   Past Medical History:  Diagnosis Date  . Diabetes mellitus without complication (HCC)    insulin treated type 2  . GERD (gastroesophageal reflux disease)   . Hyperlipidemia   . Hypertension    Past Surgical History:  Procedure Laterality Date  . APPENDECTOMY     attempted removal, too much swelling  . IR RADIOLOGIST EVAL & MGMT  05/14/2016  . LAPAROSCOPIC APPENDECTOMY  06/25/2016  . LAPAROSCOPIC APPENDECTOMY N/A 06/25/2016   Procedure: LAPAROSCOPIC APPENDECTOMY;  Surgeon: Jovita Kussmaul, MD;  Location: [facility] OR;  Service: General;  Laterality: N/A;  . wisdom teeth pulled     Family History  Problem Relation Age of Onset  . Diabetes Father   . Hypertension Maternal Grandmother   . Cancer Neg Hx   . Early death Neg Hx   . Heart disease Neg Hx   . Hyperlipidemia Neg Hx   . Kidney disease Neg Hx   . Learning disabilities Neg Hx   . Stroke Neg Hx       Review of Systems Per HPI    Objective:   Physical Exam  Constitutional: He is oriented to person, place, and time. He appears well-developed and well-nourished. No distress.  Obese.   HENT:  Head: Normocephalic and atraumatic.  Eyes: Conjunctivae are normal.  Cardiovascular: Normal rate.   Pulmonary/Chest: Effort normal.  Neurological: He is alert and oriented to person, place, and time.  Skin: Skin is warm and dry. He is not diaphoretic.  Psychiatric: He has a normal mood and affect. His behavior is normal. Judgment and thought content normal.    Vitals reviewed.     BP (!) 142/90 (BP Location: Left Arm, Patient Position: Sitting, Cuff Size: Normal)   Pulse 86   Temp 97.8 F (36.6 C) (Oral)   Resp 14   Ht 6' (1.829 m)   Wt 274 lb (124.3 kg)   SpO2 99%   BMI 37.16 kg/m  Wt Readings from Last 3 Encounters:  08/27/16 274 lb (124.3 kg)  06/25/16 260 lb (117.9 kg)  06/19/16 256 lb (116.1 kg)       Assessment & Plan:  1. Diabetes 1.5, managed as type 1 ([place]) - discussed importance of taking his medications and checking his blood sugar, will send in new meter and supplies, he was instructed to let me know if he has any difficulty obtaining - will defer labs to Dr. Loanne Drilling per patient request, no appointment on file, instructed him to make follow up appointment - glucose blood test strip; Use TID  Dispense: 100 each; Refill: 12 - lisinopril (PRINIVIL,ZESTRIL) 10 MG tablet; Take 1 tablet (10 mg total) by mouth daily.  Dispense: 90 tablet; Refill: 1 - Blood Glucose Monitoring Suppl (ONETOUCH VERIO FLEX SYSTEM) w/Device KIT; 1 Act by Does not apply route 3 (three) times daily.  Dispense: 2 kit; Refill: 0  2. Elevated blood pressure reading - mildly elevated today, will resume lisinopril, discussed importance of bp control and kidney  protective benefits with his diabetes - lisinopril (PRINIVIL,ZESTRIL) 10 MG tablet; Take 1 tablet (10 mg total) by mouth daily.  Dispense: 90 tablet; Refill: 1 - follow up BP in 2 weeks for nurse visit  Clarene Reamer, FNP-BC  [facility]  08/29/2016 5:45 PM

## 2016-09-18 ENCOUNTER — Ambulatory Visit: Payer: BLUE CROSS/BLUE SHIELD

## 2017-05-15 ENCOUNTER — Other Ambulatory Visit: Payer: Self-pay

## 2018-04-06 IMAGING — CT CT ABD-PELV W/ CM
2 of 4 series · 15 of 46 positions shown, 17 images · IV contrast (ISOVUE)
Comparison: None.

CLINICAL DATA: Cramping in the lower abdomen. Patient reports
history of appendectomy and upon 02/04/2016 with removal of
infection. Patient states the pain started suddenly this evening
with feelings of constipation.

EXAM:
CT ABDOMEN AND PELVIS WITH CONTRAST
TECHNIQUE: Multidetector CT imaging of the abdomen and pelvis was performed
using the standard protocol following bolus administration of
intravenous contrast.
CONTRAST:  100mL 2WRP2O-VTT IOPAMIDOL (2WRP2O-VTT) INJECTION 61%

[Series 2: abd/pel with · axial · 0.79mm/px · z∈[-482,-37]mm · 12 of 101 slices shown, 14 images]
[im 6/101  soft-tissue]
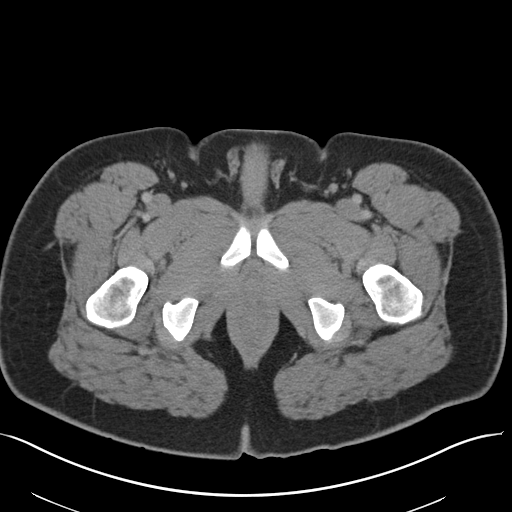
[im 6/101  bone]
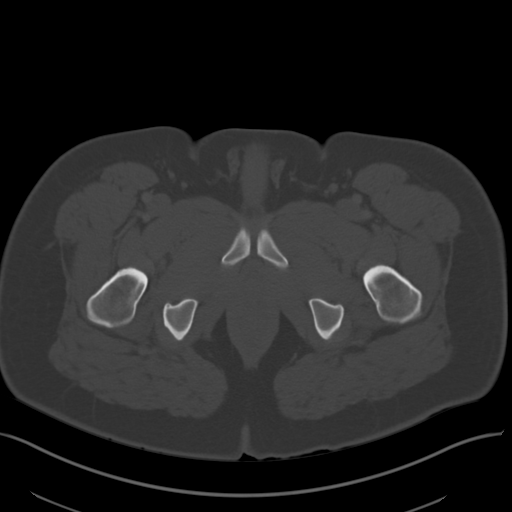
[im 18/101  soft-tissue]
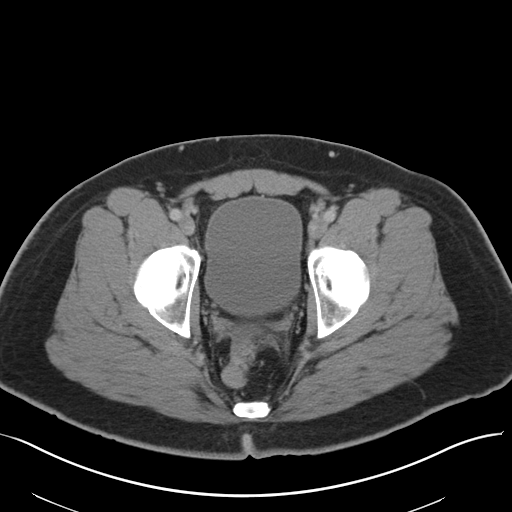
[im 24/101  soft-tissue]
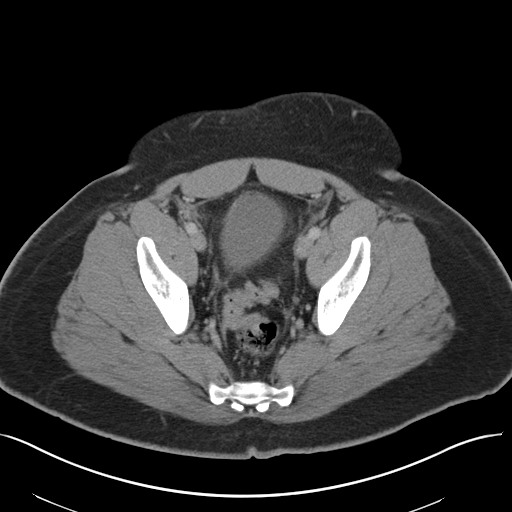
[im 30/101  soft-tissue]
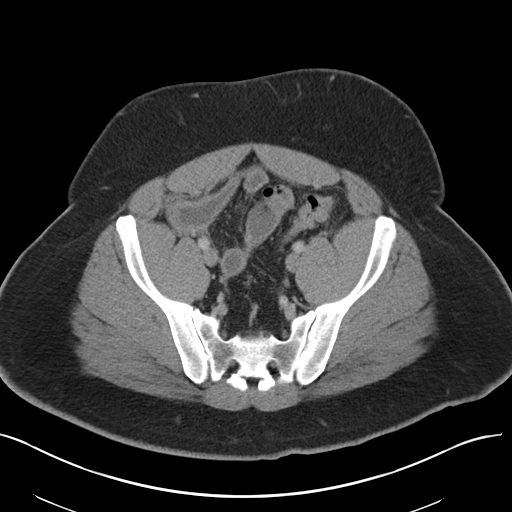
[im 42/101  soft-tissue]
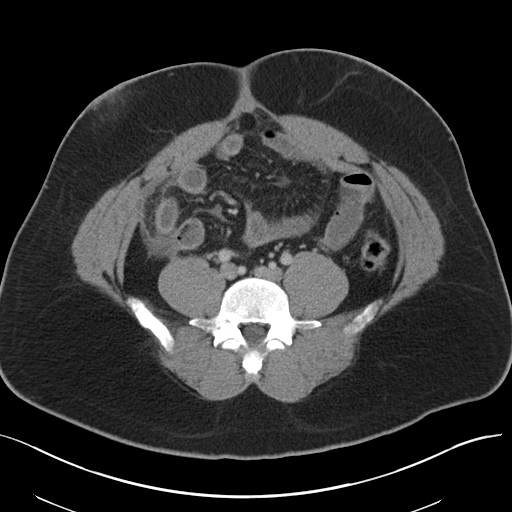
[im 48/101  soft-tissue]
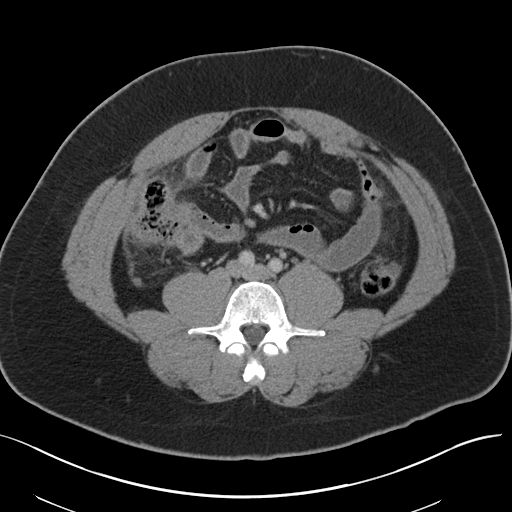
[im 53/101  soft-tissue]
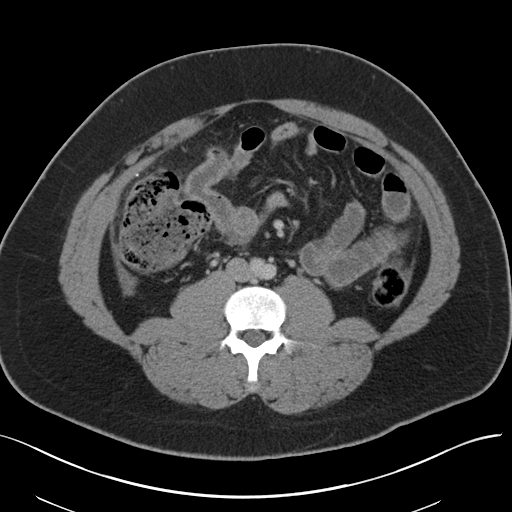
[im 65/101  soft-tissue]
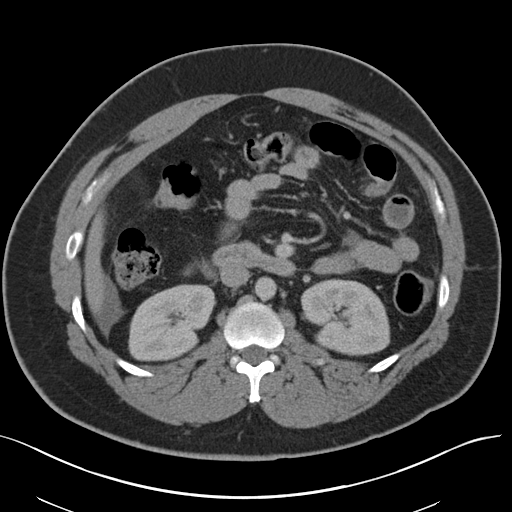
[im 71/101  soft-tissue]
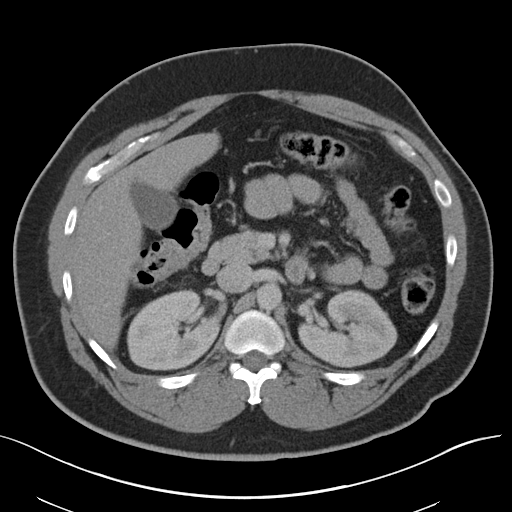
[im 71/101  bone]
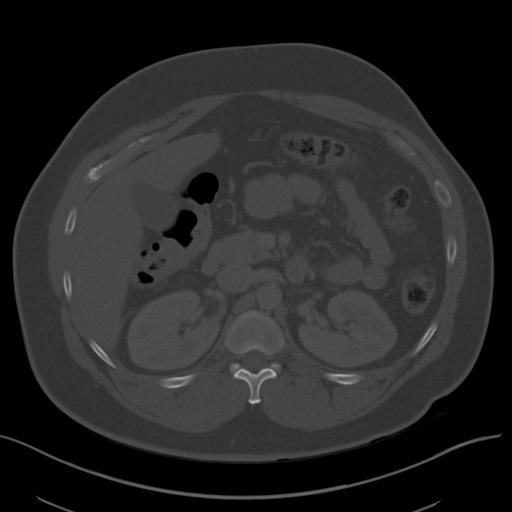
[im 77/101  soft-tissue]
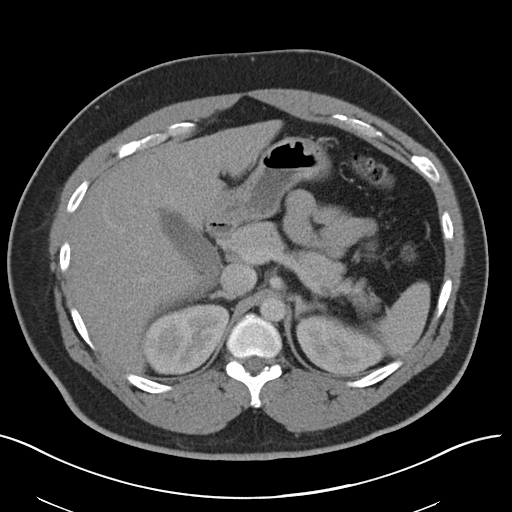
[im 89/101  soft-tissue]
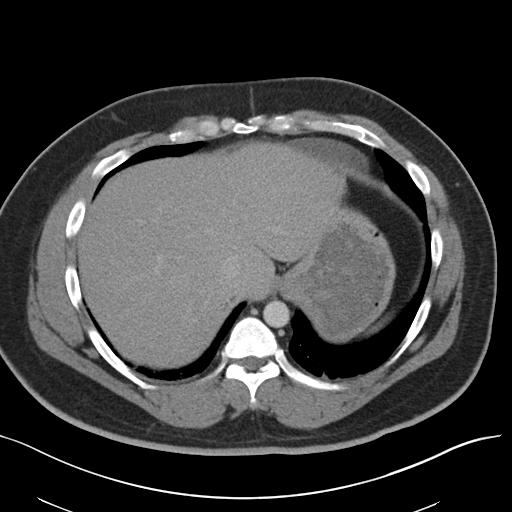
[im 95/101  soft-tissue]
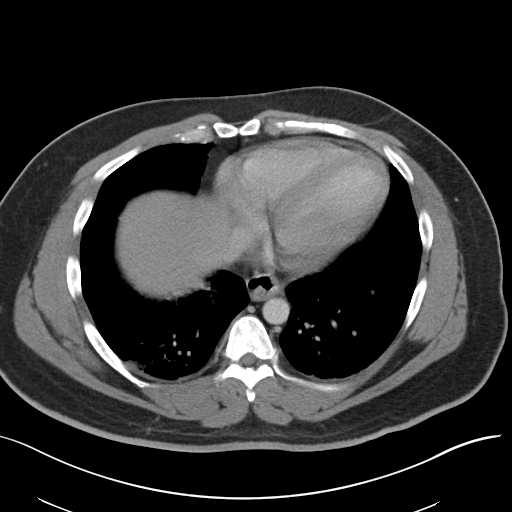

[Series 5: coronal a/|p · coronal · 0.86mm/px · 3 of 163 slices shown]
[im 55/163  soft-tissue]
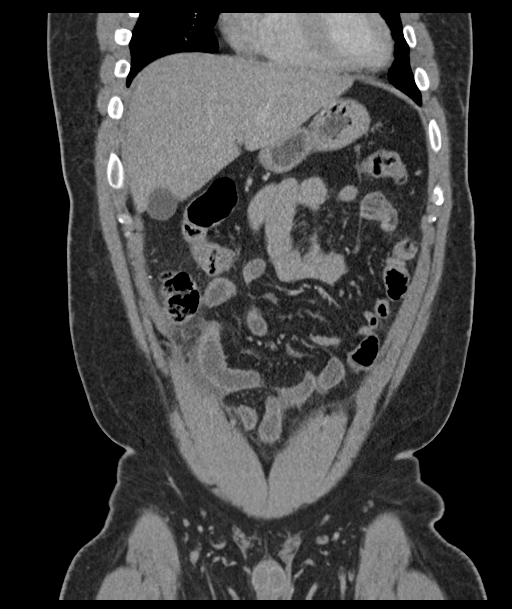
[im 73/163  soft-tissue]
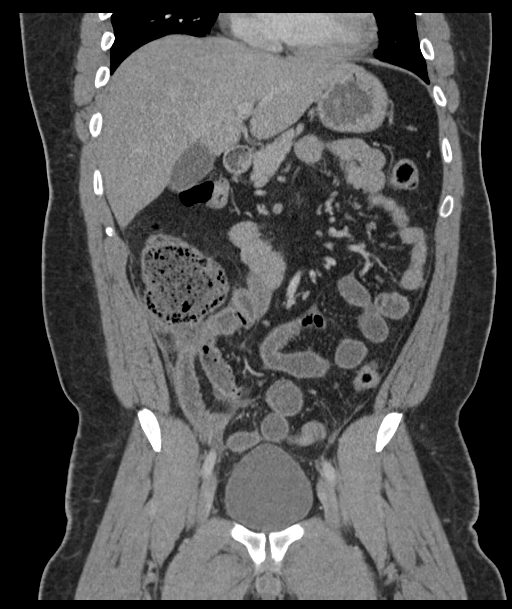
[im 91/163  soft-tissue]
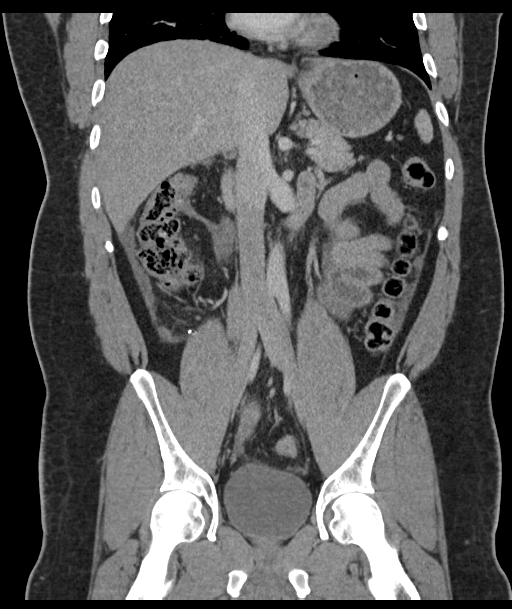

[15 of 46 positions shown; findings below may reference images not displayed]

FINDINGS: Lower chest: Small hiatal hernia. There is debris in the distal
esophagus possibly from reflux. Atelectasis noted at each lung base
bilaterally. Top normal-sized cardiac chambers. No pericardial
effusion.

Hepatobiliary: The liver is homogeneous in appearance. There is no
biliary dilatation. The gallbladder is unremarkable. There is a
small amount of subhepatic fluid adjacent to the right hepatic lobe
extending along the right pericolic gutter.

Pancreas: Unremarkable. No pancreatic ductal dilatation or
surrounding inflammatory changes.

Spleen: Normal in size without focal abnormality.

Adrenals/Urinary Tract: Adrenal glands are unremarkable. Kidneys are
normal, without renal calculi, focal lesion, or hydronephrosis.
Bladder is unremarkable.

Stomach/Bowel: There is a tubular inflamed structure in the right
lower quadrant measuring up to 14 mm in diameter with adjacent
mesenteric fatty inflammation. Additionally there are 2 surgical
clips just medial to this tubular structure which may represent
sequela of prior reported appendectomy. This current finding may
represent inflammation of an appendiceal remnant. The visualized
stomach, small and large intestine are unremarkable apart from a
large volume of stool in the region of the cecum and scattered
colonic diverticulosis along the sigmoid.

Vascular/Lymphatic: No significant vascular findings are present. No
enlarged abdominal or pelvic lymph nodes. A small reactive lymph
node is seen in the right lower quadrant mesenteric measuring 6 mm
short axis.

Reproductive: Normal size prostate.

Other: Small amount of free fluid in the pelvis and along the right
pericolic gutter.

Musculoskeletal: Nonacute
IMPRESSION: 1. Inflamed, tubular blind-ending appearing structure in the right
lower quadrant measuring up to 14 mm in diameter suspicious for
appendicitis possibly of an appendiceal remnant given patient's
history of appendectomy. This may actually represent the appendix
itself as two surgical clips are seen medial to this structure that
are also separate from the base of the cecum. Correlation with
operative report and pathology be helpful.
2. Small amount of free fluid in the right quadrant, pelvis and
right paracolic gutter.

## 2018-04-28 LAB — HM DIABETES EYE EXAM

## 2018-05-26 NOTE — Telephone Encounter (Signed)
error 

## 2019-01-24 ENCOUNTER — Other Ambulatory Visit (INDEPENDENT_AMBULATORY_CARE_PROVIDER_SITE_OTHER): Payer: BC Managed Care – PPO

## 2019-01-24 ENCOUNTER — Other Ambulatory Visit: Payer: Self-pay | Admitting: Internal Medicine

## 2019-01-24 ENCOUNTER — Ambulatory Visit (INDEPENDENT_AMBULATORY_CARE_PROVIDER_SITE_OTHER): Payer: BC Managed Care – PPO | Admitting: Internal Medicine

## 2019-01-24 ENCOUNTER — Encounter: Payer: Self-pay | Admitting: Internal Medicine

## 2019-01-24 ENCOUNTER — Other Ambulatory Visit: Payer: Self-pay

## 2019-01-24 VITALS — BP 138/88 | HR 92 | Temp 97.4°F | Resp 16 | Ht 72.0 in | Wt 237.0 lb

## 2019-01-24 DIAGNOSIS — I10 Essential (primary) hypertension: Secondary | ICD-10-CM

## 2019-01-24 DIAGNOSIS — Z0000 Encounter for general adult medical examination without abnormal findings: Secondary | ICD-10-CM | POA: Diagnosis not present

## 2019-01-24 DIAGNOSIS — Z23 Encounter for immunization: Secondary | ICD-10-CM | POA: Diagnosis not present

## 2019-01-24 DIAGNOSIS — E139 Other specified diabetes mellitus without complications: Secondary | ICD-10-CM | POA: Diagnosis not present

## 2019-01-24 LAB — URINALYSIS, ROUTINE W REFLEX MICROSCOPIC
Bilirubin Urine: NEGATIVE
Hgb urine dipstick: NEGATIVE
Ketones, ur: 40 — AB
Leukocytes,Ua: NEGATIVE
Nitrite: NEGATIVE
RBC / HPF: NONE SEEN (ref 0–?)
Specific Gravity, Urine: 1.025 (ref 1.000–1.030)
Total Protein, Urine: NEGATIVE
Urine Glucose: 500 — AB
Urobilinogen, UA: 1 (ref 0.0–1.0)
WBC, UA: NONE SEEN (ref 0–?)
pH: 6 (ref 5.0–8.0)

## 2019-01-24 LAB — HEPATIC FUNCTION PANEL
ALT: 27 U/L (ref 0–53)
AST: 16 U/L (ref 0–37)
Albumin: 4.3 g/dL (ref 3.5–5.2)
Alkaline Phosphatase: 62 U/L (ref 39–117)
Bilirubin, Direct: 0.1 mg/dL (ref 0.0–0.3)
Total Bilirubin: 1 mg/dL (ref 0.2–1.2)
Total Protein: 7.6 g/dL (ref 6.0–8.3)

## 2019-01-24 LAB — BASIC METABOLIC PANEL
BUN: 20 mg/dL (ref 6–23)
CO2: 25 mEq/L (ref 19–32)
Calcium: 9.5 mg/dL (ref 8.4–10.5)
Chloride: 103 mEq/L (ref 96–112)
Creatinine, Ser: 0.83 mg/dL (ref 0.40–1.50)
GFR: 124.25 mL/min (ref >60.00)
Glucose, Bld: 228 mg/dL — ABNORMAL HIGH (ref 70–99)
Potassium: 4 mEq/L (ref 3.5–5.1)
Sodium: 138 mEq/L (ref 135–145)

## 2019-01-24 LAB — CBC WITH DIFFERENTIAL/PLATELET
Basophils Absolute: 0 10*3/uL (ref 0.0–0.1)
Basophils Relative: 0.5 % (ref 0.0–3.0)
Eosinophils Absolute: 0 10*3/uL (ref 0.0–0.7)
Eosinophils Relative: 0.7 % (ref 0.0–5.0)
HCT: 46.3 % (ref 39.0–52.0)
Hemoglobin: 15.5 g/dL (ref 13.0–17.0)
Lymphocytes Relative: 49 % — ABNORMAL HIGH (ref 12.0–46.0)
Lymphs Abs: 2.5 10*3/uL (ref 0.7–4.0)
MCHC: 33.5 g/dL (ref 30.0–36.0)
MCV: 90.5 fl (ref 78.0–100.0)
Monocytes Absolute: 0.5 10*3/uL (ref 0.1–1.0)
Monocytes Relative: 9.2 % (ref 3.0–12.0)
Neutro Abs: 2.1 10*3/uL (ref 1.4–7.7)
Neutrophils Relative %: 40.6 % — ABNORMAL LOW (ref 43.0–77.0)
Platelets: 253 10*3/uL (ref 150.0–400.0)
RBC: 5.11 Mil/uL (ref 4.22–5.81)
RDW: 12.8 % (ref 11.5–15.5)
WBC: 5.1 10*3/uL (ref 4.0–10.5)

## 2019-01-24 LAB — LIPID PANEL
Cholesterol: 240 mg/dL — ABNORMAL HIGH (ref 0–200)
HDL: 39.4 mg/dL (ref >39.00)
LDL Cholesterol: 178 mg/dL — ABNORMAL HIGH (ref 0–99)
NonHDL: 200.68
Total CHOL/HDL Ratio: 6
Triglycerides: 111 mg/dL (ref 0.0–149.0)
VLDL: 22.2 mg/dL (ref 0.0–40.0)

## 2019-01-24 LAB — POCT GLYCOSYLATED HEMOGLOBIN (HGB A1C): Hemoglobin A1C: 12.8 % — AB (ref 4.0–5.6)

## 2019-01-24 LAB — TSH: TSH: 1.14 u[IU]/mL (ref 0.35–4.50)

## 2019-01-24 LAB — MICROALBUMIN / CREATININE URINE RATIO
Creatinine,U: 112.1 mg/dL
Microalb Creat Ratio: 4.3 mg/g (ref 0.0–30.0)
Microalb, Ur: 4.9 mg/dL — ABNORMAL HIGH (ref 0.0–1.9)

## 2019-01-24 MED ORDER — CONTOUR NEXT EZ W/DEVICE KIT: 1.0000 | PACK | Freq: Three times a day (TID) | 0 refills | Status: DC

## 2019-01-24 MED ORDER — IRBESARTAN 150 MG PO TABS: 150.0000 mg | ORAL_TABLET | Freq: Every day | ORAL | 1 refills | Status: DC

## 2019-01-24 MED ORDER — TOUJEO MAX SOLOSTAR 300 UNIT/ML ~~LOC~~ SOPN: 50.0000 [IU] | PEN_INJECTOR | Freq: Every day | SUBCUTANEOUS | 1 refills | Status: DC

## 2019-01-24 MED ORDER — METFORMIN HCL ER 750 MG PO TB24: 1500.0000 mg | ORAL_TABLET | Freq: Every day | ORAL | 1 refills | Status: DC

## 2019-01-24 NOTE — Patient Instructions (Signed)
Type 2 Diabetes Mellitus, Diagnosis, Adult Type 2 diabetes (type 2 diabetes mellitus) is a long-term (chronic) disease. In type 2 diabetes, one or both of these problems may be present:  The pancreas does not make enough of a hormone called insulin.  Cells in the body do not respond properly to insulin that the body makes (insulin resistance). Normally, insulin allows blood sugar (glucose) to enter cells in the body. The cells use glucose for energy. Insulin resistance or lack of insulin causes excess glucose to build up in the blood instead of going into cells. As a result, high blood glucose (hyperglycemia) develops. What increases the risk? The following factors may make you more likely to develop type 2 diabetes:  Having a family member with type 2 diabetes.  Being overweight or obese.  Having an inactive (sedentary) lifestyle.  Having been diagnosed with insulin resistance.  Having a history of prediabetes, gestational diabetes, or polycystic ovary syndrome (PCOS).  Being of American-Indian, African-American, Hispanic/Latino, or Asian/Pacific Islander descent. What are the signs or symptoms? In the early stage of this condition, you may not have symptoms. Symptoms develop slowly and may include:  Increased thirst (polydipsia).  Increased hunger(polyphagia).  Increased urination (polyuria).  Increased urination during the night (nocturia).  Unexplained weight loss.  Frequent infections that keep coming back (recurring).  Fatigue.  Weakness.  Vision changes, such as blurry vision.  Cuts or bruises that are slow to heal.  Tingling or numbness in the hands or feet.  Dark patches on the skin (acanthosis nigricans). How is this diagnosed? This condition is diagnosed based on your symptoms, your medical history, a physical exam, and your blood glucose level. Your blood glucose may be checked with one or more of the following blood tests:  A fasting blood glucose (FBG)  test. You will not be allowed to eat (you will fast) for 8 hours or longer before a blood sample is taken.  A random blood glucose test. This test checks blood glucose at any time of day regardless of when you ate.  An A1c (hemoglobin A1c) blood test. This test provides information about blood glucose control over the previous 2-3 months.  An oral glucose tolerance test (OGTT). This test measures your blood glucose at two times: ? After fasting. This is your baseline blood glucose level. ? Two hours after drinking a beverage that contains glucose. You may be diagnosed with type 2 diabetes if:  Your FBG level is 126 mg/dL (7.0 mmol/L) or higher.  Your random blood glucose level is 200 mg/dL (11.1 mmol/L) or higher.  Your A1c level is 6.5% or higher.  Your OGTT result is higher than 200 mg/dL (11.1 mmol/L). These blood tests may be repeated to confirm your diagnosis. How is this treated? Your treatment may be managed by a specialist called an endocrinologist. Type 2 diabetes may be treated by following instructions from your health care provider about:  Making diet and lifestyle changes. This may include: ? Following an individualized nutrition plan that is developed by a diet and nutrition specialist (registered dietitian). ? Exercising regularly. ? Finding ways to manage stress.  Checking your blood glucose level as often as told.  Taking diabetes medicines or insulin daily. This helps to keep your blood glucose levels in the healthy range. ? If you use insulin, you may need to adjust the dosage depending on how physically active you are and what foods you eat. Your health care provider will tell you how to adjust your dosage.    Taking medicines to help prevent complications from diabetes, such as: ? Aspirin. ? Medicine to lower cholesterol. ? Medicine to control blood pressure. Your health care provider will set individualized treatment goals for you. Your goals will be based on  your age, other medical conditions you have, and how you respond to diabetes treatment. Generally, the goal of treatment is to maintain the following blood glucose levels:  Before meals (preprandial): 80-130 mg/dL (4.4-7.2 mmol/L).  After meals (postprandial): below 180 mg/dL (10 mmol/L).  A1c level: less than 7%. Follow these instructions at home: Questions to ask your health care provider  Consider asking the following questions: ? Do I need to meet with a diabetes educator? ? Where can I find a support group for people with diabetes? ? What equipment will I need to manage my diabetes at home? ? What diabetes medicines do I need, and when should I take them? ? How often do I need to check my blood glucose? ? What number can I call if I have questions? ? When is my next appointment? General instructions  Take over-the-counter and prescription medicines only as told by your health care provider.  Keep all follow-up visits as told by your health care provider. This is important.  For more information about diabetes, visit: ? American Diabetes Association (ADA): www.diabetes.org ? American Association of Diabetes Educators (AADE): www.diabeteseducator.org Contact a health care provider if:  Your blood glucose is at or above 240 mg/dL (13.3 mmol/L) for 2 days in a row.  You have been sick or have had a fever for 2 days or longer, and you are not getting better.  You have any of the following problems for more than 6 hours: ? You cannot eat or drink. ? You have nausea and vomiting. ? You have diarrhea. Get help right away if:  Your blood glucose is lower than 54 mg/dL (3.0 mmol/L).  You become confused or you have trouble thinking clearly.  You have difficulty breathing.  You have moderate or large ketone levels in your urine. Summary  Type 2 diabetes (type 2 diabetes mellitus) is a long-term (chronic) disease. In type 2 diabetes, the pancreas does not make enough of a  hormone called insulin, or cells in the body do not respond properly to insulin that the body makes (insulin resistance).  This condition is treated by making diet and lifestyle changes and taking diabetes medicines or insulin.  Your health care provider will set individualized treatment goals for you. Your goals will be based on your age, other medical conditions you have, and how you respond to diabetes treatment.  Keep all follow-up visits as told by your health care provider. This is important. This information is not intended to replace advice given to you by your health care provider. Make sure you discuss any questions you have with your health care provider. Document Released: 02/03/2005 Document Revised: 04/03/2017 Document Reviewed: 03/09/2015 Elsevier Patient Education  2020 [employer]  

## 2019-01-24 NOTE — Progress Notes (Signed)
Subjective:  Patient ID: Joseph [name], male    DOB: November 26, 1980  Age: 38 y.o. MRN: [id number]  CC: Annual Exam and Diabetes  This visit occurred during the SARS-CoV-2 public health emergency.  Safety protocols were in place, including screening questions prior to the visit, additional usage of staff PPE, and extensive cleaning of exam room while observing appropriate contact time as indicated for disinfecting solutions.    HPI Joseph [name] presents for a CPX.  He complains that his blood sugars are not well controlled and he has recently been experiencing polys and weight loss.  Outpatient Medications Prior to Visit  Medication Sig Dispense Refill  . Blood Glucose Monitoring Suppl (ONETOUCH VERIO FLEX SYSTEM) w/Device KIT 1 Act by Does not apply route 3 (three) times daily. 2 kit 0  . clotrimazole-betamethasone (LOTRISONE) cream Apply 1 application topically 2 (two) times daily. 45 g 3  . glucose blood test strip Use TID 100 each 12  . ibuprofen (ADVIL,MOTRIN) 200 MG tablet You can take 2 or 3 tablets every 6 hours as needed, do not take more than this it can hurt your kidneys. (Patient not taking: Reported on 08/27/2016) 30 tablet 0  . Insulin Glargine (BASAGLAR KWIKPEN) 100 UNIT/ML SOPN Inject 1.1 mLs (110 Units total) into the skin every morning. And pen needles 2/day 40 pen 2  . Insulin Pen Needle 31G X 5 MM MISC Use 1 needle daily with insulin Dx E11.9 100 each 3  . lisinopril (PRINIVIL,ZESTRIL) 10 MG tablet Take 1 tablet (10 mg total) by mouth daily. 90 tablet 1   No facility-administered medications prior to visit.     ROS Review of Systems  Constitutional: Positive for unexpected weight change. Negative for diaphoresis and fatigue.  HENT: Negative.   Eyes: Negative for visual disturbance.  Respiratory: Negative for cough, chest tightness, shortness of breath and wheezing.   Cardiovascular: Negative for chest pain, palpitations and leg swelling.  Gastrointestinal: Negative  for abdominal pain, diarrhea, nausea and vomiting.  Endocrine: Positive for polydipsia and polyuria. Negative for polyphagia.  Genitourinary: Negative.  Negative for difficulty urinating, dysuria, scrotal swelling and testicular pain.  Musculoskeletal: Negative for arthralgias.  Skin: Negative.  Negative for color change and pallor.  Neurological: Negative.  Negative for dizziness, weakness and light-headedness.  Hematological: Negative.   Psychiatric/Behavioral: Negative.     Objective:  BP 138/88 (BP Location: Left Arm, Patient Position: Sitting, Cuff Size: Large)   Pulse 92   Temp (!) 97.4 F (36.3 C) (Oral)   Resp 16   Ht 6' (1.829 m)   Wt 237 lb (107.5 kg)   SpO2 97%   BMI 32.14 kg/m   BP Readings from Last 3 Encounters:  01/24/19 138/88  08/27/16 (!) 142/90  06/26/16 136/86    Wt Readings from Last 3 Encounters:  01/24/19 237 lb (107.5 kg)  08/27/16 274 lb (124.3 kg)  06/25/16 260 lb (117.9 kg)    Physical Exam Vitals signs reviewed.  Constitutional:      General: He is not in acute distress.    Appearance: Normal appearance. He is not ill-appearing, toxic-appearing or diaphoretic.  HENT:     Nose: Nose normal.     Mouth/Throat:     Mouth: Mucous membranes are moist.  Eyes:     General: No scleral icterus.    Conjunctiva/sclera: Conjunctivae normal.  Neck:     Musculoskeletal: Neck supple.  Cardiovascular:     Rate and Rhythm: Normal rate and regular rhythm.  Heart sounds: No murmur.  Pulmonary:     Effort: Pulmonary effort is normal.     Breath sounds: No stridor. No wheezing, rhonchi or rales.  Abdominal:     General: Abdomen is flat. Bowel sounds are normal. There is no distension.     Palpations: Abdomen is soft. There is no hepatomegaly or splenomegaly.     Tenderness: There is no abdominal tenderness.  Genitourinary:    Pubic Area: No rash.      Penis: Normal. No discharge, swelling or lesions.      Scrotum/Testes: Normal.        Right:  Mass or tenderness not present.        Left: Mass or tenderness not present.     Epididymis:     Right: Normal. Not inflamed or enlarged.     Left: Normal. Not inflamed or enlarged.  Musculoskeletal: Normal range of motion.     Right lower leg: No edema.     Left lower leg: No edema.  Lymphadenopathy:     Cervical: No cervical adenopathy.  Skin:    General: Skin is warm and dry.  Neurological:     General: No focal deficit present.     Mental Status: He is alert.  Psychiatric:        Mood and Affect: Mood normal.     Lab Results  Component Value Date   WBC 5.1 01/24/2019   HGB 15.5 01/24/2019   HCT 46.3 01/24/2019   PLT 253.0 01/24/2019   GLUCOSE 228 (H) 01/24/2019   CHOL 240 (H) 01/24/2019   TRIG 111.0 01/24/2019   HDL 39.40 01/24/2019   LDLCALC 178 (H) 01/24/2019   ALT 27 01/24/2019   AST 16 01/24/2019   NA 138 01/24/2019   K 4.0 01/24/2019   CL 103 01/24/2019   CREATININE 0.83 01/24/2019   BUN 20 01/24/2019   CO2 25 01/24/2019   TSH 1.14 01/24/2019   INR 1.00 05/02/2016   HGBA1C 12.8 (A) 01/24/2019   MICROALBUR 4.9 (H) 01/24/2019    No results found.  Assessment & Plan:   Joseph [name] was seen today for annual exam and diabetes.  Diagnoses and all orders for this visit:  Diabetes 1.5, managed as type 1 (Joseph [name])- His blood sugars are poorly controlled with an A1c of 12.8%.  He has mild ketonuria but his bicarb is normal.  I recommended that he start basal insulin and Metformin.  I have also encouraged him to be seen by endocrinology, diabetic education, and care management. -     POCT HgB A1C -     Microalbumin / creatinine urine ratio; Future -     CBC with Differential; Future -     Basic metabolic panel; Future -     TSH; Future -     Urinalysis, Routine w reflex microscopic; Future -     C-peptide; Future -     Hepatic function panel; Future -     Blood Glucose Monitoring Suppl (CONTOUR NEXT EZ) w/Device KIT; 1 Act by Does not apply route 3 (three) times  daily. -     Ambulatory referral to diabetic education -     Ambulatory referral to Endocrinology -     Consult to [facility] -     HM Diabetes Foot Exam -     Insulin Glargine, 2 Unit Dial, (TOUJEO MAX SOLOSTAR) 300 UNIT/ML SOPN; Inject 50 Units into the skin daily. -     metFORMIN (GLUCOPHAGE-XR) 750 MG  24 hr tablet; Take 2 tablets (1,500 mg total) by mouth daily with breakfast. -     irbesartan (AVAPRO) 150 MG tablet; Take 1 tablet (150 mg total) by mouth daily.  Routine general medical examination at a health care facility- Exam completed, labs reviewed, vaccines reviewed and updated, patient education was given. -     Lipid panel; Future -     HIV antibody (Reflex); Future  Need for influenza vaccination -     Flu Vaccine QUAD 36+ mos IM  Essential hypertension- He has mild microalbuminuria.  I have asked him to start taking an ARB. -     irbesartan (AVAPRO) 150 MG tablet; Take 1 tablet (150 mg total) by mouth daily.   I have discontinued Taris Argo's Insulin Pen Needle, ibuprofen, Basaglar KwikPen, clotrimazole-betamethasone, glucose blood, lisinopril, and OneTouch Verio Flex System. I am also having him start on Contour Next EZ, Toujeo Max SoloStar, metFORMIN, and irbesartan.  Meds ordered this encounter  Medications  . Blood Glucose Monitoring Suppl (CONTOUR NEXT EZ) w/Device KIT    Sig: 1 Act by Does not apply route 3 (three) times daily.    Dispense:  2 kit    Refill:  0  . Insulin Glargine, 2 Unit Dial, (TOUJEO MAX SOLOSTAR) 300 UNIT/ML SOPN    Sig: Inject 50 Units into the skin daily.    Dispense:  9 mL    Refill:  1  . metFORMIN (GLUCOPHAGE-XR) 750 MG 24 hr tablet    Sig: Take 2 tablets (1,500 mg total) by mouth daily with breakfast.    Dispense:  180 tablet    Refill:  1  . irbesartan (AVAPRO) 150 MG tablet    Sig: Take 1 tablet (150 mg total) by mouth daily.    Dispense:  90 tablet    Refill:  1     Follow-up: Return in about 3 months (around  04/24/2019).  Scarlette Calico, MD

## 2019-01-25 ENCOUNTER — Encounter: Payer: Self-pay | Admitting: Internal Medicine

## 2019-01-25 LAB — HIV ANTIBODY (ROUTINE TESTING W REFLEX): HIV 1&2 Ab, 4th Generation: NONREACTIVE

## 2019-01-25 LAB — C-PEPTIDE: C-Peptide: 1.07 ng/mL (ref 0.80–3.85)

## 2019-03-04 ENCOUNTER — Telehealth: Payer: Self-pay

## 2019-03-04 DIAGNOSIS — Z0279 Encounter for issue of other medical certificate: Secondary | ICD-10-CM

## 2019-03-04 NOTE — Telephone Encounter (Signed)
Pt sent form for PCP to fill out. It is a request for work accommodations due to uncontrolled DM 1.5. Form has been completed, signed, faxed and charged. Pt has has been informed of same.

## 2019-04-01 ENCOUNTER — Ambulatory Visit: Payer: BC Managed Care – PPO | Admitting: Endocrinology

## 2019-04-11 DIAGNOSIS — U071 COVID-19: Secondary | ICD-10-CM | POA: Diagnosis not present

## 2019-04-15 ENCOUNTER — Telehealth: Payer: Self-pay

## 2019-04-15 NOTE — Telephone Encounter (Signed)
New message    The patient calls need an order for lancets to be sent to [facility] on [address] [facility]. Not listed on his medication list.    The patient was diagnosed with COVID on 2/22 wanted to know if there anything he can take or prescribe for muscle soreness in the lower back having trouble sleep.

## 2019-04-16 NOTE — Telephone Encounter (Signed)
LVM for pt to call back as soon as possible.   RE: I will speak to pt when he calls. Please get me.   Brand of lancet and also discuss concerns about being COVID +.

## 2019-04-20 MED ORDER — LANCETS MISC: 1 refills | Status: DC

## 2019-04-20 MED ORDER — PEN NEEDLES 32G X 4 MM MISC: 1.0000 | Freq: Every day | 1 refills | Status: DC

## 2019-04-20 MED ORDER — COOL BLOOD GLUCOSE TEST STRIPS VI STRP: ORAL_STRIP | 12 refills | Status: DC

## 2019-04-20 NOTE — Telephone Encounter (Signed)
Spoke to pt and he stated that he is feeling fine.   Also stated that he needed the pen needles, lancets and test strips.  Erx has been sent as request along with a note to the pharmacy to let us know what is covered by patients insurance in the event that the Contour Next EZ is not covered.

## 2019-04-22 ENCOUNTER — Ambulatory Visit: Payer: BC Managed Care – PPO | Admitting: Endocrinology

## 2019-05-27 ENCOUNTER — Other Ambulatory Visit: Payer: Self-pay | Admitting: Endocrinology

## 2019-05-27 ENCOUNTER — Other Ambulatory Visit: Payer: Self-pay

## 2019-05-27 ENCOUNTER — Telehealth: Payer: Self-pay | Admitting: Endocrinology

## 2019-05-27 ENCOUNTER — Ambulatory Visit (INDEPENDENT_AMBULATORY_CARE_PROVIDER_SITE_OTHER): Payer: BC Managed Care – PPO | Admitting: Endocrinology

## 2019-05-27 ENCOUNTER — Encounter: Payer: Self-pay | Admitting: Endocrinology

## 2019-05-27 VITALS — BP 120/82 | HR 99 | Ht 72.0 in | Wt 243.2 lb

## 2019-05-27 DIAGNOSIS — E119 Type 2 diabetes mellitus without complications: Secondary | ICD-10-CM | POA: Diagnosis not present

## 2019-05-27 DIAGNOSIS — E139 Other specified diabetes mellitus without complications: Secondary | ICD-10-CM

## 2019-05-27 DIAGNOSIS — N529 Male erectile dysfunction, unspecified: Secondary | ICD-10-CM | POA: Diagnosis not present

## 2019-05-27 DIAGNOSIS — Z794 Long term (current) use of insulin: Secondary | ICD-10-CM

## 2019-05-27 DIAGNOSIS — Z9119 Patient's noncompliance with other medical treatment and regimen: Secondary | ICD-10-CM | POA: Diagnosis not present

## 2019-05-27 DIAGNOSIS — B353 Tinea pedis: Secondary | ICD-10-CM

## 2019-05-27 LAB — POCT GLYCOSYLATED HEMOGLOBIN (HGB A1C): Hemoglobin A1C: 12.1 % — AB (ref 4.0–5.6)

## 2019-05-27 MED ORDER — FREESTYLE LANCETS MISC: 1.0000 | Freq: Every day | 0 refills | Status: DC

## 2019-05-27 MED ORDER — CONTOUR NEXT TEST VI STRP: 1.0000 | ORAL_STRIP | Freq: Two times a day (BID) | 3 refills | Status: DC

## 2019-05-27 MED ORDER — FREESTYLE LITE TEST VI STRP: 1.0000 | ORAL_STRIP | Freq: Every day | 0 refills | Status: DC

## 2019-05-27 MED ORDER — SILDENAFIL CITRATE 100 MG PO TABS: 100.0000 mg | ORAL_TABLET | Freq: Every day | ORAL | 11 refills | Status: DC | PRN

## 2019-05-27 MED ORDER — TOUJEO MAX SOLOSTAR 300 UNIT/ML ~~LOC~~ SOPN: 70.0000 [IU] | PEN_INJECTOR | Freq: Every day | SUBCUTANEOUS | 1 refills | Status: DC

## 2019-05-27 MED ORDER — FREESTYLE LITE DEVI: 1.0000 | Freq: Every day | 0 refills | Status: DC

## 2019-05-27 NOTE — Patient Instructions (Addendum)
You should continue to put athlete's foot cream on your feet.  Blood tests are requested for you today.  We'll let you know about the results.  good diet and exercise significantly improve the control of your diabetes.  please let me know if you wish to be referred to a dietician.  high blood sugar is very risky to your health.  you should see an eye doctor and dentist every year.  It is very important to get all recommended vaccinations.  Controlling your blood pressure and cholesterol drastically reduces the damage diabetes does to your body.  Those who smoke should quit.  Please discuss these with your doctor.  check your blood sugar twiec a day.  vary the time of day when you check, between before the 3 meals, and at bedtime.  also check if you have symptoms of your blood sugar being too high or too low.  please keep a record of the readings and bring it to your next appointment here (or you can bring the meter itself).  You can write it on any piece of paper.  please call us sooner if your blood sugar goes below 70, or if you have a lot of readings over 200.  I have sent a prescription to your pharmacy, for strips, and for Viagra. Please increase the insulin to 70 units daily. Please come back for a follow-up appointment in 1 month.

## 2019-05-27 NOTE — Progress Notes (Signed)
Subjective:    Patient ID: Joseph [name], male    DOB: 07-Sep-1980, 39 y.o.   MRN: [id number]  HPI pt is referred by Dr Yetta Barre, for diabetes.  Pt states DM was dx'ed in 2014; he is unaware of any chronic complications; he has been on insulin since dx, but he did not take 2019-2020; pt says his diet and exercise are good; he has never had pancreatitis, pancreatic surgery, severe hypoglycemia or DKA.  Pt says he seldom misses the insulin.  He works 2nd shift.  He does not check cbg's Past Medical History:  Diagnosis Date  . Diabetes mellitus without complication (HCC)    insulin treated type 2  . GERD (gastroesophageal reflux disease)   . Hyperlipidemia   . Hypertension     Past Surgical History:  Procedure Laterality Date  . APPENDECTOMY     attempted removal, too much swelling  . IR RADIOLOGIST EVAL & MGMT  05/14/2016  . LAPAROSCOPIC APPENDECTOMY  06/25/2016  . LAPAROSCOPIC APPENDECTOMY N/A 06/25/2016   Procedure: LAPAROSCOPIC APPENDECTOMY;  Surgeon: Griselda Miner, MD;  Location: [facility] OR;  Service: General;  Laterality: N/A;  . wisdom teeth pulled      Social History   Socioeconomic History  . Marital status: Single    Spouse name: Not on file  . Number of children: Not on file  . Years of education: Not on file  . Highest education level: Not on file  Occupational History  . Not on file  Tobacco Use  . Smoking status: Former Smoker    Quit date: 04/18/2016    Years since quitting: 3.1  . Smokeless tobacco: Never Used  . Tobacco comment: hasnt smoked since march  Substance and Sexual Activity  . Alcohol use: No  . Drug use: No  . Sexual activity: Not Currently  Other Topics Concern  . Not on file  Social History Narrative  . Not on file   Social Determinants of Health   Financial Resource Strain:   . Difficulty of Paying Living Expenses:   Food Insecurity:   . Worried About Programme researcher, broadcasting/film/video in the Last Year:   . Barista in the Last Year:   Transportation  Needs:   . Freight forwarder (Medical):   [name] Kitchen Lack of Transportation (Non-Medical):   Physical Activity:   . Days of Exercise per Week:   . Minutes of Exercise per Session:   Stress:   . Feeling of Stress :   Social Connections:   . Frequency of Communication with Friends and Family:   . Frequency of Social Gatherings with Friends and Family:   . Attends Religious Services:   . Active Member of Clubs or Organizations:   . Attends Banker Meetings:   [name] Kitchen Marital Status:   Intimate Partner Violence:   . Fear of Current or Ex-Partner:   . Emotionally Abused:   [name] Kitchen Physically Abused:   . Sexually Abused:     Current Outpatient Medications on File Prior to Visit  Medication Sig Dispense Refill  . Insulin Pen Needle (PEN NEEDLES) 32G X 4 MM MISC 1 each by Does not apply route daily. Use with insulin 90 each 1   No current facility-administered medications on file prior to visit.    Allergies  Allergen Reactions  . Bee Venom Anaphylaxis    Family History  Problem Relation Age of Onset  . Diabetes Father   . Hypertension Maternal Grandmother   . Cancer  Neg Hx   . Early death Neg Hx   . Heart disease Neg Hx   . Hyperlipidemia Neg Hx   . Kidney disease Neg Hx   . Learning disabilities Neg Hx   . Stroke Neg Hx     BP 120/82   Pulse 99   Ht 6' (1.829 m)   Wt 243 lb 3.2 oz (110.3 kg)   SpO2 95%   BMI 32.98 kg/m     Review of Systems denies weight loss, blurry vision, chest pain, sob, n/v, urinary frequency, and depression. He has ED sxs.       Objective:   Physical Exam VS: see vs page GEN: no distress HEAD: head: no deformity eyes: no periorbital swelling, no proptosis external nose and ears are normal NECK: supple, thyroid is not enlarged CHEST WALL: no deformity LUNGS: clear to auscultation CV: reg rate and rhythm, no murmur MUSCULOSKELETAL: muscle bulk and strength are grossly normal.  no obvious joint swelling.  gait is normal and  steady EXTEMITIES: no deformity.  no ulcer on the feet.  feet are of normal color and temp.  no edema PULSES: dorsalis pedis intact bilat.  no carotid bruit NEURO:  cn 2-12 grossly intact.   readily moves all 4's.  sensation is intact to touch on the feet SKIN:  Normal texture and temperature.  No rash or suspicious lesion is visible.  There is a patchy rash on the feet NODES:  None palpable at the neck PSYCH: alert, well-oriented.  Does not appear anxious nor depressed.    Lab Results  Component Value Date   HGBA1C 12.1 (A) 05/27/2019   I have reviewed outside records, and summarized: Pt was noted to have severely elevated a1c, and referred here.  He also had appendectomy in 2018.  I personally reviewed electrocardiogram tracing (06/20/16): Indication: preop Impression: NSR.  No MI.  No hypertrophy. No comparison is available      Assessment & Plan:  Insulin-requiring type 2 DM: severe exacerbation.  Tinea pedis, new to me. Noncompliance with cbg recording: I'll work around this as best I can  Patient Instructions  You should continue to put athlete's foot cream on your feet.  Blood tests are requested for you today.  We'll let you know about the results.  good diet and exercise significantly improve the control of your diabetes.  please let me know if you wish to be referred to a dietician.  high blood sugar is very risky to your health.  you should see an eye doctor and dentist every year.  It is very important to get all recommended vaccinations.  Controlling your blood pressure and cholesterol drastically reduces the damage diabetes does to your body.  Those who smoke should quit.  Please discuss these with your doctor.  check your blood sugar twiec a day.  vary the time of day when you check, between before the 3 meals, and at bedtime.  also check if you have symptoms of your blood sugar being too high or too low.  please keep a record of the readings and bring it to your next  appointment here (or you can bring the meter itself).  You can write it on any piece of paper.  please call us sooner if your blood sugar goes below 70, or if you have a lot of readings over 200.  I have sent a prescription to your pharmacy, for strips, and for Viagra. Please increase the insulin to 70 units daily. Please come  back for a follow-up appointment in 1 month.

## 2019-05-27 NOTE — Telephone Encounter (Signed)
Rx sent 

## 2019-05-27 NOTE — Telephone Encounter (Signed)
Patient called requesting all prescriptions from today to be resent to:  [facility] [address] Phone # - [phone]

## 2019-05-29 LAB — TESTOSTERONE,FREE AND TOTAL
Testosterone, Free: 9.7 pg/mL (ref 6.8–21.5)
Testosterone: 287 ng/dL (ref 264–916)

## 2019-05-31 ENCOUNTER — Telehealth: Payer: Self-pay

## 2019-05-31 NOTE — Telephone Encounter (Signed)
Called pt and informed him Dr. Everardo All is needing to know what alternative to sildenafil (Viagra) is covered by his plan. States he will call insurance then call back with alternative.

## 2019-05-31 NOTE — Telephone Encounter (Signed)
Alternative is fine with me--I just need to know which one

## 2019-05-31 NOTE — Telephone Encounter (Signed)
Received notification from [facility] indicating Viagra is not covered, needing PA. Routing this message to Dr. Everardo All for him to review and address re: whether Rx should be changed to covered alternative.

## 2019-05-31 NOTE — Telephone Encounter (Signed)
error 

## 2019-06-15 NOTE — Telephone Encounter (Signed)
Again received request for Dr. Everardo All to complete PA. Per Dr. Everardo All, no need to complete PA. Will change to covered alternative. Pt still has not called to inform about a covered alternative. Will continue to await pt response.

## 2019-06-19 ENCOUNTER — Other Ambulatory Visit: Payer: Self-pay | Admitting: Endocrinology

## 2019-06-19 DIAGNOSIS — E139 Other specified diabetes mellitus without complications: Secondary | ICD-10-CM

## 2019-06-20 NOTE — Telephone Encounter (Signed)
Patient called stating his insurance informed him that there was not an alternative medication. Requests a call at ph# [phone]

## 2019-06-20 NOTE — Telephone Encounter (Signed)
Options are [facility]

## 2019-06-20 NOTE — Telephone Encounter (Signed)
Please advise if pt should purchase OOP and/or with [facility]?

## 2019-06-21 ENCOUNTER — Other Ambulatory Visit: Payer: Self-pay

## 2019-06-21 MED ORDER — SILDENAFIL CITRATE 100 MG PO TABS: 100.0000 mg | ORAL_TABLET | Freq: Every day | ORAL | 11 refills | Status: DC | PRN

## 2019-06-21 NOTE — Telephone Encounter (Signed)
Called pt and made him aware of his options. Advised he will need to call the office and inform us [facility] pharmacy he would like to use so that the Rx can be changed to that particular pharmacy AND with notes added stating pt will be purchasing out of pocket, do not bill insurance. Verbalized acceptance and understanding. Will await call back from pt.

## 2019-06-22 ENCOUNTER — Other Ambulatory Visit: Payer: Self-pay

## 2019-06-22 DIAGNOSIS — Z20822 Contact with and (suspected) exposure to covid-19: Secondary | ICD-10-CM | POA: Diagnosis not present

## 2019-06-22 NOTE — Telephone Encounter (Signed)
Please disregard message from earlier today. Patient is going to have refill transferred to [facility] from [place]. He called back to advise that the Rx does not need to be changed

## 2019-06-22 NOTE — Telephone Encounter (Signed)
Patient called to advise that [facility] will only cover 30 tablets at 20 MG per month - not 100 MG (11 tablets) as currently written.  Please call into [facility] on [address]

## 2019-06-22 NOTE — Telephone Encounter (Signed)
Please refer below and disregard previous message

## 2019-06-22 NOTE — Telephone Encounter (Signed)
Please advise if you wish to address tomorrow and provide pt with Rx for him to take to an affordable pharmacy of choice.

## 2019-06-24 ENCOUNTER — Ambulatory Visit: Payer: BC Managed Care – PPO | Admitting: Endocrinology

## 2019-07-05 ENCOUNTER — Other Ambulatory Visit: Payer: Self-pay | Admitting: Internal Medicine

## 2019-07-05 DIAGNOSIS — E139 Other specified diabetes mellitus without complications: Secondary | ICD-10-CM

## 2019-07-07 ENCOUNTER — Telehealth: Payer: Self-pay

## 2019-07-07 NOTE — Telephone Encounter (Signed)
Pt contacted and informed we have samples for him. His name is on the boxes and they are located in the fridge at the old lab spot.

## 2019-07-07 NOTE — Telephone Encounter (Signed)
1.Medication Requested:TOUJEO MAX SOLOSTAR 300 UNIT/ML Solostar Pen  2. Pharmacy (Name, Street, [place]):  3. On Med List:   4. Last Visit with PCP: 12.7.20   5. Next visit date with PCP:   Agent: Please be advised that RX refills may take up to 3 business days. We ask that you follow-up with your pharmacy.

## 2019-07-07 NOTE — Telephone Encounter (Signed)
[facility] on [address]   Yes   Appt on  5.24.2021

## 2019-07-07 NOTE — Telephone Encounter (Signed)
    Patient states he is completely out of his medication Do you have a sample of  TOUJEO MAX SOLOSTAR 300 UNIT/ML Solostar Pen

## 2019-07-11 ENCOUNTER — Ambulatory Visit: Payer: BC Managed Care – PPO | Admitting: Internal Medicine

## 2019-07-15 ENCOUNTER — Ambulatory Visit: Payer: BC Managed Care – PPO | Admitting: Endocrinology

## 2019-07-19 ENCOUNTER — Other Ambulatory Visit: Payer: Self-pay

## 2019-07-19 ENCOUNTER — Ambulatory Visit (INDEPENDENT_AMBULATORY_CARE_PROVIDER_SITE_OTHER): Payer: BC Managed Care – PPO | Admitting: Internal Medicine

## 2019-07-19 ENCOUNTER — Encounter: Payer: Self-pay | Admitting: Internal Medicine

## 2019-07-19 VITALS — BP 124/86 | HR 86 | Temp 98.4°F | Resp 16 | Ht 72.0 in | Wt 249.0 lb

## 2019-07-19 DIAGNOSIS — E139 Other specified diabetes mellitus without complications: Secondary | ICD-10-CM | POA: Diagnosis not present

## 2019-07-19 DIAGNOSIS — I10 Essential (primary) hypertension: Secondary | ICD-10-CM | POA: Diagnosis not present

## 2019-07-19 DIAGNOSIS — E785 Hyperlipidemia, unspecified: Secondary | ICD-10-CM

## 2019-07-19 LAB — POCT GLYCOSYLATED HEMOGLOBIN (HGB A1C): Hemoglobin A1C: 11.4 % — AB (ref 4.0–5.6)

## 2019-07-19 LAB — POCT GLUCOSE (DEVICE FOR HOME USE): Glucose Fasting, POC: 286 mg/dL — AB (ref 70–99)

## 2019-07-19 MED ORDER — INSULIN LISPRO 200 UNIT/ML ~~LOC~~ SOPN: 10.0000 [IU] | PEN_INJECTOR | Freq: Three times a day (TID) | SUBCUTANEOUS | 2 refills | Status: DC

## 2019-07-19 MED ORDER — PEN NEEDLES 32G X 4 MM MISC: 1.0000 | Freq: Four times a day (QID) | 1 refills | Status: DC

## 2019-07-19 MED ORDER — ROSUVASTATIN CALCIUM 20 MG PO TABS: 20.0000 mg | ORAL_TABLET | Freq: Every day | ORAL | 1 refills | Status: DC

## 2019-07-19 MED ORDER — RAMIPRIL 2.5 MG PO CAPS: 2.5000 mg | ORAL_CAPSULE | Freq: Every day | ORAL | 1 refills | Status: DC

## 2019-07-19 NOTE — Progress Notes (Signed)
Subjective:  Patient ID: Joseph [name], male    DOB: 11-01-1980  Age: 39 y.o. MRN: [id number]  CC: Hypertension, Hyperlipidemia, and Diabetes  This visit occurred during the SARS-CoV-2 public health emergency.  Safety protocols were in place, including screening questions prior to the visit, additional usage of staff PPE, and extensive cleaning of exam room while observing appropriate contact time as indicated for disinfecting solutions.    HPI Joseph [name] presents for f/up - He is concerned his blood sugar is not adequately well controlled and he complains of polys.  He says he is somewhat compliant with the basal insulin.  He is not using anything other than basal insulin to control his blood sugars.  He has a follow-up appointment soon with endocrinology.  Outpatient Medications Prior to Visit  Medication Sig Dispense Refill  . Blood Glucose Monitoring Suppl (FREESTYLE LITE) DEVI 1 each by Does not apply route daily. E11.9 1 each 0  . FREESTYLE LITE test strip USE TO TEST DAILY (INS PAYS FOR 30 DAYS) 100 strip 3  . glucose blood (CONTOUR NEXT TEST) test strip 1 each by Other route in the morning and at bedtime. And lancets 2/day 180 each 3  . Lancets (FREESTYLE) lancets 1 each by Other route daily. E11.9 100 each 0  . sildenafil (VIAGRA) 100 MG tablet Take 1 tablet (100 mg total) by mouth daily as needed for erectile dysfunction. DO NOT BILL INSURANCE. WILL PURCHASE OUT OF POCKET (PT TO CALL BACK WITH NAME OF PHARMACY) 10 tablet 11  . Insulin Pen Needle (PEN NEEDLES) 32G X 4 MM MISC 1 each by Does not apply route daily. Use with insulin 90 each 1  . TOUJEO MAX SOLOSTAR 300 UNIT/ML Solostar Pen INJECT 50 UNITS INTO THE SKIN DAILY 6 pen 1   No facility-administered medications prior to visit.    ROS Review of Systems  Constitutional: Positive for unexpected weight change (wt gain). Negative for appetite change, chills, diaphoresis and fatigue.  HENT: Negative.   Eyes: Negative.   Negative for visual disturbance.  Respiratory: Negative for cough, chest tightness, shortness of breath and wheezing.   Cardiovascular: Negative for chest pain, palpitations and leg swelling.  Gastrointestinal: Negative for abdominal pain, diarrhea, nausea and vomiting.  Endocrine: Positive for polydipsia, polyphagia and polyuria.  Genitourinary: Negative.   Musculoskeletal: Negative for myalgias.  Skin: Negative.   Neurological: Negative.  Negative for dizziness, weakness and light-headedness.  Hematological: Negative for adenopathy. Does not bruise/bleed easily.  Psychiatric/Behavioral: Negative.     Objective:  BP 124/86 (BP Location: Left Arm, Patient Position: Sitting, Cuff Size: Large)   Pulse 86   Temp 98.4 F (36.9 C) (Oral)   Resp 16   Ht 6' (1.829 m)   Wt 249 lb (112.9 kg)   SpO2 97%   BMI 33.77 kg/m   BP Readings from Last 3 Encounters:  07/19/19 124/86  05/27/19 120/82  01/24/19 138/88    Wt Readings from Last 3 Encounters:  07/19/19 249 lb (112.9 kg)  05/27/19 243 lb 3.2 oz (110.3 kg)  01/24/19 237 lb (107.5 kg)    Physical Exam Vitals reviewed.  Constitutional:      General: He is not in acute distress.    Appearance: He is obese. He is not ill-appearing or diaphoretic.  HENT:     Nose: Nose normal.     Mouth/Throat:     Mouth: Mucous membranes are moist.  Eyes:     General: No scleral icterus.  Conjunctiva/sclera: Conjunctivae normal.  Cardiovascular:     Rate and Rhythm: Normal rate and regular rhythm.     Heart sounds: No murmur.  Pulmonary:     Effort: Pulmonary effort is normal.     Breath sounds: No stridor. No wheezing, rhonchi or rales.  Abdominal:     General: Abdomen is flat.     Palpations: Abdomen is soft. There is no hepatomegaly, splenomegaly or mass.     Tenderness: There is no abdominal tenderness.  Musculoskeletal:        General: Normal range of motion.     Cervical back: Neck supple.     Right lower leg: No edema.      Left lower leg: No edema.  Lymphadenopathy:     Cervical: No cervical adenopathy.  Skin:    General: Skin is warm and dry.     Coloration: Skin is not pale.  Neurological:     General: No focal deficit present.     Mental Status: He is alert.     Lab Results  Component Value Date   WBC 5.1 01/24/2019   HGB 15.5 01/24/2019   HCT 46.3 01/24/2019   PLT 253.0 01/24/2019   GLUCOSE 228 (H) 01/24/2019   CHOL 240 (H) 01/24/2019   TRIG 111.0 01/24/2019   HDL 39.40 01/24/2019   LDLCALC 178 (H) 01/24/2019   ALT 27 01/24/2019   AST 16 01/24/2019   NA 138 01/24/2019   K 4.0 01/24/2019   CL 103 01/24/2019   CREATININE 0.83 01/24/2019   BUN 20 01/24/2019   CO2 25 01/24/2019   TSH 1.14 01/24/2019   INR 1.00 05/02/2016   HGBA1C 11.4 (A) 07/19/2019   MICROALBUR 4.9 (H) 01/24/2019    No results found.  Assessment & Plan:   Joseph [name] was seen today for hypertension, hyperlipidemia and diabetes.  Diagnoses and all orders for this visit:  Diabetes 1.5, managed as type 1 (HCC)- He has not achieved adequate glycemic control and his A1c is at 11.4%.  I recommended that he increase his dose of basal insulin and to add short acting insulin with each meal. -     ramipril (ALTACE) 2.5 MG capsule; Take 1 capsule (2.5 mg total) by mouth daily. -     insulin lispro (HUMALOG) 200 UNIT/ML KwikPen; Inject 10 Units into the skin 3 (three) times daily with meals. -     Insulin Pen Needle (PEN NEEDLES) 32G X 4 MM MISC; 1 each by Does not apply route in the morning, at noon, in the evening, and at bedtime. Use with insulin -     Amb Referral to Nutrition and Diabetic E -     POCT glycosylated hemoglobin (Hb A1C) -     POCT Glucose (Device for Home Use) -     insulin glargine, 2 Unit Dial, (TOUJEO MAX SOLOSTAR) 300 UNIT/ML Solostar Pen; Inject 80 Units into the skin daily.  Hyperlipidemia with target LDL less than 100- I have asked him to take a statin for CV risk reduction. -     rosuvastatin (CRESTOR)  20 MG tablet; Take 1 tablet (20 mg total) by mouth daily.  Essential hypertension- I recommended that he take an ACE inhibitor for renal protection. -     ramipril (ALTACE) 2.5 MG capsule; Take 1 capsule (2.5 mg total) by mouth daily.   I have changed Joseph [name]'s Pen Needles and [employer] I am also having him start on rosuvastatin, ramipril, and insulin lispro. Additionally, I  am having him maintain his freestyle, Contour Next Test, FreeStyle Lite, FREESTYLE LITE, and sildenafil.  Meds ordered this encounter  Medications  . rosuvastatin (CRESTOR) 20 MG tablet    Sig: Take 1 tablet (20 mg total) by mouth daily.    Dispense:  90 tablet    Refill:  1  . ramipril (ALTACE) 2.5 MG capsule    Sig: Take 1 capsule (2.5 mg total) by mouth daily.    Dispense:  90 capsule    Refill:  1  . insulin lispro (HUMALOG) 200 UNIT/ML KwikPen    Sig: Inject 10 Units into the skin 3 (three) times daily with meals.    Dispense:  9 mL    Refill:  2  . Insulin Pen Needle (PEN NEEDLES) 32G X 4 MM MISC    Sig: 1 each by Does not apply route in the morning, at noon, in the evening, and at bedtime. Use with insulin    Dispense:  400 each    Refill:  1  . insulin glargine, 2 Unit Dial, (TOUJEO MAX SOLOSTAR) 300 UNIT/ML Solostar Pen    Sig: Inject 80 Units into the skin daily.    Dispense:  6 pen    Refill:  1     Follow-up: Return in about 6 months (around 01/18/2020).  Sanda Linger, MD

## 2019-07-19 NOTE — Patient Instructions (Signed)
Type 2 Diabetes Mellitus, Diagnosis, Adult Type 2 diabetes (type 2 diabetes mellitus) is a long-term (chronic) disease. In type 2 diabetes, one or both of these problems may be present:  The pancreas does not make enough of a hormone called insulin.  Cells in the body do not respond properly to insulin that the body makes (insulin resistance). Normally, insulin allows blood sugar (glucose) to enter cells in the body. The cells use glucose for energy. Insulin resistance or lack of insulin causes excess glucose to build up in the blood instead of going into cells. As a result, high blood glucose (hyperglycemia) develops. What increases the risk? The following factors may make you more likely to develop type 2 diabetes:  Having a family member with type 2 diabetes.  Being overweight or obese.  Having an inactive (sedentary) lifestyle.  Having been diagnosed with insulin resistance.  Having a history of prediabetes, gestational diabetes, or polycystic ovary syndrome (PCOS).  Being of American-Indian, African-American, Hispanic/Latino, or Asian/Pacific Islander descent. What are the signs or symptoms? In the early stage of this condition, you may not have symptoms. Symptoms develop slowly and may include:  Increased thirst (polydipsia).  Increased hunger(polyphagia).  Increased urination (polyuria).  Increased urination during the night (nocturia).  Unexplained weight loss.  Frequent infections that keep coming back (recurring).  Fatigue.  Weakness.  Vision changes, such as blurry vision.  Cuts or bruises that are slow to heal.  Tingling or numbness in the hands or feet.  Dark patches on the skin (acanthosis nigricans). How is this diagnosed? This condition is diagnosed based on your symptoms, your medical history, a physical exam, and your blood glucose level. Your blood glucose may be checked with one or more of the following blood tests:  A fasting blood glucose (FBG)  test. You will not be allowed to eat (you will fast) for 8 hours or longer before a blood sample is taken.  A random blood glucose test. This test checks blood glucose at any time of day regardless of when you ate.  An A1c (hemoglobin A1c) blood test. This test provides information about blood glucose control over the previous 2-3 months.  An oral glucose tolerance test (OGTT). This test measures your blood glucose at two times: ? After fasting. This is your baseline blood glucose level. ? Two hours after drinking a beverage that contains glucose. You may be diagnosed with type 2 diabetes if:  Your FBG level is 126 mg/dL (7.0 mmol/L) or higher.  Your random blood glucose level is 200 mg/dL (11.1 mmol/L) or higher.  Your A1c level is 6.5% or higher.  Your OGTT result is higher than 200 mg/dL (11.1 mmol/L). These blood tests may be repeated to confirm your diagnosis. How is this treated? Your treatment may be managed by a specialist called an endocrinologist. Type 2 diabetes may be treated by following instructions from your health care provider about:  Making diet and lifestyle changes. This may include: ? Following an individualized nutrition plan that is developed by a diet and nutrition specialist (registered dietitian). ? Exercising regularly. ? Finding ways to manage stress.  Checking your blood glucose level as often as told.  Taking diabetes medicines or insulin daily. This helps to keep your blood glucose levels in the healthy range. ? If you use insulin, you may need to adjust the dosage depending on how physically active you are and what foods you eat. Your health care provider will tell you how to adjust your dosage.    Taking medicines to help prevent complications from diabetes, such as: ? Aspirin. ? Medicine to lower cholesterol. ? Medicine to control blood pressure. Your health care provider will set individualized treatment goals for you. Your goals will be based on  your age, other medical conditions you have, and how you respond to diabetes treatment. Generally, the goal of treatment is to maintain the following blood glucose levels:  Before meals (preprandial): 80-130 mg/dL (4.4-7.2 mmol/L).  After meals (postprandial): below 180 mg/dL (10 mmol/L).  A1c level: less than 7%. Follow these instructions at home: Questions to ask your health care provider  Consider asking the following questions: ? Do I need to meet with a diabetes educator? ? Where can I find a support group for people with diabetes? ? What equipment will I need to manage my diabetes at home? ? What diabetes medicines do I need, and when should I take them? ? How often do I need to check my blood glucose? ? What number can I call if I have questions? ? When is my next appointment? General instructions  Take over-the-counter and prescription medicines only as told by your health care provider.  Keep all follow-up visits as told by your health care provider. This is important.  For more information about diabetes, visit: ? American Diabetes Association (ADA): www.diabetes.org ? American Association of Diabetes Educators (AADE): www.diabeteseducator.org Contact a health care provider if:  Your blood glucose is at or above 240 mg/dL (13.3 mmol/L) for 2 days in a row.  You have been sick or have had a fever for 2 days or longer, and you are not getting better.  You have any of the following problems for more than 6 hours: ? You cannot eat or drink. ? You have nausea and vomiting. ? You have diarrhea. Get help right away if:  Your blood glucose is lower than 54 mg/dL (3.0 mmol/L).  You become confused or you have trouble thinking clearly.  You have difficulty breathing.  You have moderate or large ketone levels in your urine. Summary  Type 2 diabetes (type 2 diabetes mellitus) is a long-term (chronic) disease. In type 2 diabetes, the pancreas does not make enough of a  hormone called insulin, or cells in the body do not respond properly to insulin that the body makes (insulin resistance).  This condition is treated by making diet and lifestyle changes and taking diabetes medicines or insulin.  Your health care provider will set individualized treatment goals for you. Your goals will be based on your age, other medical conditions you have, and how you respond to diabetes treatment.  Keep all follow-up visits as told by your health care provider. This is important. This information is not intended to replace advice given to you by your health care provider. Make sure you discuss any questions you have with your health care provider. Document Revised: 04/03/2017 Document Reviewed: 03/09/2015 Elsevier Patient Education  2020 [employer] Inc[employer]  

## 2019-07-20 MED ORDER — TOUJEO MAX SOLOSTAR 300 UNIT/ML ~~LOC~~ SOPN: 80.0000 [IU] | PEN_INJECTOR | Freq: Every day | SUBCUTANEOUS | 1 refills | Status: DC

## 2019-08-05 NOTE — Telephone Encounter (Signed)
   Patient states insulin glargine, 2 Unit Dial, (TOUJEO MAX SOLOSTAR) 300 UNIT/ML Solostar Pen to costly( $305)

## 2019-08-07 ENCOUNTER — Other Ambulatory Visit: Payer: Self-pay | Admitting: Internal Medicine

## 2019-08-07 DIAGNOSIS — E139 Other specified diabetes mellitus without complications: Secondary | ICD-10-CM

## 2019-08-07 MED ORDER — INSULIN DETEMIR 100 UNIT/ML FLEXPEN: 80.0000 [IU] | PEN_INJECTOR | Freq: Every day | SUBCUTANEOUS | 1 refills | Status: DC

## 2019-08-08 ENCOUNTER — Ambulatory Visit (INDEPENDENT_AMBULATORY_CARE_PROVIDER_SITE_OTHER): Payer: BC Managed Care – PPO | Admitting: Internal Medicine

## 2019-08-08 ENCOUNTER — Other Ambulatory Visit: Payer: Self-pay

## 2019-08-08 ENCOUNTER — Encounter: Payer: Self-pay | Admitting: Internal Medicine

## 2019-08-08 VITALS — BP 138/76 | HR 90 | Temp 98.6°F | Ht 72.0 in | Wt 254.2 lb

## 2019-08-08 DIAGNOSIS — Z202 Contact with and (suspected) exposure to infections with a predominantly sexual mode of transmission: Secondary | ICD-10-CM

## 2019-08-08 DIAGNOSIS — N342 Other urethritis: Secondary | ICD-10-CM

## 2019-08-08 LAB — POC URINALSYSI DIPSTICK (AUTOMATED)
Bilirubin, UA: NEGATIVE
Blood, UA: NEGATIVE
Glucose, UA: POSITIVE — AB
Ketones, UA: NEGATIVE
Leukocytes, UA: NEGATIVE
Nitrite, UA: NEGATIVE
Protein, UA: NEGATIVE
Spec Grav, UA: 1.015 (ref 1.010–1.025)
Urobilinogen, UA: 0.2 E.U./dL
pH, UA: 6.5 (ref 5.0–8.0)

## 2019-08-08 MED ORDER — AZITHROMYCIN 1 G PO PACK: 1.0000 g | PACK | Freq: Once | ORAL | 0 refills | Status: AC

## 2019-08-08 NOTE — Progress Notes (Signed)
Subjective:  Patient ID: Joseph [name], male    DOB: 10-17-1980  Age: 39 y.o. MRN: [id number]  CC: Exposure to STD  This visit occurred during the SARS-CoV-2 public health emergency.  Safety protocols were in place, including screening questions prior to the visit, additional usage of staff PPE, and extensive cleaning of exam room while observing appropriate contact time as indicated for disinfecting solutions.    HPI Joseph [name] presents for concerns about exposure to chlamydia.  He has had a new girlfriend for the last month and he tells me that she was diagnosed and treated for chlamydia earlier today.  He has had some stinging sensations of the tip of his penis but denies discharge, lymphadenopathy, rash, testicular pain or swelling.  Outpatient Medications Prior to Visit  Medication Sig Dispense Refill  . Blood Glucose Monitoring Suppl (FREESTYLE LITE) DEVI 1 each by Does not apply route daily. E11.9 1 each 0  . FREESTYLE LITE test strip USE TO TEST DAILY (INS PAYS FOR 30 DAYS) 100 strip 3  . glucose blood (CONTOUR NEXT TEST) test strip 1 each by Other route in the morning and at bedtime. And lancets 2/day 180 each 3  . insulin detemir (LEVEMIR) 100 UNIT/ML FlexPen Inject 80 Units into the skin daily. 15 mL 1  . insulin lispro (HUMALOG) 200 UNIT/ML KwikPen Inject 10 Units into the skin 3 (three) times daily with meals. 9 mL 2  . Insulin Pen Needle (PEN NEEDLES) 32G X 4 MM MISC 1 each by Does not apply route in the morning, at noon, in the evening, and at bedtime. Use with insulin 400 each 1  . Lancets (FREESTYLE) lancets 1 each by Other route daily. E11.9 100 each 0  . ramipril (ALTACE) 2.5 MG capsule Take 1 capsule (2.5 mg total) by mouth daily. 90 capsule 1  . rosuvastatin (CRESTOR) 20 MG tablet Take 1 tablet (20 mg total) by mouth daily. 90 tablet 1  . sildenafil (VIAGRA) 100 MG tablet Take 1 tablet (100 mg total) by mouth daily as needed for erectile dysfunction. DO NOT BILL  INSURANCE. WILL PURCHASE OUT OF POCKET (PT TO CALL BACK WITH NAME OF PHARMACY) 10 tablet 11   No facility-administered medications prior to visit.    ROS Review of Systems  Constitutional: Negative.  Negative for chills, fatigue and fever.  HENT: Negative.  Negative for sore throat.   Eyes: Negative.   Respiratory: Negative for cough, chest tightness, shortness of breath and wheezing.   Cardiovascular: Negative for chest pain, palpitations and leg swelling.  Gastrointestinal: Negative for abdominal pain, constipation, diarrhea, nausea and vomiting.  Genitourinary: Positive for penile pain. Negative for difficulty urinating, discharge, dysuria, flank pain, frequency, genital sores, hematuria, penile swelling, testicular pain and urgency.  Musculoskeletal: Negative.  Negative for arthralgias and myalgias.  Skin: Negative.  Negative for color change and pallor.  Neurological: Negative.  Negative for dizziness and weakness.  Hematological: Negative for adenopathy. Does not bruise/bleed easily.  Psychiatric/Behavioral: Negative.     Objective:  BP 138/76 (BP Location: Left Arm, Patient Position: Sitting, Cuff Size: Large)   Pulse 90   Temp 98.6 F (37 C) (Oral)   Ht 6' (1.829 m)   Wt 254 lb 4 oz (115.3 kg)   SpO2 96%   BMI 34.48 kg/m   BP Readings from Last 3 Encounters:  08/08/19 138/76  07/19/19 124/86  05/27/19 120/82    Wt Readings from Last 3 Encounters:  08/08/19 254 lb 4 oz (115.3 kg)  07/19/19 249 lb (112.9 kg)  05/27/19 243 lb 3.2 oz (110.3 kg)    Physical Exam Vitals reviewed.  Constitutional:      Appearance: Normal appearance.  HENT:     Nose: Nose normal.     Mouth/Throat:     Mouth: Mucous membranes are moist.  Eyes:     General: No scleral icterus.    Conjunctiva/sclera: Conjunctivae normal.  Cardiovascular:     Rate and Rhythm: Normal rate and regular rhythm.     Heart sounds: No murmur heard.   Pulmonary:     Effort: Pulmonary effort is normal.       Breath sounds: No stridor. No wheezing, rhonchi or rales.  Abdominal:     General: Abdomen is protuberant. There is no distension.     Palpations: Abdomen is soft. There is no hepatomegaly, splenomegaly or mass.     Hernia: No hernia is present.  Genitourinary:    Pubic Area: No rash.      Penis: Normal. No discharge, swelling or lesions.      Testes: Normal.        Right: Mass not present.        Left: Mass not present.     Epididymis:     Right: Normal.     Left: Normal.  Musculoskeletal:     Cervical back: Neck supple.  Lymphadenopathy:     Cervical: No cervical adenopathy.     Lower Body: No left inguinal adenopathy.  Neurological:     Mental Status: He is alert.     Lab Results  Component Value Date   WBC 5.1 01/24/2019   HGB 15.5 01/24/2019   HCT 46.3 01/24/2019   PLT 253.0 01/24/2019   GLUCOSE 228 (H) 01/24/2019   CHOL 240 (H) 01/24/2019   TRIG 111.0 01/24/2019   HDL 39.40 01/24/2019   LDLCALC 178 (H) 01/24/2019   ALT 27 01/24/2019   AST 16 01/24/2019   NA 138 01/24/2019   K 4.0 01/24/2019   CL 103 01/24/2019   CREATININE 0.83 01/24/2019   BUN 20 01/24/2019   CO2 25 01/24/2019   TSH 1.14 01/24/2019   INR 1.00 05/02/2016   HGBA1C 11.4 (A) 07/19/2019   MICROALBUR 4.9 (H) 01/24/2019    No results found.  Assessment & Plan:   Joseph [name] was seen today for exposure to std.  Diagnoses and all orders for this visit:  Exposure to chlamydia -     GC/Chlamydia Probe Amp; Future -     azithromycin (ZITHROMAX) 1 g powder; Take 1 packet (1 g total) by mouth once for 1 dose. -     GC/Chlamydia Probe Amp  Urethritis- Will screen for GC and chlamydia and will empirically treat with a 1 g dose of azithromycin. -     GC/Chlamydia Probe Amp; Future -     azithromycin (ZITHROMAX) 1 g powder; Take 1 packet (1 g total) by mouth once for 1 dose. -     POCT Urinalysis Dipstick (Automated) -     GC/Chlamydia Probe Amp   I am having Joseph [name] start on  azithromycin. I am also having him maintain his freestyle, Contour Next Test, FreeStyle Lite, FREESTYLE LITE, sildenafil, rosuvastatin, ramipril, insulin lispro, Pen Needles, and insulin detemir.  Meds ordered this encounter  Medications  . azithromycin (ZITHROMAX) 1 g powder    Sig: Take 1 packet (1 g total) by mouth once for 1 dose.    Dispense:  1 each    Refill:  0     Follow-up: Return in about 3 weeks (around 08/29/2019).  Sanda Linger, MD

## 2019-08-08 NOTE — Patient Instructions (Signed)
Urethritis, Adult  Urethritis is swelling (inflammation) of the urethra. The urethra is the tube that drains urine from the bladder. It is important to get treatment for this condition early. Delayed treatment may lead to complications. What are the causes? This condition may be caused by:  Germs that are spread through sexual contact. This is the leading cause of urethritis. This may include bacterial or viral infections.  Injury to the urethra. Injury can happen after a thin, flexible tube (catheter) is inserted into the urethra to drain urine, or after medical instruments or foreign bodies are inserted into the area.  Chemical irritation. This may include contact with spermicide.  A disease that causes inflammation. This is rare. What increases the risk? The following factors may make you more likely to develop this condition:  Having sex without using a condom.  Having multiple sexual partners.  Having poor hygiene. What are the signs or symptoms? Symptoms of this condition include:  Pain with urination.  Frequent urination.  Urgent need to urinate.  Itching and pain in the vagina or penis.  Discharge coming from the penis. However, women rarely have symptoms. How is this diagnosed? This condition is diagnosed based on your medical history and symptoms as well as a physical exam. Tests may also be done. These may include:  Urine tests.  Swabs from the urethra. How is this treated? Treatment for this condition depends on the cause. Urethritis caused by a bacterial infection is treated with antibiotic medicine. Any sexual partners must also be treated. Follow these instructions at home: Medicines  Take over-the-counter and prescription medicines only as told by your health care provider.  If you were prescribed antibiotic medicine, take it as told by your health care provider. Do not stop taking the antibiotic even if you start to feel better. Lifestyle  Avoid  using perfumed soaps, bubble bath, and shampoo when you bathe or shower. Rinse the vaginal area after bathing.  Wear cotton underwear. Not wearing underwear when going to bed can help.  Make sure to wipe from front to back after using the toilet if you are male.  Do not have sex until your health care provider approves. When you do have sex, be sure to practice safe sex.  Tell anyone with whom you have had sexual relations in the past 60 days that he or she may be at risk of infection. General instructions  Drink enough fluid to keep your urine clear or pale yellow.  It is up to you to get your test results. Ask your health care provider, or the department that is doing the test, when your results will be ready.  Keep all follow-up visits as told by your health care provider. This is important.  Get tested again 3 months after treatment to make sure the infection is gone. It is important that your sexual partner also gets tested again. Contact a health care provider if:  Your symptoms have not improved after 3 days.  Your symptoms get worse.  You have eye redness or pain.  You develop abdominal pain or pelvic pain (in females).  You develop joint pain.  You have a fever. Get help right away if:  You have severe pain in the belly, back, or side.  You vomit repeatedly. Summary  Urethritis is a swelling (inflammation) of the urethra.  This condition is caused by germs that are spread through sexual contact. This is the main cause of this illness.  It is important to get   treatment for this condition early. Delayed treatment may lead to complications.  Treatment for this condition depends on the cause. Any sexual partners must also be treated. This information is not intended to replace advice given to you by your health care provider. Make sure you discuss any questions you have with your health care provider. Document Revised: 01/16/2017 Document Reviewed:  03/11/2016 Elsevier Patient Education  2020 [employer] Inc.  

## 2019-08-09 ENCOUNTER — Ambulatory Visit: Payer: BC Managed Care – PPO | Admitting: Nutrition

## 2019-08-09 LAB — GC/CHLAMYDIA PROBE AMP
Chlamydia trachomatis, NAA: NEGATIVE
Neisseria Gonorrhoeae by PCR: NEGATIVE

## 2019-08-12 ENCOUNTER — Telehealth: Payer: Self-pay | Admitting: Internal Medicine

## 2019-08-12 DIAGNOSIS — E139 Other specified diabetes mellitus without complications: Secondary | ICD-10-CM

## 2019-08-12 MED ORDER — INSULIN LISPRO 200 UNIT/ML ~~LOC~~ SOPN: 10.0000 [IU] | PEN_INJECTOR | Freq: Three times a day (TID) | SUBCUTANEOUS | 2 refills | Status: DC

## 2019-08-12 NOTE — Telephone Encounter (Signed)
New message:   1.Medication Requested: insulin lispro (HUMALOG) 200 UNIT/ML KwikPen 2. Pharmacy (Name, Street, [place]): [id number] - [place] ([place]), [place] - [address] 3. On Med List: Yes  4. Last Visit with PCP: 08/08/19  5. Next visit date with PCP: none   Agent: Please be advised that RX refills may take up to 3 business days. We ask that you follow-up with your pharmacy.

## 2019-08-12 NOTE — Telephone Encounter (Signed)
erx sent to pharmacy as requested.  °

## 2019-08-16 ENCOUNTER — Other Ambulatory Visit: Payer: Self-pay

## 2019-08-16 ENCOUNTER — Encounter: Payer: Self-pay | Admitting: Endocrinology

## 2019-08-16 ENCOUNTER — Ambulatory Visit (INDEPENDENT_AMBULATORY_CARE_PROVIDER_SITE_OTHER): Payer: BC Managed Care – PPO | Admitting: Endocrinology

## 2019-08-16 ENCOUNTER — Ambulatory Visit: Payer: BC Managed Care – PPO | Admitting: Endocrinology

## 2019-08-16 DIAGNOSIS — E139 Other specified diabetes mellitus without complications: Secondary | ICD-10-CM

## 2019-08-16 MED ORDER — INSULIN DETEMIR 100 UNIT/ML FLEXPEN: 100.0000 [IU] | PEN_INJECTOR | SUBCUTANEOUS | 11 refills | Status: DC

## 2019-08-16 NOTE — Progress Notes (Signed)
Subjective:    Patient ID: Joseph [name], male    DOB: 05/26/1980, 39 y.o.   MRN: [id number]  HPI Pt returns for f/u of diabetes mellitus: DM type:  Dx'ed: 2014 Complications: none Therapy: insulin since dx, but he did not take 2019-2020 DKA: never Severe hypoglycemia: never Pancreatitis: never Pancreatic imaging: normal on 2018 CT SDOH: He works 2nd shift. Other: he takes qd insulin, at least for now.  Interval history: Pt says he takes Toujeo, 70 units each morning.  Pt says he seldom has hypoglycemia, and these episodes are mild.  He had tremor when he took humalog, but did not eat.  Past Medical History:  Diagnosis Date  . Diabetes mellitus without complication (HCC)    insulin treated type 2  . GERD (gastroesophageal reflux disease)   . Hyperlipidemia   . Hypertension     Past Surgical History:  Procedure Laterality Date  . APPENDECTOMY     attempted removal, too much swelling  . IR RADIOLOGIST EVAL & MGMT  05/14/2016  . LAPAROSCOPIC APPENDECTOMY  06/25/2016  . LAPAROSCOPIC APPENDECTOMY N/A 06/25/2016   Procedure: LAPAROSCOPIC APPENDECTOMY;  Surgeon: Griselda Miner, MD;  Location: [facility] OR;  Service: General;  Laterality: N/A;  . wisdom teeth pulled      Social History   Socioeconomic History  . Marital status: Single    Spouse name: Not on file  . Number of children: Not on file  . Years of education: Not on file  . Highest education level: Not on file  Occupational History  . Not on file  Tobacco Use  . Smoking status: Former Smoker    Quit date: 04/18/2016    Years since quitting: 3.3  . Smokeless tobacco: Never Used  . Tobacco comment: hasnt smoked since march  Vaping Use  . Vaping Use: Never used  Substance and Sexual Activity  . Alcohol use: No  . Drug use: No  . Sexual activity: Not Currently  Other Topics Concern  . Not on file  Social History Narrative  . Not on file   Social Determinants of Health   Financial Resource Strain:   . Difficulty  of Paying Living Expenses:   Food Insecurity:   . Worried About Programme researcher, broadcasting/film/video in the Last Year:   . Barista in the Last Year:   Transportation Needs:   . Freight forwarder (Medical):   [name] Kitchen Lack of Transportation (Non-Medical):   Physical Activity:   . Days of Exercise per Week:   . Minutes of Exercise per Session:   Stress:   . Feeling of Stress :   Social Connections:   . Frequency of Communication with Friends and Family:   . Frequency of Social Gatherings with Friends and Family:   . Attends Religious Services:   . Active Member of Clubs or Organizations:   . Attends Banker Meetings:   [name] Kitchen Marital Status:   Intimate Partner Violence:   . Fear of Current or Ex-Partner:   . Emotionally Abused:   [name] Kitchen Physically Abused:   . Sexually Abused:     Current Outpatient Medications on File Prior to Visit  Medication Sig Dispense Refill  . Blood Glucose Monitoring Suppl (FREESTYLE LITE) DEVI 1 each by Does not apply route daily. E11.9 1 each 0  . FREESTYLE LITE test strip USE TO TEST DAILY (INS PAYS FOR 30 DAYS) 100 strip 3  . glucose blood (CONTOUR NEXT TEST) test strip 1 each by  Other route in the morning and at bedtime. And lancets 2/day 180 each 3  . Insulin Pen Needle (PEN NEEDLES) 32G X 4 MM MISC 1 each by Does not apply route in the morning, at noon, in the evening, and at bedtime. Use with insulin 400 each 1  . Lancets (FREESTYLE) lancets 1 each by Other route daily. E11.9 100 each 0  . ramipril (ALTACE) 2.5 MG capsule Take 1 capsule (2.5 mg total) by mouth daily. 90 capsule 1  . rosuvastatin (CRESTOR) 20 MG tablet Take 1 tablet (20 mg total) by mouth daily. 90 tablet 1  . sildenafil (VIAGRA) 100 MG tablet Take 1 tablet (100 mg total) by mouth daily as needed for erectile dysfunction. DO NOT BILL INSURANCE. WILL PURCHASE OUT OF POCKET (PT TO CALL BACK WITH NAME OF PHARMACY) 10 tablet 11   No current facility-administered medications on file prior to  visit.    Allergies  Allergen Reactions  . Bee Venom Anaphylaxis    Family History  Problem Relation Age of Onset  . Diabetes Father   . Hypertension Maternal Grandmother   . Cancer Neg Hx   . Early death Neg Hx   . Heart disease Neg Hx   . Hyperlipidemia Neg Hx   . Kidney disease Neg Hx   . Learning disabilities Neg Hx   . Stroke Neg Hx     BP 132/82   Pulse (!) 105   Ht 6' (1.829 m)   Wt 247 lb 9.6 oz (112.3 kg)   SpO2 98%   BMI 33.58 kg/m    Review of Systems Denies LOC    Objective:   Physical Exam VITAL SIGNS:  See vs page GENERAL: no distress Pulses: dorsalis pedis intact bilat.   MSK: no deformity of the feet CV: no leg edema Skin:  no ulcer on the feet.  normal color and temp on the feet. Neuro: sensation is intact to touch on the feet.     Lab Results  Component Value Date   HGBA1C 11.4 (A) 07/19/2019       Assessment & Plan:  Insulin-requiring type 2 DM: poor glycemic control Noncompliance with timing humalog: He does not seem to be a candidate for multiple daily injections.   Hypoglycemia, due to insulin: this limits aggressiveness of glycemic control.   Patient Instructions  check your blood sugar twiec a day.  vary the time of day when you check, between before the 3 meals, and at bedtime.  also check if you have symptoms of your blood sugar being too high or too low.  please keep a record of the readings and bring it to your next appointment here (or you can bring the meter itself).  You can write it on any piece of paper.  please call us sooner if your blood sugar goes below 70, or if you have a lot of readings over 200.   I have sent a prescription to your pharmacy, to take Levemir, 100 units each morning.  This will be your only insulin. Please come back for a follow-up appointment in 1 month.

## 2019-08-16 NOTE — Patient Instructions (Addendum)
check your blood sugar twiec a day.  vary the time of day when you check, between before the 3 meals, and at bedtime.  also check if you have symptoms of your blood sugar being too high or too low.  please keep a record of the readings and bring it to your next appointment here (or you can bring the meter itself).  You can write it on any piece of paper.  please call us sooner if your blood sugar goes below 70, or if you have a lot of readings over 200.   I have sent a prescription to your pharmacy, to take Levemir, 100 units each morning.  This will be your only insulin. Please come back for a follow-up appointment in 1 month.

## 2019-08-23 ENCOUNTER — Ambulatory Visit: Payer: BC Managed Care – PPO | Admitting: Nutrition

## 2019-09-05 ENCOUNTER — Telehealth: Payer: Self-pay | Admitting: Internal Medicine

## 2019-09-05 NOTE — Telephone Encounter (Signed)
New Message:   Pt is calling and states he is needing to know what the name of his long lasting insulin is. He states he may be changing pharmacies but needs to know the name to see if they carry the medication. Please advise.

## 2019-09-05 NOTE — Telephone Encounter (Signed)
Pt contacted and informed Levemir is the name of the insulin.

## 2019-09-23 ENCOUNTER — Ambulatory Visit: Payer: BC Managed Care – PPO | Admitting: Endocrinology

## 2019-11-15 ENCOUNTER — Telehealth: Payer: Self-pay | Admitting: Internal Medicine

## 2019-11-15 NOTE — Telephone Encounter (Signed)
Patient is calling requesting If we have any samples of the following medication. He states he needs enough to get him through the next 10 days until he gets paid and can pick up his RX.  Insulin Pen Needle (PEN NEEDLES) 32G X 4 MM MISC

## 2019-12-05 NOTE — Progress Notes (Signed)
Virtual Visit via telephone Note  I connected with Joseph [name] on 12/05/19 at 11:15 AM EDT by telephone and verified that I am speaking with the correct person using two identifiers.   I discussed the limitations of evaluation and management by telemedicine and the availability of in person appointments. The patient expressed understanding and agreed to proceed.  Present for the visit:  Myself, Dr Cheryll Cockayne, Cheron Every.  The patient is currently at home and I am in the office.    No referring provider.    History of Present Illness: This is an acute visit for congestion, ST  His symptoms started Sunday, 2 days ago.  He states he typically gets something like this when the weather shifts.  He has been vaccinated against Covid, but recently his son was exposed at school and he did get tested within the past hour.  He is waiting for his results to come back.  He did have Covid and he wants to make sure this is negative.  He states his sore throat has resolved.  He does have lower back and leg achiness.  He states cough, wheeze, shortness of breath.  His cough is dry.  He has some mild sinus pain, dizziness and headaches.  He does have seasonal allergies and is not taking anything at this time.  While we are on the phone his Covid test came back negative.  He thinks he may just have a flare of allergies.    Review of Systems  Constitutional: Positive for chills. Negative for fever.  HENT: Positive for congestion, sinus pain (mild) and sore throat. Negative for ear pain.   Respiratory: Positive for cough, shortness of breath and wheezing. Negative for sputum production.   Cardiovascular: Negative for chest pain.  Gastrointestinal: Negative for diarrhea and nausea.  Musculoskeletal: Positive for myalgias.  Neurological: Positive for dizziness and headaches.      Social History   Socioeconomic History  . Marital status: Single    Spouse name: Not on file  . Number of children:  Not on file  . Years of education: Not on file  . Highest education level: Not on file  Occupational History  . Not on file  Tobacco Use  . Smoking status: Former Smoker    Quit date: 04/18/2016    Years since quitting: 3.6  . Smokeless tobacco: Never Used  . Tobacco comment: hasnt smoked since march  Vaping Use  . Vaping Use: Never used  Substance and Sexual Activity  . Alcohol use: No  . Drug use: No  . Sexual activity: Not Currently  Other Topics Concern  . Not on file  Social History Narrative  . Not on file   Social Determinants of Health   Financial Resource Strain:   . Difficulty of Paying Living Expenses: Not on file  Food Insecurity:   . Worried About Programme researcher, broadcasting/film/video in the Last Year: Not on file  . Ran Out of Food in the Last Year: Not on file  Transportation Needs:   . Lack of Transportation (Medical): Not on file  . Lack of Transportation (Non-Medical): Not on file  Physical Activity:   . Days of Exercise per Week: Not on file  . Minutes of Exercise per Session: Not on file  Stress:   . Feeling of Stress : Not on file  Social Connections:   . Frequency of Communication with Friends and Family: Not on file  . Frequency of Social Gatherings with Friends and  Family: Not on file  . Attends Religious Services: Not on file  . Active Member of Clubs or Organizations: Not on file  . Attends Banker Meetings: Not on file  . Marital Status: Not on file       Assessment and Plan:  Allergic rhinitis: Acute Just received his Covid test results and he is negative He typically gets this every year with changes in the weather Symptoms are mild and started 2 days ago Start Zyrtec, Mucinex If symptoms do not improve or worsen he will call and let us know   Follow Up Instructions:    I discussed the assessment and treatment plan with the patient. The patient was provided an opportunity to ask questions and all were answered. The patient agreed  with the plan and demonstrated an understanding of the instructions.   The patient was advised to call back or seek an in-person evaluation if the symptoms worsen or if the condition fails to improve as anticipated.    Pincus Sanes, MD

## 2019-12-06 ENCOUNTER — Encounter: Payer: Self-pay | Admitting: Internal Medicine

## 2019-12-06 ENCOUNTER — Telehealth (INDEPENDENT_AMBULATORY_CARE_PROVIDER_SITE_OTHER): Payer: BC Managed Care – PPO | Admitting: Internal Medicine

## 2019-12-06 DIAGNOSIS — Z20822 Contact with and (suspected) exposure to covid-19: Secondary | ICD-10-CM | POA: Diagnosis not present

## 2019-12-06 DIAGNOSIS — J309 Allergic rhinitis, unspecified: Secondary | ICD-10-CM | POA: Diagnosis not present

## 2019-12-06 DIAGNOSIS — J069 Acute upper respiratory infection, unspecified: Secondary | ICD-10-CM | POA: Diagnosis not present

## 2020-02-24 ENCOUNTER — Telehealth (INDEPENDENT_AMBULATORY_CARE_PROVIDER_SITE_OTHER): Payer: BC Managed Care – PPO | Admitting: Internal Medicine

## 2020-02-24 ENCOUNTER — Encounter: Payer: Self-pay | Admitting: Internal Medicine

## 2020-02-24 ENCOUNTER — Other Ambulatory Visit: Payer: Self-pay

## 2020-02-24 DIAGNOSIS — Z20822 Contact with and (suspected) exposure to covid-19: Secondary | ICD-10-CM | POA: Diagnosis not present

## 2020-02-24 NOTE — Progress Notes (Signed)
Virtual Visit via Audio Note  I connected with Joseph [name] on 02/24/20 at  3:00 PM EST by an audio-only enabled telemedicine application and verified that I am speaking with the correct person using two identifiers.  The patient and the provider were at separate locations throughout the entire encounter. Patient location: home, Provider location: work   I discussed the limitations of evaluation and management by telemedicine and the availability of in person appointments. The patient expressed understanding and agreed to proceed. The patient and the provider were the only parties present for the visit unless noted in HPI below.  History of Present Illness: The patient is a 40 y.o. man with visit for sore throat and congestion and cough. Started 2 days ago. Vaccinated for covid-19 times 2. Has been around sick contact Sunday. Denies fevers or chills or body aches. He feels that this is due to weather changes but needs test or doctor evaluation to return to work since he has symptoms. Overall it is stable. Has tried nothing  Observations/Objective: A and O times 3, voice strong, no coughing or dyspnea during visit  Assessment and Plan: See problem oriented charting  Follow Up Instructions: covid-19 test today, work note done  Visit time 12 minutes in face to face communication with patient and coordination of care.  I discussed the assessment and treatment plan with the patient. The patient was provided an opportunity to ask questions and all were answered. The patient agreed with the plan and demonstrated an understanding of the instructions.   The patient was advised to call back or seek an in-person evaluation if the symptoms worsen or if the condition fails to improve as anticipated.  Myrlene Broker, MD

## 2020-02-24 NOTE — Progress Notes (Signed)
Pt will come at 4:14 to get a covid test.  Arrival instructions given & pt verb understanding.

## 2020-02-24 NOTE — Assessment & Plan Note (Signed)
Covid-19 test ordered for today, can return to work when negative test or 02/28/20 if positive test and feeling improved on that date.

## 2020-02-28 ENCOUNTER — Telehealth: Payer: Self-pay | Admitting: Internal Medicine

## 2020-02-28 LAB — NOVEL CORONAVIRUS, NAA: SARS-CoV-2, NAA: DETECTED — AB

## 2020-02-28 NOTE — Telephone Encounter (Signed)
Pt had a video visit with Dr. Okey Dupre. Results are not back yet.

## 2020-02-28 NOTE — Telephone Encounter (Signed)
[facility] states they will fax results now; awaiting fax.

## 2020-02-28 NOTE — Telephone Encounter (Signed)
° ° °  Patient calling for COVID test results  Please call

## 2020-02-28 NOTE — Telephone Encounter (Signed)
Patient wondering if he can get a doctors notes excusing him out of work until his covid results get back, the one he has only excuses him out for the rest of the day and his work will expect him to be back regardless of the covid test not resulting

## 2020-02-29 NOTE — Telephone Encounter (Signed)
See labs.  Pt informed of below.   Please call positive result. If symptom free can return to work with mask as of 02/28/19, if not needs to continue with quarantine.   He states he is feeling much better today than the past few days. He plans to return on 03/01/20 and is requesting a letter allowing him to return to work on 03/01/20 at 4:00 pm. He would like to pick the letter up in the morning.

## 2020-02-29 NOTE — Telephone Encounter (Signed)
Info in result note

## 2020-02-29 NOTE — Telephone Encounter (Signed)
Patient called, made them aware that the test is positive. He is still having congestion but he is overall feeling better. Would like someone to call him in terms of how long he should quarantine.

## 2020-03-01 NOTE — Telephone Encounter (Signed)
Letter printed,signed and upfront for p/u. Pt informed.

## 2020-03-01 NOTE — Telephone Encounter (Signed)
Fine for letter

## 2020-04-01 ENCOUNTER — Telehealth: Payer: Self-pay | Admitting: Internal Medicine

## 2020-04-01 DIAGNOSIS — E139 Other specified diabetes mellitus without complications: Secondary | ICD-10-CM

## 2020-05-09 ENCOUNTER — Other Ambulatory Visit: Payer: Self-pay | Admitting: Internal Medicine

## 2020-05-09 DIAGNOSIS — E139 Other specified diabetes mellitus without complications: Secondary | ICD-10-CM

## 2020-05-09 MED ORDER — INSULIN DETEMIR 100 UNIT/ML FLEXPEN: 100.0000 [IU] | PEN_INJECTOR | SUBCUTANEOUS | 0 refills | Status: DC

## 2020-05-09 NOTE — Telephone Encounter (Signed)
[facility] [phone] - [place], [place] - [address] AT [facility] [address] [place] Phone:  [phone]  Fax:  [phone]     Patient requesting a short supply be sent in until his appointment on 04.12.22

## 2020-05-10 ENCOUNTER — Other Ambulatory Visit: Payer: Self-pay | Admitting: Internal Medicine

## 2020-05-10 ENCOUNTER — Telehealth: Payer: Self-pay | Admitting: Internal Medicine

## 2020-05-10 DIAGNOSIS — I10 Essential (primary) hypertension: Secondary | ICD-10-CM

## 2020-05-10 DIAGNOSIS — E139 Other specified diabetes mellitus without complications: Secondary | ICD-10-CM

## 2020-05-10 MED ORDER — RAMIPRIL 2.5 MG PO CAPS: 2.5000 mg | ORAL_CAPSULE | Freq: Every day | ORAL | 0 refills | Status: DC

## 2020-05-10 MED ORDER — FREESTYLE LIBRE 2 READER DEVI: 1.0000 | Freq: Every day | 5 refills | Status: DC

## 2020-05-10 MED ORDER — FREESTYLE LIBRE 2 SENSOR MISC: 1.0000 | Freq: Every day | 5 refills | Status: DC

## 2020-05-10 NOTE — Telephone Encounter (Signed)
Patient is requesting a short supply of ramipril (ALTACE) 2.5 MG capsule until he can see PCP on 05/29/20. It can be sent to [facility] [phone] - [place], [place] - [address] AT [facility] OF [address]. Please advise

## 2020-05-11 ENCOUNTER — Telehealth: Payer: Self-pay | Admitting: Internal Medicine

## 2020-05-11 NOTE — Telephone Encounter (Signed)
Error

## 2020-05-16 ENCOUNTER — Telehealth: Payer: Self-pay | Admitting: Internal Medicine

## 2020-05-16 DIAGNOSIS — Z0279 Encounter for issue of other medical certificate: Secondary | ICD-10-CM

## 2020-05-16 NOTE — Telephone Encounter (Signed)
Forms have been signed, Faxed to [employer] @[phone], Copy sent to scan &Charged for.   Patient informed, Original mailed to patient for his records.

## 2020-05-16 NOTE — Telephone Encounter (Signed)
I received accommodation forms via fax.  LOV: 08/08/19 NOV: 05/29/20  Forms have been completed &Placed in providers box to review and sign.

## 2020-05-29 ENCOUNTER — Other Ambulatory Visit: Payer: Self-pay

## 2020-05-29 ENCOUNTER — Ambulatory Visit (INDEPENDENT_AMBULATORY_CARE_PROVIDER_SITE_OTHER): Payer: BC Managed Care – PPO | Admitting: Internal Medicine

## 2020-05-29 ENCOUNTER — Encounter: Payer: Self-pay | Admitting: Internal Medicine

## 2020-05-29 VITALS — BP 148/98 | HR 93 | Temp 98.2°F | Resp 16 | Ht 72.0 in | Wt 249.6 lb

## 2020-05-29 DIAGNOSIS — I10 Essential (primary) hypertension: Secondary | ICD-10-CM

## 2020-05-29 DIAGNOSIS — E139 Other specified diabetes mellitus without complications: Secondary | ICD-10-CM | POA: Diagnosis not present

## 2020-05-29 DIAGNOSIS — Z125 Encounter for screening for malignant neoplasm of prostate: Secondary | ICD-10-CM | POA: Diagnosis not present

## 2020-05-29 DIAGNOSIS — E559 Vitamin D deficiency, unspecified: Secondary | ICD-10-CM

## 2020-05-29 DIAGNOSIS — Z23 Encounter for immunization: Secondary | ICD-10-CM | POA: Diagnosis not present

## 2020-05-29 DIAGNOSIS — Z0000 Encounter for general adult medical examination without abnormal findings: Secondary | ICD-10-CM | POA: Diagnosis not present

## 2020-05-29 DIAGNOSIS — N529 Male erectile dysfunction, unspecified: Secondary | ICD-10-CM

## 2020-05-29 DIAGNOSIS — E785 Hyperlipidemia, unspecified: Secondary | ICD-10-CM | POA: Diagnosis not present

## 2020-05-29 LAB — LIPID PANEL
Cholesterol: 162 mg/dL (ref 0–200)
HDL: 42 mg/dL (ref >39.00)
LDL Cholesterol: 108 mg/dL — ABNORMAL HIGH (ref 0–99)
NonHDL: 120.14
Total CHOL/HDL Ratio: 4
Triglycerides: 59 mg/dL (ref 0.0–149.0)
VLDL: 11.8 mg/dL (ref 0.0–40.0)

## 2020-05-29 LAB — URINALYSIS, ROUTINE W REFLEX MICROSCOPIC
Bilirubin Urine: NEGATIVE
Hgb urine dipstick: NEGATIVE
Leukocytes,Ua: NEGATIVE
Nitrite: NEGATIVE
Specific Gravity, Urine: >=1.03 — AB (ref 1.000–1.030)
Total Protein, Urine: NEGATIVE
Urine Glucose: 100 — AB
Urobilinogen, UA: 0.2 (ref 0.0–1.0)
pH: 5.5 (ref 5.0–8.0)

## 2020-05-29 LAB — BASIC METABOLIC PANEL
BUN: 12 mg/dL (ref 6–23)
CO2: 25 mEq/L (ref 19–32)
Calcium: 9.3 mg/dL (ref 8.4–10.5)
Chloride: 104 mEq/L (ref 96–112)
Creatinine, Ser: 0.75 mg/dL (ref 0.40–1.50)
GFR: 112.33 mL/min (ref >60.00)
Glucose, Bld: 131 mg/dL — ABNORMAL HIGH (ref 70–99)
Potassium: 3.7 mEq/L (ref 3.5–5.1)
Sodium: 139 mEq/L (ref 135–145)

## 2020-05-29 LAB — CBC WITH DIFFERENTIAL/PLATELET
Basophils Absolute: 0 10*3/uL (ref 0.0–0.1)
Basophils Relative: 0.5 % (ref 0.0–3.0)
Eosinophils Absolute: 0 10*3/uL (ref 0.0–0.7)
Eosinophils Relative: 0.2 % (ref 0.0–5.0)
HCT: 42.7 % (ref 39.0–52.0)
Hemoglobin: 14.3 g/dL (ref 13.0–17.0)
Lymphocytes Relative: 37.7 % (ref 12.0–46.0)
Lymphs Abs: 2.5 10*3/uL (ref 0.7–4.0)
MCHC: 33.4 g/dL (ref 30.0–36.0)
MCV: 89.1 fl (ref 78.0–100.0)
Monocytes Absolute: 0.6 10*3/uL (ref 0.1–1.0)
Monocytes Relative: 9.6 % (ref 3.0–12.0)
Neutro Abs: 3.4 10*3/uL (ref 1.4–7.7)
Neutrophils Relative %: 52 % (ref 43.0–77.0)
Platelets: 287 10*3/uL (ref 150.0–400.0)
RBC: 4.79 Mil/uL (ref 4.22–5.81)
RDW: 12.7 % (ref 11.5–15.5)
WBC: 6.6 10*3/uL (ref 4.0–10.5)

## 2020-05-29 LAB — MICROALBUMIN / CREATININE URINE RATIO
Creatinine,U: 226.6 mg/dL
Microalb Creat Ratio: 2.7 mg/g (ref 0.0–30.0)
Microalb, Ur: 6 mg/dL — ABNORMAL HIGH (ref 0.0–1.9)

## 2020-05-29 LAB — PSA: PSA: 0.75 ng/mL (ref 0.10–4.00)

## 2020-05-29 LAB — TSH: TSH: 2.11 u[IU]/mL (ref 0.35–4.50)

## 2020-05-29 LAB — VITAMIN D 25 HYDROXY (VIT D DEFICIENCY, FRACTURES): VITD: 9.07 ng/mL — ABNORMAL LOW (ref 30.00–100.00)

## 2020-05-29 LAB — HEMOGLOBIN A1C: Hgb A1c MFr Bld: 11.7 % — ABNORMAL HIGH (ref 4.6–6.5)

## 2020-05-29 MED ORDER — GVOKE HYPOPEN 2-PACK 1 MG/0.2ML ~~LOC~~ SOAJ
1.0000 | Freq: Every day | SUBCUTANEOUS | 5 refills | Status: DC | PRN
Start: 2020-05-29 — End: 2022-07-03

## 2020-05-29 MED ORDER — METFORMIN HCL ER 750 MG PO TB24: 1500.0000 mg | ORAL_TABLET | Freq: Every day | ORAL | 1 refills | Status: DC

## 2020-05-29 MED ORDER — CHOLECALCIFEROL 1.25 MG (50000 UT) PO CAPS
50000.0000 [IU] | ORAL_CAPSULE | ORAL | 1 refills | Status: DC
Start: 2020-05-29 — End: 2022-07-03

## 2020-05-29 MED ORDER — INSULIN LISPRO 200 UNIT/ML ~~LOC~~ SOPN
10.0000 [IU] | PEN_INJECTOR | Freq: Three times a day (TID) | SUBCUTANEOUS | 1 refills | Status: DC
Start: 2020-05-29 — End: 2021-04-30

## 2020-05-29 MED ORDER — INDAPAMIDE 1.25 MG PO TABS: 1.2500 mg | ORAL_TABLET | Freq: Every day | ORAL | 0 refills | Status: DC

## 2020-05-29 MED ORDER — RYBELSUS 3 MG PO TABS: 3.0000 mg | ORAL_TABLET | Freq: Every day | ORAL | 0 refills | Status: AC

## 2020-05-29 MED ORDER — ROSUVASTATIN CALCIUM 20 MG PO TABS: 20.0000 mg | ORAL_TABLET | Freq: Every day | ORAL | 1 refills | Status: DC

## 2020-05-29 MED ORDER — RAMIPRIL 10 MG PO CAPS: 10.0000 mg | ORAL_CAPSULE | Freq: Every day | ORAL | 1 refills | Status: DC

## 2020-05-29 MED ORDER — SILDENAFIL CITRATE 100 MG PO TABS: 100.0000 mg | ORAL_TABLET | Freq: Every day | ORAL | 11 refills | Status: DC | PRN

## 2020-05-29 NOTE — Progress Notes (Signed)
Subjective:  Patient ID: Joseph [name], male    DOB: [birth year]  Age: 40 y.o. MRN: [id number]  CC: Annual Exam, Hypertension, Hyperlipidemia, and Diabetes  This visit occurred during the SARS-CoV-2 public health emergency.  Safety protocols were in place, including screening questions prior to the visit, additional usage of staff PPE, and extensive cleaning of exam room while observing appropriate contact time as indicated for disinfecting solutions.    HPI Joseph [name] presents for a CPX.  He complains that his blood sugar is consistently above 240 and sometimes above 300.  He complains of polys.  He has been using a basal insulin but no bolus insulin.  He is also concerned that his blood pressure is not adequately well controlled.  He denies headache, blurred vision, or edema.  He is active and denies any recent episodes of chest pain, shortness of breath, diaphoresis, dizziness, or lightheadedness.   Outpatient Medications Prior to Visit  Medication Sig Dispense Refill  . Continuous Blood Gluc Receiver (FREESTYLE LIBRE 2 READER) DEVI 1 Act by Does not apply route daily. 2 each 5  . Continuous Blood Gluc Sensor (FREESTYLE LIBRE 2 SENSOR) MISC 1 Act by Does not apply route daily. 2 each 5  . FREESTYLE LITE test strip USE TO TEST DAILY (INS PAYS FOR 30 DAYS) 100 strip 3  . glucose blood (CONTOUR NEXT TEST) test strip 1 each by Other route in the morning and at bedtime. And lancets 2/day 180 each 3  . insulin detemir (LEVEMIR) 100 UNIT/ML FlexPen Inject 100 Units into the skin every morning. 3 mL 0  . Insulin Pen Needle (PEN NEEDLES) 32G X 4 MM MISC 1 each by Does not apply route in the morning, at noon, in the evening, and at bedtime. Use with insulin 400 each 1  . Lancets (FREESTYLE) lancets 1 each by Other route daily. E11.9 100 each 0  . Blood Glucose Monitoring Suppl (FREESTYLE LITE) DEVI 1 each by Does not apply route daily. E11.9 1 each 0  . ramipril (ALTACE) 2.5 MG capsule Take 1  capsule (2.5 mg total) by mouth daily. 30 capsule 0  . rosuvastatin (CRESTOR) 20 MG tablet Take 1 tablet (20 mg total) by mouth daily. 90 tablet 1  . sildenafil (VIAGRA) 100 MG tablet Take 1 tablet (100 mg total) by mouth daily as needed for erectile dysfunction. DO NOT BILL INSURANCE. WILL PURCHASE OUT OF POCKET (PT TO CALL BACK WITH NAME OF PHARMACY) 10 tablet 11   No facility-administered medications prior to visit.    ROS Review of Systems  Constitutional: Negative for appetite change, chills, diaphoresis, fatigue and fever.  HENT: Negative.   Eyes: Negative for visual disturbance.  Respiratory: Negative for cough, chest tightness, shortness of breath and wheezing.   Cardiovascular: Negative for chest pain, palpitations and leg swelling.  Gastrointestinal: Negative for abdominal pain, constipation, diarrhea, nausea and vomiting.  Endocrine: Positive for polydipsia and polyuria. Negative for polyphagia.  Genitourinary: Positive for frequency. Negative for difficulty urinating, dysuria, hematuria, penile swelling, scrotal swelling and testicular pain.       ++ED  Musculoskeletal: Negative.  Negative for arthralgias, back pain, myalgias and neck pain.  Skin: Negative.   Neurological: Negative.  Negative for dizziness, weakness, light-headedness, numbness and headaches.  Hematological: Negative for adenopathy. Does not bruise/bleed easily.  Psychiatric/Behavioral: Negative.     Objective:  BP (!) 148/98 (BP Location: Right Arm, Patient Position: Sitting, Cuff Size: Large)   Pulse 93   Temp 98.2 F (36.8  C) (Oral)   Resp 16   Ht 6' (1.829 m)   Wt 249 lb 9.6 oz (113.2 kg)   SpO2 97%   BMI 33.85 kg/m   BP Readings from Last 3 Encounters:  05/29/20 (!) 148/98  08/16/19 132/82  08/08/19 138/76    Wt Readings from Last 3 Encounters:  05/29/20 249 lb 9.6 oz (113.2 kg)  08/16/19 247 lb 9.6 oz (112.3 kg)  08/08/19 254 lb 4 oz (115.3 kg)    Physical Exam Vitals reviewed.   HENT:     Nose: Nose normal.     Mouth/Throat:     Mouth: Mucous membranes are moist.  Eyes:     General: No scleral icterus.    Conjunctiva/sclera: Conjunctivae normal.  Cardiovascular:     Rate and Rhythm: Normal rate and regular rhythm.     Heart sounds: No murmur heard.     Comments: EKG -  NSR, 86 bpm Early repol, conduction delay in III with inverted T wave No Q waves No changes compared to the prior EKG in 2018 Pulmonary:     Effort: Pulmonary effort is normal.     Breath sounds: No stridor. No wheezing, rhonchi or rales.  Abdominal:     General: Abdomen is protuberant. Bowel sounds are normal. There is no distension.     Palpations: Abdomen is soft. There is no hepatomegaly, splenomegaly or mass.     Tenderness: There is no abdominal tenderness.  Musculoskeletal:        General: Normal range of motion.     Cervical back: Neck supple.     Right lower leg: No edema.     Left lower leg: No edema.  Lymphadenopathy:     Cervical: No cervical adenopathy.  Skin:    General: Skin is warm and dry.     Coloration: Skin is not pale.     Findings: No rash.  Neurological:     General: No focal deficit present.     Mental Status: He is alert and oriented to person, place, and time. Mental status is at baseline.  Psychiatric:        Mood and Affect: Mood normal.        Behavior: Behavior normal.     Lab Results  Component Value Date   WBC 6.6 05/29/2020   HGB 14.3 05/29/2020   HCT 42.7 05/29/2020   PLT 287.0 05/29/2020   GLUCOSE 131 (H) 05/29/2020   CHOL 162 05/29/2020   TRIG 59.0 05/29/2020   HDL 42.00 05/29/2020   LDLCALC 108 (H) 05/29/2020   ALT 27 01/24/2019   AST 16 01/24/2019   NA 139 05/29/2020   K 3.7 05/29/2020   CL 104 05/29/2020   CREATININE 0.75 05/29/2020   BUN 12 05/29/2020   CO2 25 05/29/2020   TSH 2.11 05/29/2020   PSA 0.75 05/29/2020   INR 1.00 05/02/2016   HGBA1C 11.7 (H) 05/29/2020   MICROALBUR 6.0 (H) 05/29/2020    No results  found.  Assessment & Plan:   Joseph [name] was seen today for annual exam, hypertension, hyperlipidemia and diabetes.  Diagnoses and all orders for this visit:  Essential hypertension- His blood pressure is not adequately well controlled.  I recommended that he add indapamide and increase the dose of the ACE inhibitor. -     CBC with Differential/Platelet; Future -     Basic metabolic panel; Future -     TSH; Future -     Urinalysis, Routine w reflex microscopic;  Future -     VITAMIN D 25 Hydroxy (Vit-D Deficiency, Fractures); Future -     indapamide (LOZOL) 1.25 MG tablet; Take 1 tablet (1.25 mg total) by mouth daily. -     VITAMIN D 25 Hydroxy (Vit-D Deficiency, Fractures) -     Urinalysis, Routine w reflex microscopic -     TSH -     Basic metabolic panel -     CBC with Differential/Platelet -     ramipril (ALTACE) 10 MG capsule; Take 1 capsule (10 mg total) by mouth daily. -     EKG 12-Lead  Diabetes 1.5, managed as type 1 (HCC)- His A1c is too high at 11.7%.  Will add mealtime bolus insulin to the basal insulin.  I have also recommended that he start taking Metformin and a GLP-1 agonist. -     Basic metabolic panel; Future -     Microalbumin / creatinine urine ratio; Future -     Hemoglobin A1c; Future -     Ambulatory referral to Ophthalmology -     HM Diabetes Foot Exam -     Hemoglobin A1c -     Microalbumin / creatinine urine ratio -     Basic metabolic panel -     insulin lispro (HUMALOG) 200 UNIT/ML KwikPen; Inject 10 Units into the skin 3 (three) times daily with meals. -     Ambulatory referral to Endocrinology -     Amb Referral to Nutrition and Diabetic Education -     Glucagon (GVOKE HYPOPEN 2-PACK) 1 MG/0.2ML SOAJ; Inject 1 Act into the skin daily as needed. -     Semaglutide (RYBELSUS) 3 MG TABS; Take 3 mg by mouth daily. -     metFORMIN (GLUCOPHAGE XR) 750 MG 24 hr tablet; Take 2 tablets (1,500 mg total) by mouth daily with breakfast. -     ramipril (ALTACE) 10 MG  capsule; Take 1 capsule (10 mg total) by mouth daily.  Hyperlipidemia with target LDL less than 100- I recommended that he take a statin for cardiovascular risk reduction. -     TSH; Future -     rosuvastatin (CRESTOR) 20 MG tablet; Take 1 tablet (20 mg total) by mouth daily. -     TSH  Routine general medical examination at a health care facility- Exam completed, labs reviewed, vaccines reviewed and updated, cancer screenings addressed, patient education was given. -     Lipid panel; Future -     PSA; Future -     Hepatitis C antibody; Future -     Hepatitis C antibody -     PSA -     Lipid panel  Need for pneumococcal vaccination -     Pneumococcal conjugate vaccine 20-valent (Prevnar 20)  Erectile dysfunction, unspecified erectile dysfunction type -     sildenafil (VIAGRA) 100 MG tablet; Take 1 tablet (100 mg total) by mouth daily as needed for erectile dysfunction.  Vitamin D deficiency disease -     Cholecalciferol 1.25 MG (50000 UT) capsule; Take 1 capsule (50,000 Units total) by mouth once a week.   I have discontinued Joseph [name]'s ramipril. I have also changed his sildenafil. Additionally, I am having him start on indapamide, Cholecalciferol, Gvoke HypoPen 2-Pack, Rybelsus, metFORMIN, and ramipril. Lastly, I am having him maintain his freestyle, Contour Next Test, FREESTYLE LITE, Pen Needles, insulin detemir, FreeStyle Libre 2 Sensor, [employer] 2 Reader, rosuvastatin, and insulin lispro.  Meds ordered this encounter  Medications  .  rosuvastatin (CRESTOR) 20 MG tablet    Sig: Take 1 tablet (20 mg total) by mouth daily.    Dispense:  90 tablet    Refill:  1  . indapamide (LOZOL) 1.25 MG tablet    Sig: Take 1 tablet (1.25 mg total) by mouth daily.    Dispense:  90 tablet    Refill:  0  . sildenafil (VIAGRA) 100 MG tablet    Sig: Take 1 tablet (100 mg total) by mouth daily as needed for erectile dysfunction.    Dispense:  10 tablet    Refill:  11  .  Cholecalciferol 1.25 MG (50000 UT) capsule    Sig: Take 1 capsule (50,000 Units total) by mouth once a week.    Dispense:  12 capsule    Refill:  1  . insulin lispro (HUMALOG) 200 UNIT/ML KwikPen    Sig: Inject 10 Units into the skin 3 (three) times daily with meals.    Dispense:  9 mL    Refill:  1  . Glucagon (GVOKE HYPOPEN 2-PACK) 1 MG/0.2ML SOAJ    Sig: Inject 1 Act into the skin daily as needed.    Dispense:  2 mL    Refill:  5  . Semaglutide (RYBELSUS) 3 MG TABS    Sig: Take 3 mg by mouth daily.    Dispense:  30 tablet    Refill:  0  . metFORMIN (GLUCOPHAGE XR) 750 MG 24 hr tablet    Sig: Take 2 tablets (1,500 mg total) by mouth daily with breakfast.    Dispense:  180 tablet    Refill:  1  . ramipril (ALTACE) 10 MG capsule    Sig: Take 1 capsule (10 mg total) by mouth daily.    Dispense:  90 capsule    Refill:  1     Follow-up: Return in about 3 months (around 08/28/2020).  Sanda Linger, MD

## 2020-05-29 NOTE — Patient Instructions (Signed)

## 2020-05-30 ENCOUNTER — Encounter: Payer: Self-pay | Admitting: Internal Medicine

## 2020-05-30 LAB — HEPATITIS C ANTIBODY
Hepatitis C Ab: NONREACTIVE
SIGNAL TO CUT-OFF: 0.14 (ref <1.00)

## 2020-05-31 ENCOUNTER — Encounter: Payer: Self-pay | Admitting: Internal Medicine

## 2020-07-13 ENCOUNTER — Telehealth: Payer: Self-pay | Admitting: Internal Medicine

## 2020-07-13 NOTE — Telephone Encounter (Signed)
I have spoke to pt and I have suggested that he speak with his insurance company to ask what similar medications/insulins they do cover. I explained that would help Dr. Yetta Barre with sending in an alternative.

## 2020-07-13 NOTE — Telephone Encounter (Signed)
Patient states the insulin lispro (HUMALOG) 200 UNIT/ML KwikPen is not covered by his insurance. Requesting an alternative sent to preferred pharmacy listed below.  Please advise   Preferred Pharmacy: [facility] [phone] - [place], [place] - [address] AT [facility] [address] Phone:  [phone]  Fax:  [phone]

## 2020-07-16 LAB — HM DIABETES EYE EXAM

## 2020-07-23 ENCOUNTER — Ambulatory Visit: Payer: BC Managed Care – PPO | Admitting: Dietician

## 2020-11-16 ENCOUNTER — Other Ambulatory Visit: Payer: Self-pay | Admitting: Endocrinology

## 2020-11-16 DIAGNOSIS — N529 Male erectile dysfunction, unspecified: Secondary | ICD-10-CM

## 2021-04-25 ENCOUNTER — Telehealth: Payer: Self-pay | Admitting: Internal Medicine

## 2021-04-25 NOTE — Telephone Encounter (Signed)
1.Medication Requested: insulin detemir (LEVEMIR) 100 UNIT/ML FlexPen ? ?2. Pharmacy (Name, Street, Pala): [facility] [phone] - [place], [place] - [address] AT [facility] [address]  ?Phone:  [phone] ?Fax:  [phone] ? ? ?3. On Med List: yes ? ?4. Last Visit with PCP: 04.12.22 ? ?5. Next visit date with PCP: n/a ? ? ?Agent: Please be advised that RX refills may take up to 3 business days. We ask that you follow-up with your pharmacy.  ?

## 2021-04-26 NOTE — Telephone Encounter (Signed)
Pt scheduled for 3/13 @ 10.40am ?

## 2021-04-27 DIAGNOSIS — M25512 Pain in left shoulder: Secondary | ICD-10-CM | POA: Diagnosis not present

## 2021-04-29 ENCOUNTER — Other Ambulatory Visit: Payer: Self-pay

## 2021-04-29 ENCOUNTER — Ambulatory Visit (INDEPENDENT_AMBULATORY_CARE_PROVIDER_SITE_OTHER): Payer: BC Managed Care – PPO | Admitting: Internal Medicine

## 2021-04-29 ENCOUNTER — Encounter: Payer: Self-pay | Admitting: Internal Medicine

## 2021-04-29 VITALS — BP 126/78 | HR 112 | Temp 98.1°F | Ht 72.0 in | Wt 239.0 lb

## 2021-04-29 DIAGNOSIS — I10 Essential (primary) hypertension: Secondary | ICD-10-CM

## 2021-04-29 DIAGNOSIS — R0781 Pleurodynia: Secondary | ICD-10-CM

## 2021-04-29 DIAGNOSIS — E139 Other specified diabetes mellitus without complications: Secondary | ICD-10-CM | POA: Diagnosis not present

## 2021-04-29 DIAGNOSIS — R000 Tachycardia, unspecified: Secondary | ICD-10-CM | POA: Diagnosis not present

## 2021-04-29 DIAGNOSIS — E785 Hyperlipidemia, unspecified: Secondary | ICD-10-CM

## 2021-04-29 DIAGNOSIS — R9431 Abnormal electrocardiogram [ECG] [EKG]: Secondary | ICD-10-CM

## 2021-04-29 DIAGNOSIS — M25512 Pain in left shoulder: Secondary | ICD-10-CM

## 2021-04-29 LAB — CBC WITH DIFFERENTIAL/PLATELET
Basophils Absolute: 0 10*3/uL (ref 0.0–0.1)
Basophils Relative: 0.5 % (ref 0.0–3.0)
Eosinophils Absolute: 0 10*3/uL (ref 0.0–0.7)
Eosinophils Relative: 0.1 % (ref 0.0–5.0)
HCT: 43.7 % (ref 39.0–52.0)
Hemoglobin: 14.7 g/dL (ref 13.0–17.0)
Lymphocytes Relative: 33.5 % (ref 12.0–46.0)
Lymphs Abs: 2 10*3/uL (ref 0.7–4.0)
MCHC: 33.6 g/dL (ref 30.0–36.0)
MCV: 91.3 fl (ref 78.0–100.0)
Monocytes Absolute: 0.5 10*3/uL (ref 0.1–1.0)
Monocytes Relative: 8.3 % (ref 3.0–12.0)
Neutro Abs: 3.5 10*3/uL (ref 1.4–7.7)
Neutrophils Relative %: 57.6 % (ref 43.0–77.0)
Platelets: 239 10*3/uL (ref 150.0–400.0)
RBC: 4.79 Mil/uL (ref 4.22–5.81)
RDW: 12.7 % (ref 11.5–15.5)
WBC: 6.1 10*3/uL (ref 4.0–10.5)

## 2021-04-29 LAB — URINALYSIS, ROUTINE W REFLEX MICROSCOPIC
Bilirubin Urine: NEGATIVE
Hgb urine dipstick: NEGATIVE
Ketones, ur: 15 — AB
Leukocytes,Ua: NEGATIVE
Nitrite: NEGATIVE
RBC / HPF: NONE SEEN (ref 0–?)
Specific Gravity, Urine: 1.02 (ref 1.000–1.030)
Total Protein, Urine: 30 — AB
Urine Glucose: >=1000 — AB
Urobilinogen, UA: 1 (ref 0.0–1.0)
pH: 6.5 (ref 5.0–8.0)

## 2021-04-29 LAB — BASIC METABOLIC PANEL
BUN: 14 mg/dL (ref 6–23)
CO2: 26 mEq/L (ref 19–32)
Calcium: 9.4 mg/dL (ref 8.4–10.5)
Chloride: 102 mEq/L (ref 96–112)
Creatinine, Ser: 0.91 mg/dL (ref 0.40–1.50)
GFR: 104.24 mL/min (ref >60.00)
Glucose, Bld: 347 mg/dL — ABNORMAL HIGH (ref 70–99)
Potassium: 3.9 mEq/L (ref 3.5–5.1)
Sodium: 137 mEq/L (ref 135–145)

## 2021-04-29 LAB — HEPATIC FUNCTION PANEL
ALT: 27 U/L (ref 0–53)
AST: 16 U/L (ref 0–37)
Albumin: 4.4 g/dL (ref 3.5–5.2)
Alkaline Phosphatase: 66 U/L (ref 39–117)
Bilirubin, Direct: 0.1 mg/dL (ref 0.0–0.3)
Total Bilirubin: 0.9 mg/dL (ref 0.2–1.2)
Total Protein: 7.7 g/dL (ref 6.0–8.3)

## 2021-04-29 LAB — HEMOGLOBIN A1C: Hgb A1c MFr Bld: 11.1 % — ABNORMAL HIGH (ref 4.6–6.5)

## 2021-04-29 LAB — MICROALBUMIN / CREATININE URINE RATIO
Creatinine,U: 133.5 mg/dL
Microalb Creat Ratio: 12.1 mg/g (ref 0.0–30.0)
Microalb, Ur: 16.2 mg/dL — ABNORMAL HIGH (ref 0.0–1.9)

## 2021-04-29 LAB — TROPONIN I (HIGH SENSITIVITY): High Sens Troponin I: 4 ng/L (ref 2–17)

## 2021-04-29 LAB — D-DIMER, QUANTITATIVE: D-Dimer, Quant: <0.19 mcg/mL FEU (ref <0.50)

## 2021-04-29 NOTE — Progress Notes (Signed)
Subjective:  Patient ID: Joseph [name], male    DOB: [birth year]  Age: 40 y.o. MRN: [id number]  CC: Diabetes and Chest Pain  This visit occurred during the SARS-CoV-2 public health emergency.  Safety protocols were in place, including screening questions prior to the visit, additional usage of staff PPE, and extensive cleaning of exam room while observing appropriate contact time as indicated for disinfecting solutions.    HPI Joseph [name] presents for f/up -   He complains of a 5-day history of left shoulder pain and left upper chest pain.  He describes the chest pain is pleuritic.  He was recently seen at an [facility] and was prescribed 2 medicines for the shoulder pain without much relief.  He denies shortness of breath, diaphoresis, cough, hemoptysis, fever, chills, wheezing, or shortness of breath.  Outpatient Medications Prior to Visit  Medication Sig Dispense Refill   Cholecalciferol 1.25 MG (50000 UT) capsule Take 1 capsule (50,000 Units total) by mouth once a week. 12 capsule 1   FREESTYLE LITE test strip USE TO TEST DAILY (INS PAYS FOR 30 DAYS) 100 strip 3   Glucagon (GVOKE HYPOPEN 2-PACK) 1 MG/0.2ML SOAJ Inject 1 Act into the skin daily as needed. 2 mL 5   glucose blood (CONTOUR NEXT TEST) test strip 1 each by Other route in the morning and at bedtime. And lancets 2/day 180 each 3   indapamide (LOZOL) 1.25 MG tablet Take 1 tablet (1.25 mg total) by mouth daily. 90 tablet 0   Lancets (FREESTYLE) lancets 1 each by Other route daily. E11.9 100 each 0   meloxicam (MOBIC) 15 MG tablet Take 15 mg by mouth daily.     methocarbamol (ROBAXIN) 500 MG tablet Take 1,000 mg by mouth 2 (two) times daily.     ramipril (ALTACE) 10 MG capsule Take 1 capsule (10 mg total) by mouth daily. 90 capsule 1   sildenafil (VIAGRA) 100 MG tablet Take 1 tablet (100 mg total) by mouth daily as needed for erectile dysfunction. 10 tablet 11   Continuous Blood Gluc Receiver (FREESTYLE LIBRE 2 READER) DEVI 1 Act by  Does not apply route daily. 2 each 5   Continuous Blood Gluc Sensor (FREESTYLE LIBRE 2 SENSOR) MISC 1 Act by Does not apply route daily. 2 each 5   insulin detemir (LEVEMIR) 100 UNIT/ML FlexPen Inject 100 Units into the skin every morning. 3 mL 0   insulin lispro (HUMALOG) 200 UNIT/ML KwikPen Inject 10 Units into the skin 3 (three) times daily with meals. 9 mL 1   Insulin Pen Needle (PEN NEEDLES) 32G X 4 MM MISC 1 each by Does not apply route in the morning, at noon, in the evening, and at bedtime. Use with insulin 400 each 1   metFORMIN (GLUCOPHAGE XR) 750 MG 24 hr tablet Take 2 tablets (1,500 mg total) by mouth daily with breakfast. 180 tablet 1   rosuvastatin (CRESTOR) 20 MG tablet Take 1 tablet (20 mg total) by mouth daily. 90 tablet 1   No facility-administered medications prior to visit.    ROS Review of Systems  Constitutional: Negative.  Negative for diaphoresis, fatigue, fever and unexpected weight change.  HENT: Negative.    Eyes: Negative.   Respiratory:  Negative for cough, chest tightness, shortness of breath and wheezing.   Cardiovascular:  Positive for chest pain. Negative for palpitations and leg swelling.  Gastrointestinal:  Negative for abdominal pain, constipation, diarrhea, nausea and vomiting.  Endocrine: Negative.  Negative for polydipsia, polyphagia and polyuria.  Genitourinary: Negative.  Negative for difficulty urinating.  Musculoskeletal:  Positive for arthralgias. Negative for joint swelling.  Skin: Negative.   Neurological: Negative.  Negative for dizziness, weakness and light-headedness.  Hematological:  Negative for adenopathy. Does not bruise/bleed easily.  Psychiatric/Behavioral: Negative.     Objective:  BP 126/78   Pulse (!) 112   Temp 98.1 F (36.7 C) (Oral)   Ht 6' (1.829 m)   Wt 239 lb (108.4 kg)   SpO2 96%   BMI 32.41 kg/m   BP Readings from Last 3 Encounters:  04/29/21 126/78  05/29/20 (!) 148/98  08/16/19 132/82    Wt Readings from  Last 3 Encounters:  04/29/21 239 lb (108.4 kg)  05/29/20 249 lb 9.6 oz (113.2 kg)  08/16/19 247 lb 9.6 oz (112.3 kg)    Physical Exam Vitals reviewed.  Constitutional:      General: He is not in acute distress.    Appearance: He is not ill-appearing, toxic-appearing or diaphoretic.  HENT:     Nose: Nose normal.     Mouth/Throat:     Mouth: Mucous membranes are moist.  Eyes:     General: No scleral icterus.    Conjunctiva/sclera: Conjunctivae normal.  Cardiovascular:     Rate and Rhythm: Regular rhythm. Tachycardia present.     Heart sounds: Normal heart sounds, S1 normal and S2 normal. No murmur heard.   No friction rub. No gallop.     Comments: EKG- NSR, 99 bpm TWI in inf leads is not new TWI in lateral leads is new No Q WAVES  Pulmonary:     Effort: Pulmonary effort is normal.     Breath sounds: No stridor. No wheezing, rhonchi or rales.  Chest:     Chest wall: No mass, swelling or tenderness.  Abdominal:     General: Abdomen is flat.     Palpations: There is no mass.     Tenderness: There is no abdominal tenderness. There is no guarding.     Hernia: No hernia is present.  Musculoskeletal:     Right shoulder: Normal.     Left shoulder: No swelling or bony tenderness. Decreased range of motion.     Cervical back: Neck supple.     Right lower leg: No edema.     Left lower leg: No edema.  Lymphadenopathy:     Cervical: No cervical adenopathy.  Skin:    General: Skin is warm and dry.     Findings: No rash.  Neurological:     General: No focal deficit present.     Mental Status: He is alert.    Lab Results  Component Value Date   WBC 6.1 04/29/2021   HGB 14.7 04/29/2021   HCT 43.7 04/29/2021   PLT 239.0 04/29/2021   GLUCOSE 347 (H) 04/29/2021   CHOL 162 05/29/2020   TRIG 59.0 05/29/2020   HDL 42.00 05/29/2020   LDLCALC 108 (H) 05/29/2020   ALT 27 04/29/2021   AST 16 04/29/2021   NA 137 04/29/2021   K 3.9 04/29/2021   CL 102 04/29/2021   CREATININE  0.91 04/29/2021   BUN 14 04/29/2021   CO2 26 04/29/2021   TSH 2.11 05/29/2020   PSA 0.75 05/29/2020   INR 1.00 05/02/2016   HGBA1C 11.1 (H) 04/29/2021   MICROALBUR 16.2 (H) 04/29/2021     Assessment & Plan:   Joseph [name] was seen today for diabetes and chest pain.  Diagnoses and all orders for this visit:  Diabetes 1.5,  managed as type 1 (HCC)- His A1c is 11.1%.  I have asked him to be more compliant with the basal/bolus insulin as well as metformin. -     Basic metabolic panel; Future -     Microalbumin / creatinine urine ratio; Future -     Urinalysis, Routine w reflex microscopic; Future -     Hemoglobin A1c; Future -     Hepatic function panel; Future -     Hepatic function panel -     Hemoglobin A1c -     Urinalysis, Routine w reflex microscopic -     Microalbumin / creatinine urine ratio -     Basic metabolic panel -     Continuous Blood Gluc Sensor (FREESTYLE LIBRE 2 SENSOR) MISC; 1 Act by Does not apply route daily. -     Continuous Blood Gluc Receiver (FREESTYLE LIBRE 2 READER) DEVI; 1 Act by Does not apply route daily. -     insulin lispro (HUMALOG) 200 UNIT/ML KwikPen; Inject 10 Units into the skin 3 (three) times daily with meals. -     metFORMIN (GLUCOPHAGE XR) 750 MG 24 hr tablet; Take 2 tablets (1,500 mg total) by mouth daily with breakfast. -     Insulin Pen Needle (PEN NEEDLES) 32G X 4 MM MISC; 1 each by Does not apply route in the morning, at noon, in the evening, and at bedtime. Use with insulin -     insulin detemir (LEVEMIR) 100 UNIT/ML FlexPen; Inject 100 Units into the skin every morning. -     Amb Referral to Nutrition and Diabetic Education -     Ambulatory referral to Endocrinology  Essential hypertension- His blood pressure is adequately well controlled. -     CBC with Differential/Platelet; Future -     Basic metabolic panel; Future -     Urinalysis, Routine w reflex microscopic; Future -     Hepatic function panel; Future -     Hepatic function  panel -     Urinalysis, Routine w reflex microscopic -     Basic metabolic panel -     CBC with Differential/Platelet  Hyperlipidemia with target LDL less than 100 -     Hepatic function panel; Future -     Hepatic function panel -     rosuvastatin (CRESTOR) 20 MG tablet; Take 1 tablet (20 mg total) by mouth daily.  Pleuritic chest pain- I recommended he undergo a chest x-ray.  His troponin and D-dimer are negative.  He has EKG changes and is high risk for CAD.  I recommended that he undergo myocardial perfusion imaging. -     Troponin I (High Sensitivity); Future -     D-dimer, quantitative; Future -     DG Chest 2 View; Future -     D-dimer, quantitative -     Troponin I (High Sensitivity) -     MYOCARDIAL PERFUSION IMAGING; Future -     Cardiac Stress Test: Informed Consent Details: Physician/Practitioner Attestation; Transcribe to consent form and obtain patient signature; Future  Tachycardia- Labs are negative for secondary causes. -     Troponin I (High Sensitivity); Future -     D-dimer, quantitative; Future -     DG Chest 2 View; Future -     D-dimer, quantitative -     Troponin I (High Sensitivity) -     MYOCARDIAL PERFUSION IMAGING; Future -     Cardiac Stress Test: Informed Consent Details: Physician/Practitioner Attestation; Transcribe to  consent form and obtain patient signature; Future  Acute pain of left shoulder -     Ambulatory referral to Orthopedic Surgery  Abnormal electrocardiogram (ECG) (EKG) -     MYOCARDIAL PERFUSION IMAGING; Future -     Cardiac Stress Test: Informed Consent Details: Physician/Practitioner Attestation; Transcribe to consent form and obtain patient signature; Future  Abnormal electrocardiogram -     MYOCARDIAL PERFUSION IMAGING; Future -     Cardiac Stress Test: Informed Consent Details: Physician/Practitioner Attestation; Transcribe to consent form and obtain patient signature; Future   I am having Garang Wilbur maintain his freestyle,  Contour Next Test, FREESTYLE LITE, indapamide, sildenafil, Cholecalciferol, Gvoke HypoPen 2-Pack, ramipril, methocarbamol, meloxicam, FreeStyle Libre 2 Sensor, [employer] 2 Reader, insulin lispro, metFORMIN, rosuvastatin, Pen Needles, and insulin detemir.  Meds ordered this encounter  Medications   Continuous Blood Gluc Sensor (FREESTYLE LIBRE 2 SENSOR) MISC    Sig: 1 Act by Does not apply route daily.    Dispense:  2 each    Refill:  5   Continuous Blood Gluc Receiver (FREESTYLE LIBRE 2 READER) DEVI    Sig: 1 Act by Does not apply route daily.    Dispense:  2 each    Refill:  5   insulin lispro (HUMALOG) 200 UNIT/ML KwikPen    Sig: Inject 10 Units into the skin 3 (three) times daily with meals.    Dispense:  9 mL    Refill:  1   metFORMIN (GLUCOPHAGE XR) 750 MG 24 hr tablet    Sig: Take 2 tablets (1,500 mg total) by mouth daily with breakfast.    Dispense:  180 tablet    Refill:  1   rosuvastatin (CRESTOR) 20 MG tablet    Sig: Take 1 tablet (20 mg total) by mouth daily.    Dispense:  90 tablet    Refill:  1   Insulin Pen Needle (PEN NEEDLES) 32G X 4 MM MISC    Sig: 1 each by Does not apply route in the morning, at noon, in the evening, and at bedtime. Use with insulin    Dispense:  400 each    Refill:  1   insulin detemir (LEVEMIR) 100 UNIT/ML FlexPen    Sig: Inject 100 Units into the skin every morning.    Dispense:  90 mL    Refill:  1     Follow-up: Return if symptoms worsen or fail to improve.  Sanda Linger, MD

## 2021-04-29 NOTE — Patient Instructions (Signed)
Nonspecific Chest Pain, Adult °Chest pain is an uncomfortable, tight, or painful feeling in the chest. The pain can feel like a crushing, aching, or squeezing pressure. A person can feel a burning or tingling sensation. Chest pain can also be felt in your back, neck, jaw, shoulder, or arm. This pain can be worse when you move, sneeze, or take a deep breath. °Chest pain can be caused by a condition that is life-threatening. This must be treated right away. It can also be caused by something that is not life-threatening. If you have chest pain, it can be hard to know the difference, so it is important to get help right away to make sure that you do not have a serious condition. °Some life-threatening causes of chest pain include: °Heart attack. °A tear in the body's main blood vessel (aortic dissection). °Inflammation around your heart (pericarditis). °A problem in the lungs, such as a blood clot (pulmonary embolism) or a collapsed lung (pneumothorax). °Some non life-threatening causes of chest pain include: °Heartburn. °Anxiety or stress. °Damage to the bones, muscles, and cartilage that make up your chest wall. °Pneumonia or bronchitis. °Shingles infection (varicella-zoster virus). °Your chest pain may come and go. It may also be constant. Your health care provider will do tests and other studies to find the cause of your pain. Treatment will depend on the cause of your chest pain. °Follow these instructions at home: °Medicines °Take over-the-counter and prescription medicines only as told by your health care provider. °If you were prescribed an antibiotic medicine, take it as told by your health care provider. Do not stop taking the antibiotic even if you start to feel better. °Activity °Avoid any activities that cause chest pain. °Do not lift anything that is heavier than 10 lb (4.5 kg), or the limit that you are told, until your health care provider says that it is safe. °Rest as directed by your health care  provider. °Return to your normal activities only as told by your health care provider. Ask your health care provider what activities are safe for you. °Lifestyle °  °Do not use any products that contain nicotine or tobacco, such as cigarettes, e-cigarettes, and chewing tobacco. If you need help quitting, ask your health care provider. °Do not drink alcohol. °Make healthy lifestyle changes as recommended. These may include: °Getting regular exercise. Ask your health care provider to suggest some exercises that are safe for you. °Eating a heart-healthy diet. This includes plenty of fresh fruits and vegetables, whole grains, low-fat (lean) protein, and low-fat dairy products. A dietitian can help you find healthy eating options. °Maintaining a healthy weight. °Managing any other health conditions you may have, such as high blood pressure (hypertension) or diabetes. °Reducing stress, such as with yoga or relaxation techniques. °General instructions °Pay attention to any changes in your symptoms. °It is up to you to get the results of any tests that were done. Ask your health care provider, or the department that is doing the tests, when your results will be ready. °Keep all follow-up visits as told by your health care provider. This is important. °You may be asked to go for further testing if your chest pain does not go away. °Contact a health care provider if: °Your chest pain does not go away. °You feel depressed. °You have a fever. °You notice changes in your symptoms or develop new symptoms. °Get help right away if: °Your chest pain gets worse. °You have a cough that gets worse, or you cough up   blood. °You have severe pain in your abdomen. °You faint. °You have sudden, unexplained chest discomfort. °You have sudden, unexplained discomfort in your arms, back, neck, or jaw. °You have shortness of breath at any time. °You suddenly start to sweat, or your skin gets clammy. °You feel nausea or you vomit. °You suddenly  feel lightheaded or dizzy. °You have severe weakness, or unexplained weakness or fatigue. °Your heart begins to beat quickly, or it feels like it is skipping beats. °These symptoms may represent a serious problem that is an emergency. Do not wait to see if the symptoms will go away. Get medical help right away. Call your local emergency services (911 in the [place] Do not drive yourself to the hospital. °Summary °Chest pain can be caused by a condition that is serious and requires urgent treatment. It may also be caused by something that is not life-threatening. °Your health care provider may do lab tests and other studies to find the cause of your pain. °Follow your health care provider's instructions on taking medicines, making lifestyle changes, and getting emergency treatment if symptoms become worse. °Keep all follow-up visits as told by your health care provider. This includes visits for any further testing if your chest pain does not go away. °This information is not intended to replace advice given to you by your health care provider. Make sure you discuss any questions you have with your health care provider. °Document Revised: 04/19/2020 Document Reviewed: 04/19/2020 °Elsevier Patient Education © 2022 Elsevier Inc. ° °

## 2021-04-30 MED ORDER — INSULIN DETEMIR 100 UNIT/ML FLEXPEN: 100.0000 [IU] | PEN_INJECTOR | SUBCUTANEOUS | 1 refills | Status: DC

## 2021-04-30 MED ORDER — FREESTYLE LIBRE 2 READER DEVI: 1.0000 | Freq: Every day | 5 refills | Status: DC

## 2021-04-30 MED ORDER — METFORMIN HCL ER 750 MG PO TB24: 1500.0000 mg | ORAL_TABLET | Freq: Every day | ORAL | 1 refills | Status: DC

## 2021-04-30 MED ORDER — PEN NEEDLES 32G X 4 MM MISC: 1.0000 | Freq: Four times a day (QID) | 1 refills | Status: DC

## 2021-04-30 MED ORDER — ROSUVASTATIN CALCIUM 20 MG PO TABS: 20.0000 mg | ORAL_TABLET | Freq: Every day | ORAL | 1 refills | Status: DC

## 2021-04-30 MED ORDER — FREESTYLE LIBRE 2 SENSOR MISC: 1.0000 | Freq: Every day | 5 refills | Status: DC

## 2021-04-30 MED ORDER — INSULIN LISPRO 200 UNIT/ML ~~LOC~~ SOPN: 10.0000 [IU] | PEN_INJECTOR | Freq: Three times a day (TID) | SUBCUTANEOUS | 1 refills | Status: DC

## 2021-05-03 ENCOUNTER — Encounter: Payer: Self-pay | Admitting: Internal Medicine

## 2021-05-03 DIAGNOSIS — R9431 Abnormal electrocardiogram [ECG] [EKG]: Secondary | ICD-10-CM | POA: Insufficient documentation

## 2021-05-29 ENCOUNTER — Telehealth: Payer: Self-pay | Admitting: Internal Medicine

## 2021-05-29 ENCOUNTER — Other Ambulatory Visit: Payer: Self-pay | Admitting: Internal Medicine

## 2021-05-29 DIAGNOSIS — N529 Male erectile dysfunction, unspecified: Secondary | ICD-10-CM

## 2021-05-29 MED ORDER — SILDENAFIL CITRATE 100 MG PO TABS: 100.0000 mg | ORAL_TABLET | Freq: Every day | ORAL | 5 refills | Status: DC | PRN

## 2021-05-29 NOTE — Telephone Encounter (Signed)
Pt called in and states he is going to pick up last refill on sildenafil (VIAGRA) 100 MG tablet. States he would like to have another prescription called in if possible.  ? ?If he needs another appt before filling, please contact pt and he will schedule.  ? ?[facility] [address] [place], [place] - [address] Phone:  [phone]  ?Fax:  [phone]  ?  ? ?

## 2021-07-01 ENCOUNTER — Other Ambulatory Visit: Payer: Self-pay | Admitting: Internal Medicine

## 2021-07-01 DIAGNOSIS — N529 Male erectile dysfunction, unspecified: Secondary | ICD-10-CM

## 2021-08-18 DIAGNOSIS — R1310 Dysphagia, unspecified: Secondary | ICD-10-CM | POA: Diagnosis not present

## 2021-08-18 DIAGNOSIS — J038 Acute tonsillitis due to other specified organisms: Secondary | ICD-10-CM | POA: Diagnosis not present

## 2021-08-18 DIAGNOSIS — Z20822 Contact with and (suspected) exposure to covid-19: Secondary | ICD-10-CM | POA: Diagnosis not present

## 2021-08-18 DIAGNOSIS — B9689 Other specified bacterial agents as the cause of diseases classified elsewhere: Secondary | ICD-10-CM | POA: Diagnosis not present

## 2021-11-03 ENCOUNTER — Other Ambulatory Visit: Payer: Self-pay | Admitting: Internal Medicine

## 2021-11-03 DIAGNOSIS — E139 Other specified diabetes mellitus without complications: Secondary | ICD-10-CM

## 2022-01-01 ENCOUNTER — Encounter: Payer: Self-pay | Admitting: Family Medicine

## 2022-01-01 ENCOUNTER — Telehealth (INDEPENDENT_AMBULATORY_CARE_PROVIDER_SITE_OTHER): Payer: BC Managed Care – PPO | Admitting: Family Medicine

## 2022-01-01 DIAGNOSIS — R109 Unspecified abdominal pain: Secondary | ICD-10-CM

## 2022-01-01 DIAGNOSIS — R197 Diarrhea, unspecified: Secondary | ICD-10-CM | POA: Diagnosis not present

## 2022-01-01 NOTE — Progress Notes (Signed)
MyChart Video Visit    Virtual Visit via Video Note   This visit type was conducted due to national recommendations for restrictions regarding the COVID-19 Pandemic (e.g. social distancing) in an effort to limit this patient's exposure and mitigate transmission in our community. This patient is at least at moderate risk for complications without adequate follow up. This format is felt to be most appropriate for this patient at this time. Physical exam was limited by quality of the video and audio technology used for the visit. CMA was able to get the patient set up on a video visit.  Patient location: Home. Patient and provider in visit Provider location: Office  I discussed the limitations of evaluation and management by telemedicine and the availability of in person appointments. The patient expressed understanding and agreed to proceed.  Visit Date: 01/01/2022  Today's healthcare provider: Hetty Blend, NP-C     Subjective:    Patient ID: Joseph [name], male    DOB: December 21, 1980, 41 y.o.   MRN: [id number]  Chief Complaint  Patient presents with   Diarrhea    X7 days    HPI  C/o a 7 day hx of diarrhea and abdominal pain. Watery and mucous diarrhea. Intermittent dark stools.  Fever and chills on day 1 of symptoms.     Unable to eat solid food due to pain. Drinking fluids and having soups and noodles.   Every time he eats he has to go to the bathroom within 10-15 minutes.   He has taken Pepto-Bismol  No new medications.  No travel   Denies dizziness, chest pain, palpitations, shortness of breath, nausea, vomiting or urinary symptoms.     Past Medical History:  Diagnosis Date   Diabetes mellitus without complication (HCC)    insulin treated type 2   GERD (gastroesophageal reflux disease)    Hyperlipidemia    Hypertension     Past Surgical History:  Procedure Laterality Date   APPENDECTOMY     attempted removal, too much swelling   IR RADIOLOGIST EVAL &  MGMT  05/14/2016   LAPAROSCOPIC APPENDECTOMY  06/25/2016   LAPAROSCOPIC APPENDECTOMY N/A 06/25/2016   Procedure: LAPAROSCOPIC APPENDECTOMY;  Surgeon: Griselda Miner, MD;  Location: [facility] OR;  Service: General;  Laterality: N/A;   wisdom teeth pulled      Family History  Problem Relation Age of Onset   Diabetes Father    Hypertension Maternal Grandmother    Cancer Neg Hx    Early death Neg Hx    Heart disease Neg Hx    Hyperlipidemia Neg Hx    Kidney disease Neg Hx    Learning disabilities Neg Hx    Stroke Neg Hx     Social History   Socioeconomic History   Marital status: Single    Spouse name: Not on file   Number of children: Not on file   Years of education: Not on file   Highest education level: Not on file  Occupational History   Not on file  Tobacco Use   Smoking status: Former    Types: Cigarettes    Quit date: 04/18/2016    Years since quitting: 5.7   Smokeless tobacco: Never   Tobacco comments:    hasnt smoked since march  Vaping Use   Vaping Use: Never used  Substance and Sexual Activity   Alcohol use: No   Drug use: No   Sexual activity: Not Currently  Other Topics Concern   Not on file  Social History Narrative   Not on file   Social Determinants of Health   Financial Resource Strain: Not on file  Food Insecurity: Not on file  Transportation Needs: Not on file  Physical Activity: Not on file  Stress: Not on file  Social Connections: Not on file  Intimate Partner Violence: Not on file    Outpatient Medications Prior to Visit  Medication Sig Dispense Refill   Cholecalciferol 1.25 MG (50000 UT) capsule Take 1 capsule (50,000 Units total) by mouth once a week. 12 capsule 1   Continuous Blood Gluc Receiver (FREESTYLE LIBRE 2 READER) DEVI 1 Act by Does not apply route daily. 2 each 5   Continuous Blood Gluc Sensor (FREESTYLE LIBRE 2 SENSOR) MISC 1 Act by Does not apply route daily. 2 each 5   FREESTYLE LITE test strip USE TO TEST DAILY (INS PAYS FOR 30  DAYS) 100 strip 3   Glucagon (GVOKE HYPOPEN 2-PACK) 1 MG/0.2ML SOAJ Inject 1 Act into the skin daily as needed. 2 mL 5   glucose blood (CONTOUR NEXT TEST) test strip 1 each by Other route in the morning and at bedtime. And lancets 2/day 180 each 3   indapamide (LOZOL) 1.25 MG tablet Take 1 tablet (1.25 mg total) by mouth daily. 90 tablet 0   insulin detemir (LEVEMIR) 100 UNIT/ML FlexPen Inject 100 Units into the skin every morning. 90 mL 1   insulin lispro (HUMALOG) 200 UNIT/ML KwikPen Inject 10 Units into the skin 3 (three) times daily with meals. 9 mL 1   Insulin Pen Needle (PEN NEEDLES) 32G X 4 MM MISC 1 each by Does not apply route in the morning, at noon, in the evening, and at bedtime. Use with insulin 400 each 1   Lancets (FREESTYLE) lancets 1 each by Other route daily. E11.9 100 each 0   meloxicam (MOBIC) 15 MG tablet Take 15 mg by mouth daily.     metFORMIN (GLUCOPHAGE XR) 750 MG 24 hr tablet Take 2 tablets (1,500 mg total) by mouth daily with breakfast. 180 tablet 1   methocarbamol (ROBAXIN) 500 MG tablet Take 1,000 mg by mouth 2 (two) times daily.     ramipril (ALTACE) 10 MG capsule Take 1 capsule (10 mg total) by mouth daily. 90 capsule 1   rosuvastatin (CRESTOR) 20 MG tablet Take 1 tablet (20 mg total) by mouth daily. 90 tablet 1   sildenafil (VIAGRA) 100 MG tablet TAKE 1 TABLET BY MOUTH ONCE DAILY AS NEEDED FOR ERECTILE DYSFUNCTION 20 tablet 0   No facility-administered medications prior to visit.    Allergies  Allergen Reactions   Bee Venom Anaphylaxis    ROS     Objective:    Physical Exam  There were no vitals taken for this visit. Wt Readings from Last 3 Encounters:  04/29/21 239 lb (108.4 kg)  05/29/20 249 lb 9.6 oz (113.2 kg)  08/16/19 247 lb 9.6 oz (112.3 kg)   Alert and oriented and in no acute distress. Respirations unlabored.     Assessment & Plan:   Problem List Items Addressed This Visit   None Visit Diagnoses     Diarrhea, unspecified type    -   Primary   Abdominal pain, unspecified abdominal location          Discussed that he should be seen in the office for an exam, labs and potentially stool studies. Currently afebrile and no sign of obstruction.  He will be scheduled in office tomorrow. In the meantime,  if he is worsening, he will go to the ED for evaluation.   I am having Juvenal Umar maintain his freestyle, Contour Next Test, FREESTYLE LITE, indapamide, Cholecalciferol, Gvoke HypoPen 2-Pack, ramipril, methocarbamol, meloxicam, FreeStyle Libre 2 Sensor, [employer] 2 Reader, insulin lispro, metFORMIN, rosuvastatin, Pen Needles, insulin detemir, and sildenafil.  No orders of the defined types were placed in this encounter.   I discussed the assessment and treatment plan with the patient. The patient was provided an opportunity to ask questions and all were answered. The patient agreed with the plan and demonstrated an understanding of the instructions.   The patient was advised to call back or seek an in-person evaluation if the symptoms worsen or if the condition fails to improve as anticipated.  I provided 18 minutes of face-to-face time during this encounter.   Hetty Blend, NP-C [facility] [place] [phone] (phone) [phone] (fax)  [facility]

## 2022-01-02 ENCOUNTER — Encounter: Payer: Self-pay | Admitting: Internal Medicine

## 2022-01-02 ENCOUNTER — Ambulatory Visit (INDEPENDENT_AMBULATORY_CARE_PROVIDER_SITE_OTHER): Payer: BC Managed Care – PPO | Admitting: Internal Medicine

## 2022-01-02 VITALS — BP 128/72 | HR 87 | Temp 97.8°F | Ht 72.0 in | Wt 233.0 lb

## 2022-01-02 DIAGNOSIS — I10 Essential (primary) hypertension: Secondary | ICD-10-CM

## 2022-01-02 DIAGNOSIS — K529 Noninfective gastroenteritis and colitis, unspecified: Secondary | ICD-10-CM

## 2022-01-02 DIAGNOSIS — E139 Other specified diabetes mellitus without complications: Secondary | ICD-10-CM | POA: Diagnosis not present

## 2022-01-02 MED ORDER — DIPHENOXYLATE-ATROPINE 2.5-0.025 MG PO TABS: 1.0000 | ORAL_TABLET | Freq: Four times a day (QID) | ORAL | 0 refills | Status: DC | PRN

## 2022-01-02 NOTE — Progress Notes (Signed)
Patient ID: Joseph [name], male   DOB: 10-31-1980, 41 y.o.   MRN: [id number]        Chief Complaint: follow up GI symptoms       HPI:  Joseph [name] is a 41 y.o. male here with c/o 5 days onset feverish and n/v with diarrhea and crampy abd pain, watery loose stool, can't leave the house, supposed to go to work this evening.  Denies high fever, chills, and Denies worsening reflux, dysphagia, or blood.  Pt denies chest pain, increased sob or doe, wheezing, orthopnea, PND, increased LE swelling, palpitations, dizziness or syncope.   Pt denies polydipsia, polyuria, or new focal neuro s/s.          Wt Readings from Last 3 Encounters:  01/02/22 233 lb (105.7 kg)  04/29/21 239 lb (108.4 kg)  05/29/20 249 lb 9.6 oz (113.2 kg)   BP Readings from Last 3 Encounters:  01/02/22 128/72  04/29/21 126/78  05/29/20 (!) 148/98         Past Medical History:  Diagnosis Date   Diabetes mellitus without complication (HCC)    insulin treated type 2   GERD (gastroesophageal reflux disease)    Hyperlipidemia    Hypertension    Past Surgical History:  Procedure Laterality Date   APPENDECTOMY     attempted removal, too much swelling   IR RADIOLOGIST EVAL & MGMT  05/14/2016   LAPAROSCOPIC APPENDECTOMY  06/25/2016   LAPAROSCOPIC APPENDECTOMY N/A 06/25/2016   Procedure: LAPAROSCOPIC APPENDECTOMY;  Surgeon: Griselda Miner, MD;  Location: [facility] OR;  Service: General;  Laterality: N/A;   wisdom teeth pulled      reports that he quit smoking about 5 years ago. He has never used smokeless tobacco. He reports that he does not drink alcohol and does not use drugs. family history includes Diabetes in his father; Hypertension in his maternal grandmother. Allergies  Allergen Reactions   Bee Venom Anaphylaxis   Current Outpatient Medications on File Prior to Visit  Medication Sig Dispense Refill   Cholecalciferol 1.25 MG (50000 UT) capsule Take 1 capsule (50,000 Units total) by mouth once a week. 12 capsule 1    Continuous Blood Gluc Receiver (FREESTYLE LIBRE 2 READER) DEVI 1 Act by Does not apply route daily. 2 each 5   Continuous Blood Gluc Sensor (FREESTYLE LIBRE 2 SENSOR) MISC 1 Act by Does not apply route daily. 2 each 5   FREESTYLE LITE test strip USE TO TEST DAILY (INS PAYS FOR 30 DAYS) 100 strip 3   Glucagon (GVOKE HYPOPEN 2-PACK) 1 MG/0.2ML SOAJ Inject 1 Act into the skin daily as needed. 2 mL 5   glucose blood (CONTOUR NEXT TEST) test strip 1 each by Other route in the morning and at bedtime. And lancets 2/day 180 each 3   indapamide (LOZOL) 1.25 MG tablet Take 1 tablet (1.25 mg total) by mouth daily. 90 tablet 0   insulin detemir (LEVEMIR) 100 UNIT/ML FlexPen Inject 100 Units into the skin every morning. 90 mL 1   insulin lispro (HUMALOG) 200 UNIT/ML KwikPen Inject 10 Units into the skin 3 (three) times daily with meals. 9 mL 1   Insulin Pen Needle (PEN NEEDLES) 32G X 4 MM MISC 1 each by Does not apply route in the morning, at noon, in the evening, and at bedtime. Use with insulin 400 each 1   Lancets (FREESTYLE) lancets 1 each by Other route daily. E11.9 100 each 0   meloxicam (MOBIC) 15 MG tablet Take 15  mg by mouth daily.     metFORMIN (GLUCOPHAGE XR) 750 MG 24 hr tablet Take 2 tablets (1,500 mg total) by mouth daily with breakfast. 180 tablet 1   methocarbamol (ROBAXIN) 500 MG tablet Take 1,000 mg by mouth 2 (two) times daily.     ramipril (ALTACE) 10 MG capsule Take 1 capsule (10 mg total) by mouth daily. 90 capsule 1   rosuvastatin (CRESTOR) 20 MG tablet Take 1 tablet (20 mg total) by mouth daily. 90 tablet 1   sildenafil (VIAGRA) 100 MG tablet TAKE 1 TABLET BY MOUTH ONCE DAILY AS NEEDED FOR ERECTILE DYSFUNCTION 20 tablet 0   No current facility-administered medications on file prior to visit.        ROS:  All others reviewed and negative.  Objective        PE:  BP 128/72 (BP Location: Right Arm, Patient Position: Sitting, Cuff Size: Large)   Pulse 87   Temp 97.8 F (36.6 C) (Oral)    Ht 6' (1.829 m)   Wt 233 lb (105.7 kg)   SpO2 95%   BMI 31.60 kg/m                 Constitutional: Pt appears in NAD               HENT: Head: NCAT.                Right Ear: External ear normal.                 Left Ear: External ear normal.                Eyes: . Pupils are equal, round, and reactive to light. Conjunctivae and EOM are normal               Nose: without d/c or deformity               Neck: Neck supple. Gross normal ROM               Cardiovascular: Normal rate and regular rhythm.                 Pulmonary/Chest: Effort normal and breath sounds without rales or wheezing.                Abd:  Soft, NT, ND, + BS, no organomegaly               Neurological: Pt is alert. At baseline orientation, motor grossly intact               Skin: Skin is warm. No rashes, no other new lesions, LE edema - none               Psychiatric: Pt behavior is normal without agitation   Micro: none  Cardiac tracings I have personally interpreted today:  none  Pertinent Radiological findings (summarize): none   Lab Results  Component Value Date   WBC 6.1 04/29/2021   HGB 14.7 04/29/2021   HCT 43.7 04/29/2021   PLT 239.0 04/29/2021   GLUCOSE 347 (H) 04/29/2021   CHOL 162 05/29/2020   TRIG 59.0 05/29/2020   HDL 42.00 05/29/2020   LDLCALC 108 (H) 05/29/2020   ALT 27 04/29/2021   AST 16 04/29/2021   NA 137 04/29/2021   K 3.9 04/29/2021   CL 102 04/29/2021   CREATININE 0.91 04/29/2021   BUN 14 04/29/2021   CO2 26 04/29/2021   TSH  2.11 05/29/2020   PSA 0.75 05/29/2020   INR 1.00 05/02/2016   HGBA1C 11.1 (H) 04/29/2021   MICROALBUR 16.2 (H) 04/29/2021   Assessment/Plan:  Joseph [name] is a 41 y.o. Black or African American [2] male with  has a past medical history of Diabetes mellitus without complication (HCC), GERD (gastroesophageal reflux disease), Hyperlipidemia, and Hypertension.  Acute gastroenteritis Most likely viral it seems, nausea better but with persistent  diarrhea, declines cbc, bmp lfts, but will return for any worsening, ok for lomotil prn  , and gave work note  Essential hypertension BP Readings from Last 3 Encounters:  01/02/22 128/72  04/29/21 126/78  05/29/20 (!) 148/98   Stable, pt to continue medical treatment altace 10 mg qd, lozol 1.25 mg qd   Diabetes 1.5, managed as type 1 (HCC) Lab Results  Component Value Date   HGBA1C 11.1 (H) 04/29/2021   Severe uncontrolled recently, pt to continue current medical treatment levemir 100 u, novolog 10u tid AC, metformin ER 750mg  - 2 qd, and needs close f/u with Endo, declines other change today  Followup: Return if symptoms worsen or fail to improve.  , MD 01/02/2022 8:05 PM [facility]

## 2022-01-02 NOTE — Patient Instructions (Signed)
Please take all new medication as prescribed - the lomotil for diarrhea  You are given the work note  Please continue all other medications as before, and refills have been done if requested.  Please have the pharmacy call with any other refills you may need.  Please keep your appointments with your specialists as you may have planned

## 2022-01-02 NOTE — Assessment & Plan Note (Addendum)
Most likely viral it seems, nausea better but with persistent diarrhea, declines cbc, bmp lfts, but will return for any worsening, ok for lomotil prn  , and gave work note

## 2022-01-02 NOTE — Assessment & Plan Note (Signed)
BP Readings from Last 3 Encounters:  01/02/22 128/72  04/29/21 126/78  05/29/20 (!) 148/98   Stable, pt to continue medical treatment altace 10 mg qd, lozol 1.25 mg qd

## 2022-01-02 NOTE — Assessment & Plan Note (Addendum)
Lab Results  Component Value Date   HGBA1C 11.1 (H) 04/29/2021   Severe uncontrolled recently, pt to continue current medical treatment levemir 100 u, novolog 10u tid AC, metformin ER 750mg  - 2 qd, and needs close f/u with Endo, declines other change today

## 2022-01-15 ENCOUNTER — Other Ambulatory Visit: Payer: Self-pay | Admitting: Internal Medicine

## 2022-01-15 ENCOUNTER — Telehealth: Payer: Self-pay | Admitting: Internal Medicine

## 2022-01-15 DIAGNOSIS — E139 Other specified diabetes mellitus without complications: Secondary | ICD-10-CM

## 2022-01-15 MED ORDER — INSULIN LISPRO 200 UNIT/ML ~~LOC~~ SOPN: 10.0000 [IU] | PEN_INJECTOR | Freq: Three times a day (TID) | SUBCUTANEOUS | 0 refills | Status: DC

## 2022-01-15 NOTE — Telephone Encounter (Signed)
Called pt, LVM to discuss setting up an OV.

## 2022-01-15 NOTE — Telephone Encounter (Signed)
Patient would like him humalog RX refilled - Please send to [facility] on [address] in [place], [place]

## 2022-01-16 ENCOUNTER — Other Ambulatory Visit: Payer: Self-pay | Admitting: Internal Medicine

## 2022-01-16 DIAGNOSIS — E139 Other specified diabetes mellitus without complications: Secondary | ICD-10-CM

## 2022-04-11 ENCOUNTER — Other Ambulatory Visit: Payer: Self-pay | Admitting: Internal Medicine

## 2022-04-11 DIAGNOSIS — E139 Other specified diabetes mellitus without complications: Secondary | ICD-10-CM

## 2022-06-19 ENCOUNTER — Telehealth: Payer: Self-pay | Admitting: Internal Medicine

## 2022-06-19 DIAGNOSIS — N529 Male erectile dysfunction, unspecified: Secondary | ICD-10-CM

## 2022-06-19 NOTE — Telephone Encounter (Signed)
Prescription Request  06/19/2022  LOV: Visit date not found  What is the name of the medication or equipment? sildinifil  Have you contacted your pharmacy to request a refill? Yes   Which pharmacy would you like this sent to?   [facility] - in [place]    Patient notified that their request is being sent to the clinical staff for review and that they should receive a response within 2 business days.   Please advise at Mobile [phone] (mobile)

## 2022-06-19 NOTE — Telephone Encounter (Signed)
Request has been denied already due to pt needing appt by provider. Last saw provider 04/29/21

## 2022-06-25 ENCOUNTER — Other Ambulatory Visit: Payer: Self-pay | Admitting: Internal Medicine

## 2022-06-25 DIAGNOSIS — N529 Male erectile dysfunction, unspecified: Secondary | ICD-10-CM

## 2022-07-03 ENCOUNTER — Telehealth: Payer: Self-pay | Admitting: Internal Medicine

## 2022-07-03 ENCOUNTER — Encounter: Payer: Self-pay | Admitting: Internal Medicine

## 2022-07-03 ENCOUNTER — Ambulatory Visit: Payer: PRIVATE HEALTH INSURANCE | Admitting: Internal Medicine

## 2022-07-03 VITALS — BP 138/94 | HR 81 | Temp 97.6°F | Resp 16 | Ht 72.0 in | Wt 239.0 lb

## 2022-07-03 DIAGNOSIS — E785 Hyperlipidemia, unspecified: Secondary | ICD-10-CM

## 2022-07-03 DIAGNOSIS — B351 Tinea unguium: Secondary | ICD-10-CM

## 2022-07-03 DIAGNOSIS — Z0001 Encounter for general adult medical examination with abnormal findings: Secondary | ICD-10-CM | POA: Diagnosis not present

## 2022-07-03 DIAGNOSIS — I10 Essential (primary) hypertension: Secondary | ICD-10-CM | POA: Diagnosis not present

## 2022-07-03 DIAGNOSIS — E559 Vitamin D deficiency, unspecified: Secondary | ICD-10-CM | POA: Diagnosis not present

## 2022-07-03 DIAGNOSIS — R9431 Abnormal electrocardiogram [ECG] [EKG]: Secondary | ICD-10-CM | POA: Diagnosis not present

## 2022-07-03 DIAGNOSIS — B353 Tinea pedis: Secondary | ICD-10-CM

## 2022-07-03 DIAGNOSIS — R21 Rash and other nonspecific skin eruption: Secondary | ICD-10-CM

## 2022-07-03 DIAGNOSIS — E1069 Type 1 diabetes mellitus with other specified complication: Secondary | ICD-10-CM

## 2022-07-03 DIAGNOSIS — N529 Male erectile dysfunction, unspecified: Secondary | ICD-10-CM

## 2022-07-03 DIAGNOSIS — E139 Other specified diabetes mellitus without complications: Secondary | ICD-10-CM

## 2022-07-03 LAB — LIPID PANEL
Cholesterol: 188 mg/dL (ref 0–200)
HDL: 36.3 mg/dL — ABNORMAL LOW (ref >39.00)
LDL Cholesterol: 137 mg/dL — ABNORMAL HIGH (ref 0–99)
NonHDL: 151.72
Total CHOL/HDL Ratio: 5
Triglycerides: 76 mg/dL (ref 0.0–149.0)
VLDL: 15.2 mg/dL (ref 0.0–40.0)

## 2022-07-03 LAB — TSH: TSH: 1.24 u[IU]/mL (ref 0.35–5.50)

## 2022-07-03 LAB — CBC WITH DIFFERENTIAL/PLATELET
Basophils Absolute: 0 10*3/uL (ref 0.0–0.1)
Basophils Relative: 0.7 % (ref 0.0–3.0)
Eosinophils Absolute: 0 10*3/uL (ref 0.0–0.7)
Eosinophils Relative: 0.8 % (ref 0.0–5.0)
HCT: 44.5 % (ref 39.0–52.0)
Hemoglobin: 15.2 g/dL (ref 13.0–17.0)
Lymphocytes Relative: 45.8 % (ref 12.0–46.0)
Lymphs Abs: 2.3 10*3/uL (ref 0.7–4.0)
MCHC: 34.2 g/dL (ref 30.0–36.0)
MCV: 90.4 fl (ref 78.0–100.0)
Monocytes Absolute: 0.5 10*3/uL (ref 0.1–1.0)
Monocytes Relative: 9.4 % (ref 3.0–12.0)
Neutro Abs: 2.2 10*3/uL (ref 1.4–7.7)
Neutrophils Relative %: 43.3 % (ref 43.0–77.0)
Platelets: 257 10*3/uL (ref 150.0–400.0)
RBC: 4.92 Mil/uL (ref 4.22–5.81)
RDW: 12.5 % (ref 11.5–15.5)
WBC: 5 10*3/uL (ref 4.0–10.5)

## 2022-07-03 LAB — HEPATIC FUNCTION PANEL
ALT: 25 U/L (ref 0–53)
AST: 17 U/L (ref 0–37)
Albumin: 4.2 g/dL (ref 3.5–5.2)
Alkaline Phosphatase: 73 U/L (ref 39–117)
Bilirubin, Direct: 0.1 mg/dL (ref 0.0–0.3)
Total Bilirubin: 0.9 mg/dL (ref 0.2–1.2)
Total Protein: 7.2 g/dL (ref 6.0–8.3)

## 2022-07-03 LAB — URINALYSIS, ROUTINE W REFLEX MICROSCOPIC
Bilirubin Urine: NEGATIVE
Hgb urine dipstick: NEGATIVE
Leukocytes,Ua: NEGATIVE
Nitrite: NEGATIVE
RBC / HPF: NONE SEEN (ref 0–?)
Specific Gravity, Urine: 1.02 (ref 1.000–1.030)
Total Protein, Urine: NEGATIVE
Urine Glucose: >=1000 — AB
Urobilinogen, UA: 1 (ref 0.0–1.0)
WBC, UA: NONE SEEN (ref 0–?)
pH: 6 (ref 5.0–8.0)

## 2022-07-03 LAB — BASIC METABOLIC PANEL
BUN: 13 mg/dL (ref 6–23)
CO2: 27 mEq/L (ref 19–32)
Calcium: 9.3 mg/dL (ref 8.4–10.5)
Chloride: 102 mEq/L (ref 96–112)
Creatinine, Ser: 0.81 mg/dL (ref 0.40–1.50)
GFR: 108.15 mL/min (ref >60.00)
Glucose, Bld: 273 mg/dL — ABNORMAL HIGH (ref 70–99)
Potassium: 4.2 mEq/L (ref 3.5–5.1)
Sodium: 137 mEq/L (ref 135–145)

## 2022-07-03 LAB — VITAMIN D 25 HYDROXY (VIT D DEFICIENCY, FRACTURES): VITD: 7.57 ng/mL — ABNORMAL LOW (ref 30.00–100.00)

## 2022-07-03 LAB — MICROALBUMIN / CREATININE URINE RATIO
Creatinine,U: 105.7 mg/dL
Microalb Creat Ratio: 2.5 mg/g (ref 0.0–30.0)
Microalb, Ur: 2.6 mg/dL — ABNORMAL HIGH (ref 0.0–1.9)

## 2022-07-03 LAB — HEMOGLOBIN A1C: Hgb A1c MFr Bld: 10.7 % — ABNORMAL HIGH (ref 4.6–6.5)

## 2022-07-03 LAB — PSA: PSA: 1.08 ng/mL (ref 0.10–4.00)

## 2022-07-03 MED ORDER — INSULIN DETEMIR 100 UNIT/ML FLEXPEN
100.0000 [IU] | PEN_INJECTOR | SUBCUTANEOUS | 1 refills | Status: AC
Start: 2022-07-03 — End: ?

## 2022-07-03 MED ORDER — CHOLECALCIFEROL 1.25 MG (50000 UT) PO CAPS
50000.0000 [IU] | ORAL_CAPSULE | ORAL | 0 refills | Status: AC
Start: 2022-07-03 — End: ?

## 2022-07-03 MED ORDER — INDAPAMIDE 1.25 MG PO TABS
1.2500 mg | ORAL_TABLET | Freq: Every day | ORAL | 0 refills | Status: AC
Start: 2022-07-03 — End: ?

## 2022-07-03 MED ORDER — PEN NEEDLES 32G X 4 MM MISC
1.0000 | Freq: Four times a day (QID) | 1 refills | Status: AC
Start: 2022-07-03 — End: ?

## 2022-07-03 MED ORDER — ROSUVASTATIN CALCIUM 20 MG PO TABS
20.0000 mg | ORAL_TABLET | Freq: Every day | ORAL | 1 refills | Status: AC
Start: 2022-07-03 — End: ?

## 2022-07-03 MED ORDER — INSULIN LISPRO 200 UNIT/ML ~~LOC~~ SOPN
10.0000 [IU] | PEN_INJECTOR | Freq: Three times a day (TID) | SUBCUTANEOUS | 0 refills | Status: AC
Start: 2022-07-03 — End: ?

## 2022-07-03 MED ORDER — FREESTYLE LIBRE 2 READER DEVI
1.0000 | Freq: Every day | 5 refills | Status: AC
Start: 2022-07-03 — End: ?

## 2022-07-03 MED ORDER — FREESTYLE LIBRE 2 SENSOR MISC
1.0000 | Freq: Every day | 5 refills | Status: AC
Start: 2022-07-03 — End: ?

## 2022-07-03 MED ORDER — METFORMIN HCL ER 750 MG PO TB24
1500.0000 mg | ORAL_TABLET | Freq: Every day | ORAL | 0 refills | Status: AC
Start: 2022-07-03 — End: ?

## 2022-07-03 MED ORDER — GVOKE HYPOPEN 2-PACK 1 MG/0.2ML ~~LOC~~ SOAJ
1.0000 | Freq: Every day | SUBCUTANEOUS | 5 refills | Status: AC | PRN
Start: 2022-07-03 — End: ?

## 2022-07-03 NOTE — Telephone Encounter (Signed)
Patient dropped off document Diabetes Assessment Form, to be filled out by provider. Patient requested to send it via Fax within 7-days. Document is located in providers tray at front office.Please advise at Mobile [phone] (mobile)

## 2022-07-03 NOTE — Patient Instructions (Signed)
Health Maintenance, Male Adopting a healthy lifestyle and getting preventive care are important in promoting health and wellness. Ask your health care provider about: The right schedule for you to have regular tests and exams. Things you can do on your own to prevent diseases and keep yourself healthy. What should I know about diet, weight, and exercise? Eat a healthy diet  Eat a diet that includes plenty of vegetables, fruits, low-fat dairy products, and lean protein. Do not eat a lot of foods that are high in solid fats, added sugars, or sodium. Maintain a healthy weight Body mass index (BMI) is a measurement that can be used to identify possible weight problems. It estimates body fat based on height and weight. Your health care provider can help determine your BMI and help you achieve or maintain a healthy weight. Get regular exercise Get regular exercise. This is one of the most important things you can do for your health. Most adults should: Exercise for at least 150 minutes each week. The exercise should increase your heart rate and make you sweat (moderate-intensity exercise). Do strengthening exercises at least twice a week. This is in addition to the moderate-intensity exercise. Spend less time sitting. Even light physical activity can be beneficial. Watch cholesterol and blood lipids Have your blood tested for lipids and cholesterol at 43 years of age, then have this test every 5 years. You may need to have your cholesterol levels checked more often if: Your lipid or cholesterol levels are high. You are older than 43 years of age. You are at high risk for heart disease. What should I know about cancer screening? Many types of cancers can be detected early and may often be prevented. Depending on your health history and family history, you may need to have cancer screening at various ages. This may include screening for: Colorectal cancer. Prostate cancer. Skin cancer. Lung  cancer. What should I know about heart disease, diabetes, and high blood pressure? Blood pressure and heart disease High blood pressure causes heart disease and increases the risk of stroke. This is more likely to develop in people who have high blood pressure readings or are overweight. Talk with your health care provider about your target blood pressure readings. Have your blood pressure checked: Every 3-5 years if you are 18-39 years of age. Every year if you are 40 years old or older. If you are between the ages of 65 and 75 and are a current or former smoker, ask your health care provider if you should have a one-time screening for abdominal aortic aneurysm (AAA). Diabetes Have regular diabetes screenings. This checks your fasting blood sugar level. Have the screening done: Once every three years after age 45 if you are at a normal weight and have a low risk for diabetes. More often and at a younger age if you are overweight or have a high risk for diabetes. What should I know about preventing infection? Hepatitis B If you have a higher risk for hepatitis B, you should be screened for this virus. Talk with your health care provider to find out if you are at risk for hepatitis B infection. Hepatitis C Blood testing is recommended for: Everyone born from 1945 through 1965. Anyone with known risk factors for hepatitis C. Sexually transmitted infections (STIs) You should be screened each year for STIs, including gonorrhea and chlamydia, if: You are sexually active and are younger than 43 years of age. You are older than 43 years of age and your   health care provider tells you that you are at risk for this type of infection. Your sexual activity has changed since you were last screened, and you are at increased risk for chlamydia or gonorrhea. Ask your health care provider if you are at risk. Ask your health care provider about whether you are at high risk for HIV. Your health care provider  may recommend a prescription medicine to help prevent HIV infection. If you choose to take medicine to prevent HIV, you should first get tested for HIV. You should then be tested every 3 months for as long as you are taking the medicine. Follow these instructions at home: Alcohol use Do not drink alcohol if your health care provider tells you not to drink. If you drink alcohol: Limit how much you have to 0-2 drinks a day. Know how much alcohol is in your drink. In the [place] one drink equals one 12 oz bottle of beer (355 mL), one 5 oz glass of wine (148 mL), or one 1 oz glass of hard liquor (44 mL). Lifestyle Do not use any products that contain nicotine or tobacco. These products include cigarettes, chewing tobacco, and vaping devices, such as e-cigarettes. If you need help quitting, ask your health care provider. Do not use street drugs. Do not share needles. Ask your health care provider for help if you need support or information about quitting drugs. General instructions Schedule regular health, dental, and eye exams. Stay current with your vaccines. Tell your health care provider if: You often feel depressed. You have ever been abused or do not feel safe at home. Summary Adopting a healthy lifestyle and getting preventive care are important in promoting health and wellness. Follow your health care provider's instructions about healthy diet, exercising, and getting tested or screened for diseases. Follow your health care provider's instructions on monitoring your cholesterol and blood pressure. This information is not intended to replace advice given to you by your health care provider. Make sure you discuss any questions you have with your health care provider. Document Revised: 06/25/2020 Document Reviewed: 06/25/2020 Elsevier Patient Education  2023 [employer]  

## 2022-07-03 NOTE — Progress Notes (Unsigned)
Subjective:  Patient ID: Joseph [name], male    DOB: 1980-07-27  Age: 42 y.o. MRN: [id number]  CC: Annual Exam, Hypertension, Hyperlipidemia, Diabetes, and Rash   HPI Joseph [name] presents for a CPX and f/up ----  He tells me he is using the basal insulin but not the mealtime insulin or taking metformin.  He complains of polys.  He is active and denies chest pain, shortness of breath, diaphoresis, or edema.   Outpatient Medications Prior to Visit  Medication Sig Dispense Refill   Continuous Blood Gluc Receiver (FREESTYLE LIBRE 2 READER) DEVI 1 Act by Does not apply route daily. 2 each 5   Continuous Blood Gluc Sensor (FREESTYLE LIBRE 2 SENSOR) MISC 1 Act by Does not apply route daily. 2 each 5   FREESTYLE LITE test strip USE TO TEST DAILY (INS PAYS FOR 30 DAYS) 100 strip 3   glucose blood (CONTOUR NEXT TEST) test strip 1 each by Other route in the morning and at bedtime. And lancets 2/day 180 each 3   insulin detemir (LEVEMIR) 100 UNIT/ML FlexPen Inject 100 Units into the skin every morning. 90 mL 1   Insulin Pen Needle (PEN NEEDLES) 32G X 4 MM MISC 1 each by Does not apply route in the morning, at noon, in the evening, and at bedtime. Use with insulin 400 each 1   Lancets (FREESTYLE) lancets 1 each by Other route daily. E11.9 100 each 0   sildenafil (VIAGRA) 100 MG tablet TAKE 1 TABLET BY MOUTH ONCE DAILY AS NEEDED FOR ERECTILE DYSFUNCTION 20 tablet 0   Cholecalciferol 1.25 MG (50000 UT) capsule Take 1 capsule (50,000 Units total) by mouth once a week. (Patient not taking: Reported on 07/03/2022) 12 capsule 1   diphenoxylate-atropine (LOMOTIL) 2.5-0.025 MG tablet Take 1 tablet by mouth 4 (four) times daily as needed for diarrhea or loose stools. (Patient not taking: Reported on 07/03/2022) 30 tablet 0   Glucagon (GVOKE HYPOPEN 2-PACK) 1 MG/0.2ML SOAJ Inject 1 Act into the skin daily as needed. (Patient not taking: Reported on 07/03/2022) 2 mL 5   indapamide (LOZOL) 1.25 MG  tablet Take 1 tablet (1.25 mg total) by mouth daily. (Patient not taking: Reported on 07/03/2022) 90 tablet 0   insulin lispro (HUMALOG) 200 UNIT/ML KwikPen Inject 10 Units into the skin 3 (three) times daily with meals. (Patient not taking: Reported on 07/03/2022) 3 mL 0   meloxicam (MOBIC) 15 MG tablet Take 15 mg by mouth daily. (Patient not taking: Reported on 07/03/2022)     metFORMIN (GLUCOPHAGE XR) 750 MG 24 hr tablet Take 2 tablets (1,500 mg total) by mouth daily with breakfast. (Patient not taking: Reported on 07/03/2022) 180 tablet 1   methocarbamol (ROBAXIN) 500 MG tablet Take 1,000 mg by mouth 2 (two) times daily. (Patient not taking: Reported on 07/03/2022)     ramipril (ALTACE) 10 MG capsule Take 1 capsule (10 mg total) by mouth daily. (Patient not taking: Reported on 07/03/2022) 90 capsule 1   rosuvastatin (CRESTOR) 20 MG tablet Take 1 tablet (20 mg total) by mouth daily. (Patient not taking: Reported on 07/03/2022) 90 tablet 1   No facility-administered medications prior to visit.    ROS Review of Systems  Constitutional:  Negative for chills, diaphoresis, fatigue, fever and unexpected weight change.  HENT: Negative.    Eyes: Negative.   Respiratory:  Negative for cough, chest tightness, shortness of breath and wheezing.   Cardiovascular:  Negative for chest  pain, palpitations and leg swelling.  Gastrointestinal:  Negative for abdominal pain, constipation, diarrhea, nausea and vomiting.  Endocrine: Positive for polydipsia, polyphagia and polyuria. Negative for cold intolerance and heat intolerance.  Genitourinary: Negative.  Negative for difficulty urinating, scrotal swelling and testicular pain.       ++ED  Musculoskeletal: Negative.  Negative for arthralgias and myalgias.  Skin:  Positive for rash. Negative for color change, pallor and wound.  Neurological:  Negative for dizziness, weakness, light-headedness and headaches.  Hematological:  Negative for adenopathy. Does not  bruise/bleed easily.  Psychiatric/Behavioral: Negative.      Objective:  BP (!) 138/94 (BP Location: Left Arm, Patient Position: Sitting, Cuff Size: Normal)   Pulse 81   Temp 97.6 F (36.4 C) (Oral)   Resp 16   Ht 6' (1.829 m)   Wt 239 lb (108.4 kg)   SpO2 97%   BMI 32.41 kg/m   BP Readings from Last 3 Encounters:  07/03/22 (!) 138/94  01/02/22 128/72  04/29/21 126/78    Wt Readings from Last 3 Encounters:  07/03/22 239 lb (108.4 kg)  01/02/22 233 lb (105.7 kg)  04/29/21 239 lb (108.4 kg)    Physical Exam Vitals reviewed.  Constitutional:      General: He is not in acute distress.    Appearance: Normal appearance. He is ill-appearing. He is not toxic-appearing or diaphoretic.  HENT:     Nose: Nose normal.     Mouth/Throat:     Mouth: Mucous membranes are moist.  Eyes:     General: No scleral icterus.    Conjunctiva/sclera: Conjunctivae normal.  Cardiovascular:     Rate and Rhythm: Normal rate and regular rhythm.     Heart sounds: Normal heart sounds, S1 normal and S2 normal. No murmur heard.    No friction rub. No gallop.     Comments: EKG- NSR, 79 bpm Loss of voltage in V1 V2 is old No LVH, Q waves, or acute ST/T wave changes Pulmonary:     Effort: Pulmonary effort is normal.     Breath sounds: No stridor. No wheezing, rhonchi or rales.  Abdominal:     General: Abdomen is flat.     Palpations: There is no mass.     Tenderness: There is no abdominal tenderness. There is no guarding.     Hernia: No hernia is present.  Genitourinary:    Comments: DRE deferred at his request Musculoskeletal:     Cervical back: Neck supple.     Right lower leg: No edema.     Left lower leg: No edema.  Lymphadenopathy:     Cervical: No cervical adenopathy.  Skin:    General: Skin is warm and dry.     Findings: Rash present. No erythema or lesion.  Neurological:     General: No focal deficit present.     Mental Status: He is alert. Mental status is at baseline.   Psychiatric:        Mood and Affect: Mood normal.        Behavior: Behavior normal.        Thought Content: Thought content normal.     Lab Results  Component Value Date   WBC 5.0 07/03/2022   HGB 15.2 07/03/2022   HCT 44.5 07/03/2022   PLT 257.0 07/03/2022   GLUCOSE 273 (H) 07/03/2022   CHOL 188 07/03/2022   TRIG 76.0 07/03/2022   HDL 36.30 (L) 07/03/2022   LDLCALC 137 (H) 07/03/2022   ALT 25  07/03/2022   AST 17 07/03/2022   NA 137 07/03/2022   K 4.2 07/03/2022   CL 102 07/03/2022   CREATININE 0.81 07/03/2022   BUN 13 07/03/2022   CO2 27 07/03/2022   TSH 1.24 07/03/2022   PSA 1.08 07/03/2022   INR 1.00 05/02/2016   HGBA1C 10.7 (H) 07/03/2022   MICROALBUR 2.6 (H) 07/03/2022    No results found.  Assessment & Plan:   Hyperlipidemia with target LDL less than 100- Will restart the statin. -     Lipid panel; Future -     TSH; Future -     Hepatic function panel; Future -     Rosuvastatin Calcium; Take 1 tablet (20 mg total) by mouth daily.  Dispense: 90 tablet; Refill: 1  Diabetes 1.5, managed as type 1 (HCC)- Will try to get better control of his blood sugar. -     Microalbumin / creatinine urine ratio; Future -     Hemoglobin A1c; Future -     Basic metabolic panel; Future -     HM Diabetes Foot Exam -     Ambulatory referral to Endocrinology -     Amb Referral to Nutrition and Diabetic Education -     FreeStyle Libre 2 Reader; 1 Act by Does not apply route daily.  Dispense: 2 each; Refill: 5 -     FreeStyle Libre 2 Sensor; 1 Act by Does not apply route daily.  Dispense: 2 each; Refill: 5 -     Gvoke HypoPen 2-Pack; Inject 1 Act into the skin daily as needed.  Dispense: 2 mL; Refill: 5 -     Insulin Detemir; Inject 100 Units into the skin every morning.  Dispense: 90 mL; Refill: 1 -     Insulin Lispro; Inject 10 Units into the skin 3 (three) times daily with meals.  Dispense: 15 mL; Refill: 0 -     Ambulatory referral to Ophthalmology -     Pen Needles; 1 each  by Does not apply route in the morning, at noon, in the evening, and at bedtime. Use with insulin  Dispense: 400 each; Refill: 1 -     metFORMIN HCl ER; Take 2 tablets (1,500 mg total) by mouth daily with breakfast.  Dispense: 180 tablet; Refill: 0  Essential hypertension- Will restart the thiazide diuretic. -     TSH; Future -     Urinalysis, Routine w reflex microscopic; Future -     CBC with Differential/Platelet; Future -     Basic metabolic panel; Future -     Indapamide; Take 1 tablet (1.25 mg total) by mouth daily.  Dispense: 90 tablet; Refill: 0  Encounter for general adult medical examination with abnormal findings- Exam completed, labs reviewed, vaccines are UTD, cancer screenings addressed, patient education was given. -     PSA; Future -     HIV Antibody (routine testing w rflx); Future  Abnormal electrocardiogram (ECG) (EKG)- His EKG is unchanged.  Vitamin D deficiency disease -     VITAMIN D 25 Hydroxy (Vit-D Deficiency, Fractures); Future -     Cholecalciferol; Take 1 capsule (50,000 Units total) by mouth once a week.  Dispense: 12 capsule; Refill: 0  Rash and nonspecific skin eruption -     RPR; Future  Erectile dysfunction, unspecified erectile dysfunction type -     Sildenafil Citrate; Take 4 tablets (80 mg total) by mouth daily as needed.  Dispense: 60 tablet; Refill: 1  Tinea pedis of both feet -  Fluconazole; Take 2 tablets (300 mg total) by mouth once a week.  Dispense: 26 tablet; Refill: 0  Onychomycosis of multiple toenails with type 1 diabetes mellitus (HCC) -     Fluconazole; Take 2 tablets (300 mg total) by mouth once a week.  Dispense: 26 tablet; Refill: 0     Follow-up: Return in about 3 months (around 10/03/2022).  Sanda Linger, MD

## 2022-07-04 ENCOUNTER — Other Ambulatory Visit: Payer: Self-pay | Admitting: Internal Medicine

## 2022-07-04 ENCOUNTER — Telehealth: Payer: Self-pay | Admitting: Internal Medicine

## 2022-07-04 DIAGNOSIS — N529 Male erectile dysfunction, unspecified: Secondary | ICD-10-CM

## 2022-07-04 LAB — HIV ANTIBODY (ROUTINE TESTING W REFLEX): HIV 1&2 Ab, 4th Generation: NONREACTIVE

## 2022-07-04 LAB — RPR: RPR Ser Ql: NONREACTIVE

## 2022-07-04 NOTE — Telephone Encounter (Signed)
Patient called states he needs a prescription renewal for his sildenafil (VIAGRA) 100 MG tablet , also had questions about the blood pressure medicine he is on, he would like a call at [phone].

## 2022-07-04 NOTE — Telephone Encounter (Signed)
Forms has been placed in Providers office to be reviewed and signed.

## 2022-07-06 MED ORDER — SILDENAFIL CITRATE 20 MG PO TABS
80.0000 mg | ORAL_TABLET | Freq: Every day | ORAL | 1 refills | Status: AC | PRN
Start: 2022-07-06 — End: ?

## 2022-07-07 DIAGNOSIS — B353 Tinea pedis: Secondary | ICD-10-CM | POA: Insufficient documentation

## 2022-07-07 DIAGNOSIS — E1069 Type 1 diabetes mellitus with other specified complication: Secondary | ICD-10-CM | POA: Insufficient documentation

## 2022-07-07 MED ORDER — FLUCONAZOLE 150 MG PO TABS
300.0000 mg | ORAL_TABLET | ORAL | 0 refills | Status: AC
Start: 2022-07-07 — End: 2022-10-07

## 2022-07-15 NOTE — Addendum Note (Signed)
Addended by: Darryll Capers on: 07/15/2022 02:32 PM   Modules accepted: Orders

## 2022-09-24 ENCOUNTER — Telehealth: Payer: Self-pay | Admitting: Internal Medicine

## 2022-09-24 NOTE — Telephone Encounter (Signed)
Pt has been scheduled for 8/13 @ 2.20pm.

## 2022-09-24 NOTE — Telephone Encounter (Signed)
Per clinical manager, Lorelle Gibbs RN,  this CMA was informed to addend note of conversation with pt during the time of scheduling.   Pt had a rude demeanor when told he would need a 16mo f/u OV for Rx refill. Pt used raised voice and profanity stating " I just need my damn medication" as this CMA was attempted to get him on PCP schedule. This CMA stated that she would not be yelled at as she is only trying to assist in getting him the Rx.

## 2022-09-24 NOTE — Telephone Encounter (Signed)
Prescription Request  09/24/2022  LOV: 07/03/2022  What is the name of the medication or equipment? indapamide  Have you contacted your pharmacy to request a refill? Yes   Which pharmacy would you like this sent to?   [facility]  Patient notified that their request is being sent to the clinical staff for review and that they should receive a response within 2 business days.   Please advise at Mobile [phone] (mobile)

## 2022-09-29 ENCOUNTER — Telehealth: Payer: Self-pay | Admitting: Internal Medicine

## 2022-09-29 NOTE — Telephone Encounter (Signed)
Patient requested a copy of his medication list be sent to [facility]. He said it is needed for his insulin and that he may be changing to them as a provider. Best callback is [phone].

## 2022-09-29 NOTE — Telephone Encounter (Signed)
Med list has been faxed

## 2022-09-29 NOTE — Telephone Encounter (Signed)
Patient called back states fax number is [phone].

## 2022-09-30 ENCOUNTER — Ambulatory Visit: Payer: PRIVATE HEALTH INSURANCE | Admitting: Internal Medicine

## 2022-12-21 ENCOUNTER — Other Ambulatory Visit: Payer: Self-pay | Admitting: Internal Medicine

## 2022-12-21 DIAGNOSIS — I10 Essential (primary) hypertension: Secondary | ICD-10-CM
